# Patient Record
Sex: Female | Born: 1995
Health system: Southern US, Community
[De-identification: ages and names within clinical notes are randomized; demographics above are authoritative.]

## PROBLEM LIST (undated history)

## (undated) ENCOUNTER — Inpatient Hospital Stay (HOSPITAL_COMMUNITY): Payer: Self-pay

## (undated) DIAGNOSIS — F419 Anxiety disorder, unspecified: Secondary | ICD-10-CM

## (undated) DIAGNOSIS — N926 Irregular menstruation, unspecified: Secondary | ICD-10-CM

## (undated) DIAGNOSIS — O139 Gestational [pregnancy-induced] hypertension without significant proteinuria, unspecified trimester: Secondary | ICD-10-CM

## (undated) DIAGNOSIS — L700 Acne vulgaris: Secondary | ICD-10-CM

## (undated) DIAGNOSIS — F329 Major depressive disorder, single episode, unspecified: Secondary | ICD-10-CM

## (undated) DIAGNOSIS — Z8619 Personal history of other infectious and parasitic diseases: Secondary | ICD-10-CM

## (undated) DIAGNOSIS — F32A Depression, unspecified: Secondary | ICD-10-CM

## (undated) DIAGNOSIS — A749 Chlamydial infection, unspecified: Secondary | ICD-10-CM

## (undated) DIAGNOSIS — J301 Allergic rhinitis due to pollen: Secondary | ICD-10-CM

## (undated) DIAGNOSIS — K219 Gastro-esophageal reflux disease without esophagitis: Secondary | ICD-10-CM

## (undated) HISTORY — DX: Gestational (pregnancy-induced) hypertension without significant proteinuria, unspecified trimester: O13.9

## (undated) HISTORY — DX: Irregular menstruation, unspecified: N92.6

## (undated) HISTORY — DX: Anxiety disorder, unspecified: F41.9

## (undated) HISTORY — DX: Acne vulgaris: L70.0

## (undated) HISTORY — DX: Personal history of other infectious and parasitic diseases: Z86.19

## (undated) HISTORY — DX: Major depressive disorder, single episode, unspecified: F32.9

## (undated) HISTORY — DX: Depression, unspecified: F32.A

## (undated) HISTORY — DX: Gastro-esophageal reflux disease without esophagitis: K21.9

## (undated) HISTORY — DX: Allergic rhinitis due to pollen: J30.1

## (undated) HISTORY — DX: Chlamydial infection, unspecified: A74.9

## (undated) HISTORY — PX: TOE SURGERY: SHX1073

---
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
## 2014-01-04 ENCOUNTER — Ambulatory Visit (INDEPENDENT_AMBULATORY_CARE_PROVIDER_SITE_OTHER): Payer: Commercial Managed Care - PPO | Admitting: Nurse Practitioner

## 2014-01-04 ENCOUNTER — Encounter: Payer: Self-pay | Admitting: Nurse Practitioner

## 2014-01-04 ENCOUNTER — Ambulatory Visit: Payer: Commercial Managed Care - PPO | Admitting: Family Medicine

## 2014-01-04 VITALS — BP 80/50 | HR 66 | Temp 98.2°F | Ht 63.0 in | Wt 118.5 lb

## 2014-01-04 DIAGNOSIS — L0291 Cutaneous abscess, unspecified: Secondary | ICD-10-CM

## 2014-01-04 DIAGNOSIS — L0390 Cellulitis, unspecified: Secondary | ICD-10-CM

## 2014-01-04 DIAGNOSIS — R55 Syncope and collapse: Secondary | ICD-10-CM

## 2014-01-04 LAB — POCT URINALYSIS DIPSTICK
BILIRUBIN UA: NEGATIVE
Blood, UA: NEGATIVE
GLUCOSE UA: NEGATIVE
Ketones, UA: NEGATIVE
NITRITE UA: NEGATIVE
UROBILINOGEN UA: 0.2
pH, UA: 5.5

## 2014-01-04 MED ORDER — MUPIROCIN CALCIUM 2 % EX CREA: 1.0000 "application " | TOPICAL_CREAM | Freq: Two times a day (BID) | CUTANEOUS | Status: DC

## 2014-01-04 NOTE — Progress Notes (Signed)
   Subjective:    Patient ID: Heidi Shields, female    DOB: [birth year], 18 y.o.   MRN: [id number]  Rash This is a new problem. The current episode started yesterday (co-worker noticed lesion in ear last week.). The problem has been gradually worsening since onset. The affected locations include the right ear (pinna). The rash is characterized by redness, swelling and blistering. She was exposed to nothing. Pertinent negatives include no congestion, fatigue, fever or shortness of breath. Past treatments include nothing. There is no history of eczema. (Acne, taking doxycycline)      Review of Systems  Constitutional: Negative for fever, chills and fatigue.  HENT: Negative for congestion.   Respiratory: Negative for shortness of breath and wheezing.   Skin: Positive for rash.  Hematological: Negative for adenopathy.       Objective:   Physical Exam  Vitals reviewed. Constitutional: She is oriented to person, place, and time. She appears well-developed and well-nourished. She appears distressed (pt was not distressed until end of visit-i took paper work in room & asked if I could palpate lymph nodes-she became light-headed and I assisted her to the floor. She was unresponsive for a few seconds.).  HENT:  Head: Normocephalic and atraumatic.  Right Ear: Hearing normal.  Left Ear: Hearing normal.  Ears:  Moderate amount creumen both ears, medium brown, soft. Unable to fully visualize TM.  Eyes: Conjunctivae are normal. Right eye exhibits no discharge. Left eye exhibits no discharge.  Neck: Normal range of motion. Neck supple. No thyromegaly present.  No pre or post auricular LAD.  Cardiovascular: Normal rate, regular rhythm and normal heart sounds.   No murmur heard. Hypotensive: 90/50-80/50. Not tachycardic.  Pulmonary/Chest: Effort normal and breath sounds normal. No respiratory distress. She has no wheezes.  Genitourinary:  Dehydrated: SG >1.030, urine dark.  Lymphadenopathy:    She has no cervical adenopathy.  Neurological: She is alert and oriented to person, place, and time.  Appears to have had parasympathetic nerve response: light headed, followed by pallor, syncope -3-5 seconds, diaphoresis. Episode lasted about 10 minutes. POC CG 96. Pt given 1 piece candy and mountain dew. BP dropped to 80/50. Pulse initially dropped to 55, then back to 65 within 3-5 minutes minutes. Urine SG >1.030.  Skin: Skin is warm and dry. Rash noted.  See ear eval for lesion with cellulitis. Flaky skin noted R ear & 1 patch .5cm L forearm & R elbow.   Psychiatric: She has a normal mood and affect. Her behavior is normal. Thought content normal.          Assessment & Plan:  1. Cellulitis R pinna, looks like infected blackhead w/psoriasis overlap. - mupirocin cream (BACTROBAN) 2 %; Apply 1 application topically 2 (two) times daily.  Dispense: 15 g; Refill: 0  2. Syncope and collapse Pt became light headed after exam. BP 90/50 on arrival P 74. SG >1.030. POC Glucose 96. 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 Full recovery after 15 minutes.  DD: Arrythmia, SE of OBCP, anemia   - POCT CBG monitoring: 96 - POCT Urinalysis Dipstick: SG >1.030

## 2014-01-04 NOTE — Progress Notes (Signed)
Pre-visit discussion using our clinic review tool. No additional management support is needed unless otherwise documented below in the visit note.  

## 2014-01-04 NOTE — Patient Instructions (Addendum)
I think you have a pimple that has gotten infected. Use ointment 2 to 3 times daily on lesion. Wash ear at least once daily with soapy q tip-get in all crevices. You may apply 2 to 5% benzoyl peroxide in ear crevices where you have blackheads.   I think you passed out today for a combination reasons: dehydration and anxiety/fear that led to a parasympathetic nervous response. I think you can avoid this in the future if you stay hydrated. You need 4, 16 oz water/fluids daily (does not include caffienated tea or soda). If hydration does not correct the problem of low blood pressure we will run some tests. Let's see you back Monday. Nice to meet you!  Cellulitis Cellulitis is an infection of the skin and the tissue beneath it. The infected area is usually red and tender. Cellulitis occurs most often in the arms and lower legs.  CAUSES  Cellulitis is caused by bacteria that enter the skin through cracks or cuts in the skin. The most common types of bacteria that cause cellulitis are Staphylococcus and Streptococcus. SYMPTOMS   Redness and warmth.  Swelling.  Tenderness or pain.  Fever. DIAGNOSIS  Your caregiver can usually determine what is wrong based on a physical exam. Blood tests may also be done. TREATMENT  Treatment usually involves taking an antibiotic medicine. HOME CARE INSTRUCTIONS   Take your antibiotics as directed. Finish them even if you start to feel better.  Keep the infected arm or leg elevated to reduce swelling.  Apply a warm cloth to the affected area up to 4 times per day to relieve pain.  Only take over-the-counter or prescription medicines for pain, discomfort, or fever as directed by your caregiver.  Keep all follow-up appointments as directed by your caregiver. SEEK MEDICAL CARE IF:   You notice red streaks coming from the infected area.  Your red area gets larger or turns dark in color.  Your bone or joint underneath the infected area becomes painful after  the skin has healed.  Your infection returns in the same area or another area.  You notice a swollen bump in the infected area.  You develop new symptoms. SEEK IMMEDIATE MEDICAL CARE IF:   You have a fever.  You feel very sleepy.  You develop vomiting or diarrhea.  You have a general ill feeling (malaise) with muscle aches and pains. MAKE SURE YOU:   Understand these instructions.  Will watch your condition.  Will get help right away if you are not doing well or get worse. Document Released: 09/10/2005 Document Revised: 06/01/2012 Document Reviewed: 02/16/2012 [facility] Patient Information 2014 [place].  Dehydration, Adult Dehydration is when you lose more fluids from the body than you take in. Vital organs like the kidneys, brain, and heart cannot function without a proper amount of fluids and salt. Any loss of fluids from the body can cause dehydration.  CAUSES   Vomiting.  Diarrhea.  Excessive sweating.  Excessive urine output.  Fever. SYMPTOMS  Mild dehydration  Thirst.  Dry lips.  Slightly dry mouth. Moderate dehydration  Very dry mouth.  Sunken eyes.  Skin does not bounce back quickly when lightly pinched and released.  Dark urine and decreased urine production.  Decreased tear production.  Headache. Severe dehydration  Very dry mouth.  Extreme thirst.  Rapid, weak pulse (more than 100 beats per minute at rest).  Cold hands and feet.  Not able to sweat in spite of heat and temperature.  Rapid breathing.  Blue lips.  Confusion and lethargy.  Difficulty being awakened.  Minimal urine production.  No tears. DIAGNOSIS  Your caregiver will diagnose dehydration based on your symptoms and your exam. Blood and urine tests will help confirm the diagnosis. The diagnostic evaluation should also identify the cause of dehydration. TREATMENT  Treatment of mild or moderate dehydration can often be done at home by increasing the  amount of fluids that you drink. It is best to drink small amounts of fluid more often. Drinking too much at one time can make vomiting worse. Refer to the home care instructions below. Severe dehydration needs to be treated at the hospital where you will probably be given intravenous (IV) fluids that contain water and electrolytes. HOME CARE INSTRUCTIONS   Ask your caregiver about specific rehydration instructions.  Drink enough fluids to keep your urine clear or pale yellow.  Drink small amounts frequently if you have nausea and vomiting.  Eat as you normally do.  Avoid:  Foods or drinks high in sugar.  Carbonated drinks.  Juice.  Extremely hot or cold fluids.  Drinks with caffeine.  Fatty, greasy foods.  Alcohol.  Tobacco.  Overeating.  Gelatin desserts.  Wash your hands well to avoid spreading bacteria and viruses.  Only take over-the-counter or prescription medicines for pain, discomfort, or fever as directed by your caregiver.  Ask your caregiver if you should continue all prescribed and over-the-counter medicines.  Keep all follow-up appointments with your caregiver. SEEK MEDICAL CARE IF:  You have abdominal pain and it increases or stays in one area (localizes).  You have a rash, stiff neck, or severe headache.  You are irritable, sleepy, or difficult to awaken.  You are weak, dizzy, or extremely thirsty. SEEK IMMEDIATE MEDICAL CARE IF:   You are unable to keep fluids down or you get worse despite treatment.  You have frequent episodes of vomiting or diarrhea.  You have blood or green matter (bile) in your vomit.  You have blood in your stool or your stool looks black and tarry.  You have not urinated in 6 to 8 hours, or you have only urinated a small amount of very dark urine.  You have a fever.  You faint. MAKE SURE YOU:   Understand these instructions.  Will watch your condition.  Will get help right away if you are not doing well or  get worse. Document Released: 12/01/2005 Document Revised: 02/23/2012 Document Reviewed: 07/21/2011 [facility] Patient Information 2014 [place], [place].

## 2014-01-05 ENCOUNTER — Telehealth: Payer: Self-pay | Admitting: Nurse Practitioner

## 2014-01-05 ENCOUNTER — Other Ambulatory Visit: Payer: Self-pay | Admitting: *Deleted

## 2014-01-05 DIAGNOSIS — L0390 Cellulitis, unspecified: Secondary | ICD-10-CM

## 2014-01-05 MED ORDER — MUPIROCIN CALCIUM 2 % EX CREA: 1.0000 "application " | TOPICAL_CREAM | Freq: Two times a day (BID) | CUTANEOUS | Status: DC

## 2014-01-05 NOTE — Telephone Encounter (Signed)
Called pharmacy who stated that Bactroban cream rx from yesterday needs a PA. Switched rx to Bactroban ointment 22 g. If the ointment rx is not approved, pharmacy will let us know.

## 2014-01-05 NOTE — Telephone Encounter (Signed)
Patient was not able to pick up the medication for her ear. The pharmacy said they faxed us something & they are waiting for more information from our office. Please contact patient's mother to let her know if there is a problem with her insurance. She said it is okay to prescribe something else if you need to.

## 2014-01-05 NOTE — Telephone Encounter (Addendum)
Called pharmacy who stated that Bactroban rx from yesterday need s a PA. Switch rx to ointment 22 g. See other message.

## 2014-01-09 ENCOUNTER — Ambulatory Visit (INDEPENDENT_AMBULATORY_CARE_PROVIDER_SITE_OTHER): Payer: Commercial Managed Care - PPO | Admitting: Family Medicine

## 2014-01-09 ENCOUNTER — Encounter: Payer: Self-pay | Admitting: Family Medicine

## 2014-01-09 ENCOUNTER — Ambulatory Visit: Payer: Commercial Managed Care - PPO | Admitting: Family Medicine

## 2014-01-09 VITALS — BP 113/75 | HR 81 | Temp 99.2°F | Resp 16 | Ht 63.0 in | Wt 122.0 lb

## 2014-01-09 DIAGNOSIS — R55 Syncope and collapse: Secondary | ICD-10-CM

## 2014-01-09 DIAGNOSIS — L089 Local infection of the skin and subcutaneous tissue, unspecified: Secondary | ICD-10-CM

## 2014-01-09 DIAGNOSIS — H6120 Impacted cerumen, unspecified ear: Secondary | ICD-10-CM

## 2014-01-09 NOTE — Progress Notes (Signed)
OFFICE NOTE  01/09/2014  CC:  Chief Complaint  Patient presents with  . Follow-up    5 days      HPI: Patient is a [age] y.o. Caucasian female who is here for f/u right ear infected acne vulgaris lesion. Bactroban application almost daily--makes it itch.  She never had any pain.   Occ pain in right ear lately, feels clogged a lot. Doing better on doxycycline for her acne since 08/2013.  Has hx of feeling onset of lightheadedness, visual fields narrowing to tunnels, then passing out briefly.  These episodes consistently occur during times of stress/fear. Mom says she is active but when she comes home she "just lies on the couch".  Pt admits she feels tired and just wants to lay around.  Menses are regular-occurring for the most part, no excessive bleeding. She denies sexual activity and says there is no chance of her being pregnant.  Pertinent PMH:  Past Medical History  Diagnosis Date  . History of mononucleosis syndrome 2012  . Irregular menstrual cycle   . Acne vulgaris   . Hay fever     MEDS:  Outpatient Prescriptions Prior to Visit  Medication Sig Dispense Refill  . doxycycline (VIBRAMYCIN) 100 MG capsule Take 1 capsule (100 mg total) by mouth 2 (two) times daily.  60 capsule  6  . norethindrone-ethinyl estradiol (MICROGESTIN,JUNEL,LOESTRIN) 1-20 MG-MCG tablet 1 tab po qd x 21d, then no tabs for 7d, then start next pack  1 Package  11  . ibuprofen (ADVIL,MOTRIN) 200 MG tablet Take 400 mg by mouth once as needed. For pain      . mupirocin cream (BACTROBAN) 2 % Apply 1 application topically 2 (two) times daily.  15 g  0   No facility-administered medications prior to visit.    PE: Blood pressure 113/75, pulse 81, temperature 99.2 F (37.3 C), temperature source Temporal, resp. rate 16, height 5\' 3"  (1.6 m), weight 122 lb (55.339 kg), last menstrual period 12/21/2013, SpO2 100.00%. Gen: Alert, well appearing.  Patient is oriented to person, place, time, and situation. No  pallor or jaundice. ENT: Ears: Right ear with some flesh colored, slightly crusty papules near entrance to EAC, without erythema.  NO pustule or vesicle or tenderness.  Skin of face: rare small pimple on chin--non-erythematous. EACs show moderate cerumen on left, 75%-90% occlusion on right--minimal moist/brown cerumen removed with curette, then pt's ear had to be irrigated by nurse.  Most of the cerumen came out with this.  TMs with good light reflex and landmarks bilaterally.  Eyes: no injection, icteris, swelling, or exudate.  EOMI, PERRLA. Nose: no drainage or turbinate edema/swelling.  No injection or focal lesion.  Mouth: lips without lesion/swelling.  Oral mucosa pink and moist.  Dentition intact and without obvious caries or gingival swelling.  Oropharynx without erythema, exudate, or swelling.  Neck - No masses or thyromegaly or limitation in range of motion CV: RRR, no m/r/g.   LUNGS: CTA bilat, nonlabored resps, good aeration in all lung fields. EXT: no clubbing, cyanosis, or edema.  Radial pulses 2+ bilat.   IMPRESSION AND PLAN:  1) Infected acne pustule: resolving nicely with bactroban.  Finish tx with this. Continue vibramycin 100mg  bid for acne.  2) Cerumen impaction: improved ear discomfort after irrigation today in office.    3) Vasovagal syncope: discussed relatively benign nature of this disorder.  Discussed importance of drinking clear fluids regularly/staying hydrated. Encouraged her to start increasing activity level.  FOLLOW UP: prn

## 2014-03-02 ENCOUNTER — Encounter: Payer: Self-pay | Admitting: Family Medicine

## 2014-03-02 ENCOUNTER — Ambulatory Visit (INDEPENDENT_AMBULATORY_CARE_PROVIDER_SITE_OTHER): Payer: Commercial Managed Care - PPO | Admitting: Family Medicine

## 2014-03-02 VITALS — BP 107/71 | HR 77 | Temp 98.0°F | Resp 18 | Ht 63.0 in | Wt 123.0 lb

## 2014-03-02 DIAGNOSIS — L700 Acne vulgaris: Secondary | ICD-10-CM

## 2014-03-02 DIAGNOSIS — Z309 Encounter for contraceptive management, unspecified: Secondary | ICD-10-CM

## 2014-03-02 DIAGNOSIS — L708 Other acne: Secondary | ICD-10-CM

## 2014-03-02 NOTE — Progress Notes (Signed)
Pre visit review using our clinic review tool, if applicable. No additional management support is needed unless otherwise documented below in the visit note. 

## 2014-03-02 NOTE — Progress Notes (Signed)
OFFICE NOTE  03/02/2014  CC:  Chief Complaint  Patient presents with  . Follow-up     HPI: Patient is a 18 y.o. Caucasian female who is here for routine f/u for contraceptive mgmt, acne, hx of irregular menses. Feeling well.  Menses regular, not heavy.  She mentions no HA problems lately. Acne has cleared up nicely despite the fact that she takes her minocycline only a few times per week.  Pertinent PMH:  Past medical, surgical, social, and family history reviewed and no changes are noted since last office visit. Pt does not smoke.  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  MEDS:  Outpatient Prescriptions Prior to Visit  Medication Sig Dispense Refill  . doxycycline (VIBRAMYCIN) 100 MG capsule Take 1 capsule (100 mg total) by mouth 2 (two) times daily.  60 capsule  6  . norethindrone-ethinyl estradiol (MICROGESTIN,JUNEL,LOESTRIN) 1-20 MG-MCG tablet 1 tab po qd x 21d, then no tabs for 7d, then start next pack  1 Package  11  . ibuprofen (ADVIL,MOTRIN) 200 MG tablet Take 400 mg by mouth once as needed. For pain      . mupirocin cream (BACTROBAN) 2 % Apply 1 application topically 2 (two) times daily.  15 g  0   No facility-administered medications prior to visit.    PE: Blood pressure 107/71, pulse 77, temperature 98 F (36.7 C), temperature source Oral, resp. rate 18, height 5\' 3"  (1.6 m), weight 123 lb (55.792 kg), SpO2 100.00%. Gen: Alert, well appearing.  Patient is oriented to person, place, time, and situation. Face: no significant acne CV: RRR, no m/r/g.   LUNGS: CTA bilat, nonlabored resps, good aeration in all lung fields.   IMPRESSION AND PLAN:  1) OCP management: doing well. Therapeutic expectations and side effect profile of medication discussed today.  Patient's questions answered.  2) Acne: quiescent. May do trial completely off of doxy.  Restart previously prescribed topical acne therapy if acne begins to  return, then may restart doxy as 2nd line tx if topical not effective in 6-8wk trial.  An After Visit Summary was printed and given to the patient.  FOLLOW UP: 6 mo

## 2014-03-03 ENCOUNTER — Encounter: Payer: Self-pay | Admitting: Family Medicine

## 2014-03-24 ENCOUNTER — Telehealth: Payer: Self-pay | Admitting: Family Medicine

## 2014-03-24 NOTE — Telephone Encounter (Signed)
[facility]  I had already spoke to the pharmacist about this the last time they were in the office so the medication should be correct now.

## 2014-03-24 NOTE — Telephone Encounter (Signed)
Patients mother called because patient is wanting to have a birth control called in that includes the "sugar pills". Currently the patients pill pack only has 3 weeks worth of pills and she wants to have the sugar pills to take during her period week, call mother with questions, [facility] [place] and will start a new pack of Birth control on sunday

## 2014-03-24 NOTE — Telephone Encounter (Signed)
[facility]  BC is now the 28 day / month.  Patient's mom aware.

## 2014-04-19 ENCOUNTER — Other Ambulatory Visit: Payer: Self-pay

## 2014-04-19 MED ORDER — NORETHINDRONE ACET-ETHINYL EST 1-20 MG-MCG PO TABS: ORAL_TABLET | ORAL | Status: DC

## 2014-08-31 ENCOUNTER — Ambulatory Visit: Payer: Commercial Managed Care - PPO | Admitting: Family Medicine

## 2014-11-23 ENCOUNTER — Telehealth: Payer: Self-pay | Admitting: Family Medicine

## 2014-11-23 NOTE — Telephone Encounter (Signed)
Patient is calling about her birth control. She is asking why when she asks at the pharmacy about her refills they say she has unlimited but every time she needs them she has to call here. Please call patient back on her cell.

## 2014-11-23 NOTE — Telephone Encounter (Signed)
Patient called back & she found another Rx. She had an old Rx #. Patient said she doesn't need anyone to call her back.

## 2015-03-16 ENCOUNTER — Other Ambulatory Visit: Payer: Self-pay | Admitting: Family Medicine

## 2015-03-16 NOTE — Telephone Encounter (Signed)
I have already sent the message to Dr. Milinda CaveMcGowen.  I am awaiting response.

## 2015-03-16 NOTE — Telephone Encounter (Signed)
[facility] [place], patient has to start taking Rx on Sunday.

## 2015-03-16 NOTE — Telephone Encounter (Signed)
I will do RF of this for 2 more months, but I haven't seen her in a year so she needs to come in for a follow up visit sometime in the next 2 mo.-thx

## 2015-05-10 ENCOUNTER — Other Ambulatory Visit: Payer: Self-pay | Admitting: Family Medicine

## 2015-05-11 ENCOUNTER — Other Ambulatory Visit: Payer: Self-pay | Admitting: Family Medicine

## 2015-05-11 MED ORDER — NORETHINDRONE ACET-ETHINYL EST 1-20 MG-MCG PO TABS: ORAL_TABLET | ORAL | Status: DC

## 2015-05-11 NOTE — Telephone Encounter (Signed)
Pt advised and voiced understanding.   

## 2015-05-11 NOTE — Telephone Encounter (Signed)
Pls notify pt that I am doing a 1 mo supply with 2 RF's and she must f/u in office before she runs out of this supply of OCPs (I won't RF any further meds after this until f/u in office).

## 2015-05-11 NOTE — Telephone Encounter (Signed)
Please see message below. LOV: 03/02/14, no up coming ov, last written: 03/16/15 w/ 1RF. Please advise. Thanks.

## 2015-05-11 NOTE — Telephone Encounter (Signed)
[facility] & [facility] told her that she doesn't have any refills of Microgestin 1-20MG . They did the same thing last month too. Patient needs to start taking Rx on Sunday 5/29.

## 2015-05-30 ENCOUNTER — Ambulatory Visit (INDEPENDENT_AMBULATORY_CARE_PROVIDER_SITE_OTHER): Payer: Commercial Managed Care - PPO | Admitting: Family Medicine

## 2015-05-30 ENCOUNTER — Encounter: Payer: Self-pay | Admitting: Family Medicine

## 2015-05-30 VITALS — BP 109/70 | HR 89 | Temp 99.0°F | Resp 16 | Wt 122.0 lb

## 2015-05-30 DIAGNOSIS — N926 Irregular menstruation, unspecified: Secondary | ICD-10-CM

## 2015-05-30 DIAGNOSIS — Z3009 Encounter for other general counseling and advice on contraception: Secondary | ICD-10-CM

## 2015-05-30 MED ORDER — NORETHINDRONE ACET-ETHINYL EST 1-20 MG-MCG PO TABS: ORAL_TABLET | ORAL | Status: DC

## 2015-05-30 NOTE — Progress Notes (Signed)
OFFICE NOTE  05/30/2015  CC:  Chief Complaint  Patient presents with  . Follow-up    F/U meds. Pt is not fasting.      HPI: Patient is a 19 y.o. Caucasian female who is here for [facility] management f/u. LMP was 3 wks ago.  This came at the expected time, menses with regular intervals between them, light amount of bleeding x 4-5 days.  Has minimal dysmenorrhea sx's.  Says she is sexually active, uses no back up contraception, says there is no chance she is pregnant.  Pertinent PMH:  Past Medical History  Diagnosis Date  . History of mononucleosis syndrome 2012  . Irregular menstrual cycle   . Acne vulgaris   . 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   HS grad: [employer].   Lives with mom, dad, 2 sisters, and 1 brother in [place], [place].   Good student.   No extracurricular activities since stopping soccer in 10th grade.   No T/A/Ds.    MEDS: Note: she takes microgestin 1-20 x 21d, then takes 7d of sugar pills (NOT continuous hormone as listed below.   Not on doxycycline or bactroban listed below.  Outpatient Prescriptions Prior to Visit  Medication Sig Dispense Refill  . ibuprofen (ADVIL,MOTRIN) 200 MG tablet Take 400 mg by mouth once as needed. For pain    . norethindrone-ethinyl estradiol (MICROGESTIN) 1-20 MG-MCG tablet TAKE ONE TABLET BY MOUTH ONCE DAILY CONTINUOUSLY 21 tablet 2  . doxycycline (VIBRAMYCIN) 100 MG capsule Take 1 capsule (100 mg total) by mouth 2 (two) times daily. (Patient not taking: Reported on 05/30/2015) 60 capsule 6  . mupirocin cream (BACTROBAN) 2 % Apply 1 application topically 2 (two) times daily. (Patient not taking: Reported on 05/30/2015) 15 g 0   No facility-administered medications prior to visit.    PE: Blood pressure 109/70, pulse 89, temperature 99 F (37.2 C), temperature source Oral, resp. rate 16, weight 122 lb (55.339 kg), SpO2 98 %. Gen: Alert, well appearing.  Patient is  oriented to person, place, time, and situation. AFFECT: pleasant, lucid thought and speech. No further exam today.  IMPRESSION AND PLAN:  Contraceptive counseling, hx of metrorrhagia. Doing great on loestrin 1/20. Will send in new rx that specifies that I want her to have 21d hormone pills + 7d sugar pills (as opposed to 21d hormone pills and NO pills to take during menses week). Advised pt that her first pap is to be after she turns 19 yrs old.  An After Visit Summary was printed and given to the patient.  FOLLOW UP: 1 yr

## 2015-05-30 NOTE — Progress Notes (Signed)
Pre visit review using our clinic review tool, if applicable. No additional management support is needed unless otherwise documented below in the visit note. 

## 2015-06-06 ENCOUNTER — Telehealth: Payer: Self-pay | Admitting: Family Medicine

## 2015-06-06 NOTE — Telephone Encounter (Signed)
Pt called stating that her BC still does not contain sugar pills.   Spoke to pharmacist and she states the only microgestin that contains sugar pills is microgestin FE.  Can patient take that instead of regular?  Please advise.

## 2015-06-11 NOTE — Telephone Encounter (Signed)
YES--ok to take microgestin FE.-thx

## 2015-06-11 NOTE — Telephone Encounter (Signed)
Spoke to pharmacist and had Rx changed.  Pt aware.

## 2015-07-19 ENCOUNTER — Telehealth: Payer: Self-pay | Admitting: *Deleted

## 2015-07-19 NOTE — Telephone Encounter (Signed)
Pt called stating that she started the new BC with sugar pills. She stated that since she has started the new [facility] but her cycle still has not stopped. She stated that it will slow down then pick back up. She stated that this is the 3rd week of her cycle. She wants to know what why this is happening and what Dr. Milinda Cave recommends. Please advise. Thanks.

## 2015-07-20 NOTE — Telephone Encounter (Signed)
Pt advised and voiced understanding.   

## 2015-07-20 NOTE — Telephone Encounter (Signed)
Pt LMOM on 07/20/15 at 9:19am, calling back in regards to message below please advise. Thanks.

## 2015-07-20 NOTE — Telephone Encounter (Signed)
Tell her she needs to check an OTC urine pregnancy test if she has not already done so. Reassure her that there is nothing different about her most recently prescribed OCP, and that some periods are simply irregular for unclear reasons (stress is most common one).  Tell her to continue the pills like she has been directed to take them and if the bleeding gets heavy she can come in and we can check a blood count.  Otherwise she will have to give this a cycle or two to see if it straightens out.-thx

## 2016-03-31 ENCOUNTER — Telehealth: Payer: Self-pay | Admitting: Family Medicine

## 2016-03-31 MED ORDER — NORETHINDRONE ACET-ETHINYL EST 1-20 MG-MCG PO TABS: ORAL_TABLET | ORAL | Status: DC
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
## 2016-03-31 NOTE — Telephone Encounter (Signed)
Spoke to pharmacy they stated that pt has one refill left on her current [facility]. They will go ahead and get this ready for her but did state that she will have to pay out of pocket since it is an early refill. Pt has apt in June to see Dr. Milinda CaveMcGowen and needs another month Rxed to her pharmacy to get her by til then. Rx sent. Pt advised and voiced understanding.

## 2016-05-29 ENCOUNTER — Ambulatory Visit (INDEPENDENT_AMBULATORY_CARE_PROVIDER_SITE_OTHER): Payer: Commercial Managed Care - PPO | Admitting: Family Medicine

## 2016-05-29 ENCOUNTER — Encounter: Payer: Self-pay | Admitting: Family Medicine

## 2016-05-29 VITALS — BP 112/77 | HR 76 | Temp 98.5°F | Resp 16 | Ht 63.0 in | Wt 143.5 lb

## 2016-05-29 DIAGNOSIS — R635 Abnormal weight gain: Secondary | ICD-10-CM | POA: Diagnosis not present

## 2016-05-29 DIAGNOSIS — R5382 Chronic fatigue, unspecified: Secondary | ICD-10-CM | POA: Diagnosis not present

## 2016-05-29 DIAGNOSIS — Z0000 Encounter for general adult medical examination without abnormal findings: Secondary | ICD-10-CM | POA: Insufficient documentation

## 2016-05-29 LAB — COMPREHENSIVE METABOLIC PANEL
ALK PHOS: 50 U/L (ref 39–117)
ALT: 14 U/L (ref 0–35)
AST: 13 U/L (ref 0–37)
Albumin: 4.6 g/dL (ref 3.5–5.2)
BILIRUBIN TOTAL: 0.4 mg/dL (ref 0.2–1.2)
BUN: 13 mg/dL (ref 6–23)
CO2: 30 mEq/L (ref 19–32)
CREATININE: 0.63 mg/dL (ref 0.40–1.20)
Calcium: 9.9 mg/dL (ref 8.4–10.5)
Chloride: 104 mEq/L (ref 96–112)
GFR: 127.4 mL/min (ref >60.00)
GLUCOSE: 78 mg/dL (ref 70–99)
Potassium: 4 mEq/L (ref 3.5–5.1)
SODIUM: 140 meq/L (ref 135–145)
TOTAL PROTEIN: 7.8 g/dL (ref 6.0–8.3)

## 2016-05-29 LAB — CBC WITH DIFFERENTIAL/PLATELET
BASOS ABS: 0 10*3/uL (ref 0.0–0.1)
Basophils Relative: 0.5 % (ref 0.0–3.0)
EOS ABS: 0.4 10*3/uL (ref 0.0–0.7)
EOS PCT: 5.7 % — AB (ref 0.0–5.0)
HCT: 43.4 % (ref 36.0–46.0)
HEMOGLOBIN: 14.9 g/dL (ref 12.0–15.0)
Lymphocytes Relative: 26 % (ref 12.0–46.0)
Lymphs Abs: 2 10*3/uL (ref 0.7–4.0)
MCHC: 34.2 g/dL (ref 30.0–36.0)
MCV: 87.5 fl (ref 78.0–100.0)
MONO ABS: 0.7 10*3/uL (ref 0.1–1.0)
Monocytes Relative: 9.4 % (ref 3.0–12.0)
Neutro Abs: 4.5 10*3/uL (ref 1.4–7.7)
Neutrophils Relative %: 58.4 % (ref 43.0–77.0)
Platelets: 260 10*3/uL (ref 150.0–400.0)
RBC: 4.96 Mil/uL (ref 3.87–5.11)
RDW: 12.4 % (ref 11.5–14.6)
WBC: 7.6 10*3/uL (ref 4.5–10.5)

## 2016-05-29 LAB — TSH: TSH: 2.08 u[IU]/mL (ref 0.35–5.50)

## 2016-05-29 MED ORDER — NORETHINDRONE ACET-ETHINYL EST 1-20 MG-MCG PO TABS: ORAL_TABLET | ORAL | Status: DC

## 2016-05-29 NOTE — Progress Notes (Signed)
Pre visit review using our clinic review tool, if applicable. No additional management support is needed unless otherwise documented below in the visit note. 

## 2016-05-29 NOTE — Progress Notes (Signed)
Office Note 05/29/2016  CC:  Chief Complaint  Patient presents with  . Annual Exam    Pt is not fasting.    HPI:  Heidi Shields is a 20 y.o. White female who is here for annual health maintenance exam. 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 Working at [employer] in [place] and a fast -[employer]. She has been taking OCPs, last pill was 5 days ago.  She started her menstrual period 2 days ago. Has been having regular menses.  She is sexually active.   No back up contraception is used. She has gained 20 lbs over the last 1 yr. Says she is tired a lot--is ok at work and school but when she gets home all she wants to do is sleep.  Denies depression.   Past Medical History  Diagnosis Date  . History of mononucleosis syndrome 2012  . Irregular menstrual cycle   . Acne vulgaris   . Hay fever     History reviewed. No pertinent past surgical history.  Family History  Problem Relation Age of Onset  . Breast cancer Other     Aunt  . High blood pressure Other     Grandparents  . Diabetes Father     Social History   Social History  . Marital Status: Single    Spouse Name: N/A  . Number of Children: N/A  . Years of Education: N/A   Occupational History  . Not on file.   Social History Main Topics  . Smoking status: Never Smoker   . Smokeless tobacco: Not on file  . Alcohol Use: Not on file  . Drug Use: Not on file  . Sexual Activity: Not on file   Other Topics Concern  . 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   HS grad: [employer].   Lives with mom, dad, 2 sisters, and 1 brother in [place], [place].   Good student.   No extracurricular activities since stopping soccer in 10th grade.   No T/A/Ds.    Outpatient Prescriptions Prior to Visit  Medication Sig Dispense Refill  . ibuprofen (ADVIL,MOTRIN) 200 MG tablet Take 400 mg by mouth once as needed. For pain     . norethindrone-ethinyl estradiol (MICROGESTIN) 1-20 MG-MCG tablet TAKE ONE TABLET BY MOUTH ONCE DAILY CONTINUOUSLY 28 tablet 0   No facility-administered medications prior to visit.    No Known Allergies  ROS Review of Systems  Constitutional: Positive for fatigue. Negative for fever, chills and appetite change.  HENT: Negative for congestion, dental problem, ear pain and sore throat.   Eyes: Negative for discharge, redness and visual disturbance.  Respiratory: Negative for cough, chest tightness, shortness of breath and wheezing.   Cardiovascular: Negative for chest pain, palpitations and leg swelling.  Gastrointestinal: Negative for nausea, vomiting, abdominal pain, diarrhea and blood in stool.  Endocrine: Positive for heat intolerance (pt unsure).  Genitourinary: Negative for dysuria, urgency, frequency, hematuria, flank pain and difficulty urinating.  Musculoskeletal: Negative for myalgias, back pain, joint swelling, arthralgias and neck stiffness.  Skin: Negative for pallor and rash.  Neurological: Negative for dizziness, speech difficulty, weakness and headaches.  Hematological: Negative for adenopathy. Does not bruise/bleed easily.  Psychiatric/Behavioral: Negative for confusion and sleep disturbance. The patient is not nervous/anxious.     PE; Blood pressure 112/77, pulse 76, temperature 98.5 F (36.9 C), temperature source Oral, resp. rate 16,  height 5\' 3"  (1.6 m), weight 143 lb 8 oz (65.091 kg), last menstrual period 05/27/2016, SpO2 100 %.  Pt examined with Wallace KellerHeather Kirby, CMA, as chaperone. Gen: Alert, well appearing.  Patient is oriented to person, place, time, and situation. AFFECT: pleasant, lucid thought and speech. ENT: Ears: EACs clear, normal epithelium.  TMs with good light reflex and landmarks bilaterally.  Eyes: no injection, icteris, swelling, or exudate.  EOMI, PERRLA. Nose: no drainage or turbinate edema/swelling.  No injection or focal lesion.  Mouth: lips  without lesion/swelling.  Oral mucosa pink and moist.  Dentition intact and without obvious caries or gingival swelling.  Oropharynx without erythema, exudate, or swelling.  Neck: supple/nontender.  No LAD, mass, or TM.  Carotid pulses 2+ bilaterally, without bruits. CV: RRR, no m/r/g.   LUNGS: CTA bilat, nonlabored resps, good aeration in all lung fields. ABD: soft, NT, ND, BS normal.  No hepatospenomegaly or mass.  No bruits. EXT: no clubbing, cyanosis, or edema.  Musculoskeletal: no joint swelling, erythema, warmth, or tenderness.  ROM of all joints intact. Skin - no sores or suspicious lesions or rashes or color changes  Pertinent labs:  None today  ASSESSMENT AND PLAN:   Health maintenance exam: Reviewed age and gender appropriate health maintenance issues (prudent diet, regular exercise, health risks of tobacco and excessive alcohol, use of seatbelts, fire alarms in home, use of sunscreen).  Also reviewed age and gender appropriate health screening as well as vaccine recommendations.  Recently got some vaccines for her school but she can't recall what--we'll ask for records. I think her fatigue is secondary to her busy schedule: 2 jobs plus school. Wt gain most likely from lack of CV exercise and increased caloric intake working at [employer]. Will check TSH, CMET, and CBC today. RF'd OCPs.  An After Visit Summary was printed and given to the patient.  FOLLOW UP:  Return in about 1 year (around 05/29/2017) for annual CPE (fasting).  Signed:  Santiago BumpersPhil McGowen, MD           05/29/2016

## 2016-08-28 ENCOUNTER — Ambulatory Visit: Payer: Commercial Managed Care - PPO | Admitting: Family Medicine

## 2016-10-03 ENCOUNTER — Other Ambulatory Visit: Payer: Self-pay | Admitting: Family Medicine

## 2016-10-03 ENCOUNTER — Telehealth: Payer: Self-pay | Admitting: Family Medicine

## 2016-10-03 MED ORDER — NORGESTIMATE-ETH ESTRADIOL 0.25-35 MG-MCG PO TABS: 1.0000 | ORAL_TABLET | Freq: Every day | ORAL | 11 refills | Status: DC

## 2016-10-03 NOTE — Telephone Encounter (Signed)
OK, will send in Rx for Sprintec.

## 2016-10-03 NOTE — Telephone Encounter (Signed)
Patient calling to request to change her birth control to either Sprintec or Tri-Sprintec due to cost.  Pharmacy:  [facility].

## 2016-11-12 ENCOUNTER — Telehealth: Payer: Self-pay | Admitting: Family Medicine

## 2016-11-12 NOTE — Telephone Encounter (Signed)
Please advise. Thanks.  

## 2016-11-12 NOTE — Telephone Encounter (Signed)
Ov needed 

## 2016-11-12 NOTE — Telephone Encounter (Signed)
Pt is calling with a question about her birth control medication. Does not feel like her Sprintec is working. She switched to a cheaper brand bc of loss of insurance. Started 3 days early last month and this month has had extremely heavy bleeding that started in the middle of the night coupled with vomiting.

## 2016-11-12 NOTE — Telephone Encounter (Signed)
Pt advised and voiced understanding.  She stated that she will call back to schedule apt once her insurance is active.

## 2017-04-15 ENCOUNTER — Ambulatory Visit (HOSPITAL_BASED_OUTPATIENT_CLINIC_OR_DEPARTMENT_OTHER)
Admission: RE | Admit: 2017-04-15 | Discharge: 2017-04-15 | Disposition: A | Discharge status: Home / Self Care | Payer: BLUE CROSS/BLUE SHIELD | Source: Ambulatory Visit | Attending: Family Medicine | Admitting: Family Medicine

## 2017-04-15 ENCOUNTER — Encounter: Payer: Self-pay | Admitting: Family Medicine

## 2017-04-15 ENCOUNTER — Ambulatory Visit: Payer: BLUE CROSS/BLUE SHIELD | Admitting: Family Medicine

## 2017-04-15 ENCOUNTER — Other Ambulatory Visit: Payer: Self-pay | Admitting: Family Medicine

## 2017-04-15 VITALS — BP 125/79 | HR 120 | Temp 97.8°F | Resp 16 | Ht 63.0 in | Wt 139.5 lb

## 2017-04-15 DIAGNOSIS — F410 Panic disorder [episodic paroxysmal anxiety] without agoraphobia: Secondary | ICD-10-CM

## 2017-04-15 DIAGNOSIS — R197 Diarrhea, unspecified: Secondary | ICD-10-CM

## 2017-04-15 DIAGNOSIS — R112 Nausea with vomiting, unspecified: Secondary | ICD-10-CM | POA: Diagnosis not present

## 2017-04-15 DIAGNOSIS — F321 Major depressive disorder, single episode, moderate: Secondary | ICD-10-CM | POA: Diagnosis not present

## 2017-04-15 DIAGNOSIS — R1110 Vomiting, unspecified: Secondary | ICD-10-CM | POA: Diagnosis not present

## 2017-04-15 DIAGNOSIS — R000 Tachycardia, unspecified: Secondary | ICD-10-CM | POA: Diagnosis not present

## 2017-04-15 DIAGNOSIS — R1030 Lower abdominal pain, unspecified: Secondary | ICD-10-CM | POA: Diagnosis not present

## 2017-04-15 LAB — CBC WITH DIFFERENTIAL/PLATELET
Basophils Absolute: 0 10*3/uL (ref 0.0–0.1)
Basophils Relative: 0.4 % (ref 0.0–3.0)
Eosinophils Absolute: 0.1 10*3/uL (ref 0.0–0.7)
Eosinophils Relative: 1.4 % (ref 0.0–5.0)
HEMATOCRIT: 42.1 % (ref 36.0–46.0)
Hemoglobin: 14.8 g/dL (ref 12.0–15.0)
LYMPHS ABS: 1.4 10*3/uL (ref 0.7–4.0)
Lymphocytes Relative: 15.7 % (ref 12.0–46.0)
MCHC: 35.1 g/dL (ref 30.0–36.0)
MCV: 86 fl (ref 78.0–100.0)
Monocytes Absolute: 0.8 10*3/uL (ref 0.1–1.0)
Monocytes Relative: 9.1 % (ref 3.0–12.0)
NEUTROS ABS: 6.6 10*3/uL (ref 1.4–7.7)
Neutrophils Relative %: 73.4 % (ref 43.0–77.0)
Platelets: 266 10*3/uL (ref 150.0–400.0)
RBC: 4.9 Mil/uL (ref 3.87–5.11)
RDW: 12.8 % (ref 11.5–15.5)
WBC: 9.1 10*3/uL (ref 4.0–10.5)

## 2017-04-15 LAB — H. PYLORI ANTIBODY, IGG: H Pylori IgG: NEGATIVE

## 2017-04-15 LAB — POCT URINALYSIS DIPSTICK
Glucose, UA: NEGATIVE
NITRITE UA: NEGATIVE
PH UA: 6.5 (ref 5.0–8.0)
Spec Grav, UA: 1.02 (ref 1.010–1.025)
Urobilinogen, UA: 1 E.U./dL

## 2017-04-15 LAB — COMPREHENSIVE METABOLIC PANEL
ALBUMIN: 5 g/dL (ref 3.5–5.2)
ALT: 24 U/L (ref 0–35)
AST: 14 U/L (ref 0–37)
Alkaline Phosphatase: 39 U/L (ref 39–117)
BILIRUBIN TOTAL: 1.3 mg/dL — AB (ref 0.2–1.2)
BUN: 10 mg/dL (ref 6–23)
CO2: 25 mEq/L (ref 19–32)
Calcium: 10.5 mg/dL (ref 8.4–10.5)
Chloride: 104 mEq/L (ref 96–112)
Creatinine, Ser: 0.65 mg/dL (ref 0.40–1.20)
GFR: 121.84 mL/min (ref >60.00)
Glucose, Bld: 95 mg/dL (ref 70–99)
POTASSIUM: 3.9 meq/L (ref 3.5–5.1)
Sodium: 140 mEq/L (ref 135–145)
Total Protein: 8.1 g/dL (ref 6.0–8.3)

## 2017-04-15 LAB — POCT URINE PREGNANCY: Preg Test, Ur: NEGATIVE

## 2017-04-15 LAB — TSH: TSH: 0.84 u[IU]/mL (ref 0.35–4.50)

## 2017-04-15 MED ORDER — PROMETHAZINE HCL 12.5 MG RE SUPP: 12.5000 mg | Freq: Four times a day (QID) | RECTAL | 0 refills | Status: DC | PRN

## 2017-04-15 MED ORDER — PROMETHAZINE HCL 12.5 MG PO TABS: ORAL_TABLET | ORAL | 0 refills | Status: DC

## 2017-04-15 MED ORDER — SULFAMETHOXAZOLE-TRIMETHOPRIM 800-160 MG PO TABS: 1.0000 | ORAL_TABLET | Freq: Two times a day (BID) | ORAL | 0 refills | Status: DC

## 2017-04-15 MED ORDER — PROMETHAZINE HCL 25 MG/ML IJ SOLN
25.0000 mg | Freq: Once | INTRAMUSCULAR | Status: AC
  Administered 2017-04-15: 25 mg via INTRAMUSCULAR

## 2017-04-15 MED ORDER — IOPAMIDOL (ISOVUE-300) INJECTION 61%
100.0000 mL | Freq: Once | INTRAVENOUS | Status: AC | PRN
  Administered 2017-04-15: 100 mL via INTRAVENOUS

## 2017-04-15 MED ORDER — DULOXETINE HCL 30 MG PO CPEP: 30.0000 mg | ORAL_CAPSULE | Freq: Every day | ORAL | 0 refills | Status: DC

## 2017-04-15 NOTE — Progress Notes (Signed)
Pre visit review using our clinic review tool, if applicable. No additional management support is needed unless otherwise documented below in the visit note. 

## 2017-04-15 NOTE — Progress Notes (Signed)
OFFICE VISIT  04/15/2017   CC:  Chief Complaint  Patient presents with  . Emesis    nausea started last night    HPI:    Patient is a 21 y.o. Caucasian female who presents for gastrointestinal symptoms. She is crying while giving history, very anxious.  Not feeling well for last 1 mo, not eating much at all, nausea every day, stomach hurts right after eating (midline, above belly button)--says she feels an intense anxiety for 30 min to 1 hr.  Peak anxiety in 5-10 min.  +Depressed mood.  +Anhedonia.  Crying spells.  Withdrawing socially.  Restless at night, sleeps a lot in day if not working.    Last night she woke up with a bad headache, started vomiting, felt lower abd pain, then had loose BMs. Vomited many times, most recently about 4 hours ago, but still very nauseated.  Has not tried to drink fluids since last emesis, though. Abd pain has decreased a little since onset but is still present.  Worse in LLQ. For the last week she has had 3 watery BMs per day.  No abd cramping during that time---the cramping just started last night in L lower abdomen.  Lots of gas lately as well.  No fever.  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  Also not happy in her relationship. She feels safe in her relationship.  No self medicating with drugs or alcohol.   She says she feels like she is having an anxiety attack now.  Denies past hx of panic attacks or depression.  LMP started 4/a--and this came when expected.  Currently sexually active.   No urinary urgency, burning, or frequency.  Says urine output is "less".  No meds have been taken for her symptoms.  Past Medical History:  Diagnosis Date  . Acne vulgaris   . Hay fever   . History of mononucleosis syndrome 2012  . Irregular menstrual cycle     History reviewed. No pertinent surgical history.  Outpatient Medications Prior to Visit  Medication Sig Dispense Refill  . ibuprofen (ADVIL,MOTRIN) 200 MG tablet Take  400 mg by mouth once as needed. For pain    . norgestimate-ethinyl estradiol (ORTHO-CYCLEN,SPRINTEC,PREVIFEM) 0.25-35 MG-MCG tablet Take 1 tablet by mouth daily. 1 Package 11   No facility-administered medications prior to visit.     No Known Allergies  ROS As per HPI  PE: Blood pressure 125/79, pulse (!) 120, temperature 97.8 F (36.6 C), temperature source Oral, resp. rate 16, height '5\' 3"'$  (1.6 m), weight 139 lb 8 oz (63.3 kg), last menstrual period 04/12/2017, SpO2 100 %. Gen: Alert, tired appearing, NAD.  Patient is oriented to person, place, time, and situation. TKZ:SWFU: no injection, icteris, swelling, or exudate.  EOMI, PERRLA. Mouth: lips without lesion/swelling.  Oral mucosa pink and moist. Oropharynx without erythema, exudate, or swelling.  CV: RRR, no m/r/g.   LUNGS: CTA bilat, nonlabored resps, good aeration in all lung fields. ABD: nondistended, BS normal, significant TTP in lower abd, RLQ the most. No guarding or rebound. EXT: no clubbing, cyanosis, or edema.  Skin - no sores or suspicious lesions or rashes or color changes   LABS:  CC UA today: moderate blood, small LEU, + ketones, SG 1.020, o/w normal. UPT today: NEG  IMPRESSION AND PLAN:  1) Acute worsening of nausea, now with vomiting (intractable), with lower abd pain diffusely, worse in RLQ and LLQ. CT abd and pelv with contrast, r/o appendicitis. Lower suspicion of diverticulitis,  colitis. TSH, CBC, CMET, H pylori Ab, UPT, UA. Suspect her urine today is contaminated due to currently menstruating. Send for c/s--no abx at this time. Stool testing--will give pt stool collection kit. Rx'd oral phenergan 12.'5mg'$  tabs as well as suppositories. Recommended otc imodium to decrease watery BMs but not stop them altogether. Discussed oral rehydration and gradual advancement of diet. Gave '25mg'$  IM phenergan in office so she could tolerate oral contrast--they will give the oral contrast at radiology today. Pt's  brother came to pick her up and drive her to radiology[facility].  2) Diarrhea x 1 week: suspect functional, but will obtain stool samples for C diff, giardia/crypto, culture, and lactoferrin. Imodium as noted above.  3) Current MDD, moderate.  Also adjustment d/o with anxiety/panic attacks. If all testing for organic cause of her symptoms, will start antidepressant and have her f/u in 1 week.  An After Visit Summary was printed and given to the patient.  Spent 60 min with pt today, with >50% of this time spent in counseling and care coordination regarding the above problems.  FOLLOW UP: Return in about 1 week (around 04/22/2017).  Signed:  Crissie Sickles, MD           04/15/2017

## 2017-04-16 LAB — URINE CULTURE

## 2017-05-29 ENCOUNTER — Ambulatory Visit (INDEPENDENT_AMBULATORY_CARE_PROVIDER_SITE_OTHER): Payer: BLUE CROSS/BLUE SHIELD | Admitting: Family Medicine

## 2017-05-29 ENCOUNTER — Encounter: Payer: Self-pay | Admitting: Family Medicine

## 2017-05-29 VITALS — BP 101/68 | HR 78 | Temp 97.9°F | Resp 16 | Ht 63.5 in | Wt 139.2 lb

## 2017-05-29 DIAGNOSIS — Z124 Encounter for screening for malignant neoplasm of cervix: Secondary | ICD-10-CM | POA: Diagnosis not present

## 2017-05-29 NOTE — Progress Notes (Signed)
Office Note 05/29/2017  CC:  Chief Complaint  Patient presents with  . Annual Exam    Pt is fasting.     HPI:  Heidi Shields is a 21 y.o. White female who is here for annual health maintenance exam. After her most recent visit when she was depressed and overwhelmed with anxiety, we started her on duloxetine 30mg  qd. She missed her next f/u visit.  She took this med about 10d.  She moved back home and felt a lot better so she stopped the med. Still with same job.  Says things are a lot more stable emotionally/mentally.  She takes her oral contraceptive every day.  LMP was 3 weeks ago.   Says she is not sexually active.  These help keep her menses regular. We discussed the fact that age 72 is recommended age to start getting pap/pelvic exams--she requested referral to [facility] in [place].  Diet: describes it as poor. Exercise: just joined [employer]. Dental preventatives UTD.   Past Medical History:  Diagnosis Date  . Acne vulgaris   . Anxiety and depression   . Hay fever   . History of mononucleosis syndrome 2012  . Irregular menstrual cycle     History reviewed. No pertinent surgical history.  Family History  Problem Relation Age of Onset  . Breast cancer Other        Aunt  . High blood pressure Other        Grandparents  . Diabetes Father     Social History   Social History  . Marital status: Single    Spouse name: N/A  . Number of children: N/A  . Years of education: N/A   Occupational History  . Not on file.   Social History Main Topics  . Smoking status: Never Smoker  . Smokeless tobacco: Never Used  . Alcohol use Not on file  . Drug use: Unknown  . Sexual activity: Not on file   Other Topics Concern  . 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   HS grad: [employer].   Lives with mom, dad, 2 sisters, and 1 brother in [place], [place].   Good student.   No extracurricular  activities since stopping soccer in 10th grade.   No T/A/Ds.    Outpatient Medications Prior to Visit  Medication Sig Dispense Refill  . ibuprofen (ADVIL,MOTRIN) 200 MG tablet Take 400 mg by mouth once as needed. For pain    . norgestimate-ethinyl estradiol (ORTHO-CYCLEN,SPRINTEC,PREVIFEM) 0.25-35 MG-MCG tablet Take 1 tablet by mouth daily. 1 Package 11  . DULoxetine (CYMBALTA) 30 MG capsule Take 1 capsule (30 mg total) by mouth daily. (Patient not taking: Reported on 05/29/2017) 30 capsule 0  . promethazine (PHENERGAN) 12.5 MG suppository Place 1 suppository (12.5 mg total) rectally every 6 (six) hours as needed for nausea or vomiting. 12 suppository 0  . promethazine (PHENERGAN) 12.5 MG tablet 1-2 tabs po q6h prn 30 tablet 0   No facility-administered medications prior to visit.     No Known Allergies  ROS Review of Systems  Constitutional: Negative for appetite change, chills, fatigue and fever.  HENT: Positive for rhinorrhea. Negative for congestion, dental problem, ear pain and sore throat.   Eyes: Negative for discharge, redness and visual disturbance.  Respiratory: Positive for cough. Negative for chest tightness, shortness of breath and wheezing.   Cardiovascular: Negative for chest pain, palpitations and leg swelling.  Gastrointestinal:  Negative for abdominal pain, blood in stool, diarrhea, nausea and vomiting.  Genitourinary: Negative for difficulty urinating, dysuria, flank pain, frequency, hematuria and urgency.  Musculoskeletal: Negative for arthralgias, back pain, joint swelling, myalgias and neck stiffness.  Skin: Negative for pallor and rash.  Neurological: Negative for dizziness, speech difficulty, weakness and headaches.  Hematological: Negative for adenopathy. Does not bruise/bleed easily.  Psychiatric/Behavioral: Negative for confusion and sleep disturbance. The patient is not nervous/anxious.    PE; Blood pressure 101/68, pulse 78, temperature 97.9 F (36.6 C),  temperature source Oral, resp. rate 16, height 5' 3.5" (1.613 m), weight 139 lb 4 oz (63.2 kg), last menstrual period 05/08/2017, SpO2 99 %. Body mass index is 24.28 kg/m. Pt examined with Wallace KellerHeather Kirby, CMA, as chaperone.  Gen: Alert, well appearing.  Patient is oriented to person, place, time, and situation. AFFECT: pleasant, lucid thought and speech. ENT: Ears: EACs clear, normal epithelium.  TMs with good light reflex and landmarks bilaterally.  Eyes: no injection, icteris, swelling, or exudate.  EOMI, PERRLA. Nose: no drainage or turbinate edema/swelling.  No injection or focal lesion.  Mouth: lips without lesion/swelling.  Oral mucosa pink and moist.  Dentition intact and without obvious caries or gingival swelling.  Oropharynx without erythema, exudate, or swelling.  Neck: supple/nontender.  No LAD, mass, or TM.  Carotid pulses 2+ bilaterally, without bruits. CV: RRR, no m/r/g.   LUNGS: CTA bilat, nonlabored resps, good aeration in all lung fields. ABD: soft, NT, ND, BS normal.  No hepatospenomegaly or mass.  No bruits. EXT: no clubbing, cyanosis, or edema.  Musculoskeletal: no joint swelling, erythema, warmth, or tenderness.  ROM of all joints intact. Skin - no sores or suspicious lesions or rashes or color changes   Pertinent labs:  Lab Results  Component Value Date   TSH 0.84 04/15/2017   Lab Results  Component Value Date   WBC 9.1 04/15/2017   HGB 14.8 04/15/2017   HCT 42.1 04/15/2017   MCV 86.0 04/15/2017   PLT 266.0 04/15/2017   Lab Results  Component Value Date   CREATININE 0.65 04/15/2017   BUN 10 04/15/2017   NA 140 04/15/2017   K 3.9 04/15/2017   CL 104 04/15/2017   CO2 25 04/15/2017   Lab Results  Component Value Date   ALT 24 04/15/2017   AST 14 04/15/2017   ALKPHOS 39 04/15/2017   BILITOT 1.3 (H) 04/15/2017    ASSESSMENT AND PLAN:   Health maintenance exam: Reviewed age and gender appropriate health maintenance issues (prudent diet, regular  exercise, health risks of tobacco and excessive alcohol, use of seatbelts, fire alarms in home, use of sunscreen).  Also reviewed age and gender appropriate health screening as well as vaccine recommendations. Continue OCP for irregular menses. Refer to GYN for cervical ca screening--per pt request. No labs due today. Vaccines: all UTD except she technically needs hep A #2 to be fully immunized for this--she declined this vaccine today. Recent adjustment disorder with anxiety and depression features: improving, no meds at this time.  An After Visit Summary was printed and given to the patient.  FOLLOW UP:  Return in about 1 year (around 05/29/2018) for annual CPE (fasting).  Signed:  Santiago BumpersPhil Rinaldo Macqueen, MD           05/29/2017

## 2017-05-29 NOTE — Patient Instructions (Signed)

## 2017-06-08 ENCOUNTER — Encounter: Payer: Self-pay | Admitting: Obstetrics & Gynecology

## 2017-07-07 ENCOUNTER — Other Ambulatory Visit: Payer: Self-pay | Admitting: Obstetrics & Gynecology

## 2017-07-21 ENCOUNTER — Encounter: Payer: Self-pay | Admitting: Obstetrics & Gynecology

## 2017-07-21 ENCOUNTER — Other Ambulatory Visit (HOSPITAL_COMMUNITY)
Admission: RE | Admit: 2017-07-21 | Discharge: 2017-07-21 | Disposition: A | Discharge status: Home / Self Care | Payer: BLUE CROSS/BLUE SHIELD | Source: Ambulatory Visit | Attending: Obstetrics & Gynecology | Admitting: Obstetrics & Gynecology

## 2017-07-21 ENCOUNTER — Ambulatory Visit (INDEPENDENT_AMBULATORY_CARE_PROVIDER_SITE_OTHER): Payer: BLUE CROSS/BLUE SHIELD | Admitting: Obstetrics & Gynecology

## 2017-07-21 VITALS — BP 116/80 | HR 72 | Ht 63.75 in | Wt 140.5 lb

## 2017-07-21 DIAGNOSIS — Z01419 Encounter for gynecological examination (general) (routine) without abnormal findings: Secondary | ICD-10-CM | POA: Diagnosis not present

## 2017-07-21 DIAGNOSIS — Z01411 Encounter for gynecological examination (general) (routine) with abnormal findings: Secondary | ICD-10-CM | POA: Diagnosis not present

## 2017-07-21 DIAGNOSIS — R809 Proteinuria, unspecified: Secondary | ICD-10-CM | POA: Diagnosis not present

## 2017-07-21 DIAGNOSIS — A749 Chlamydial infection, unspecified: Secondary | ICD-10-CM

## 2017-07-21 DIAGNOSIS — R319 Hematuria, unspecified: Secondary | ICD-10-CM

## 2017-07-21 HISTORY — DX: Chlamydial infection, unspecified: A74.9

## 2017-07-21 LAB — POCT URINALYSIS DIPSTICK
Glucose, UA: NEGATIVE
KETONES UA: NEGATIVE
LEUKOCYTES UA: NEGATIVE
Nitrite, UA: NEGATIVE

## 2017-07-21 MED ORDER — NORGESTIMATE-ETH ESTRADIOL 0.25-35 MG-MCG PO TABS: 1.0000 | ORAL_TABLET | Freq: Every day | ORAL | 11 refills | Status: DC

## 2017-07-21 MED ORDER — DOXYCYCLINE HYCLATE 100 MG PO TABS: 100.0000 mg | ORAL_TABLET | Freq: Two times a day (BID) | ORAL | 0 refills | Status: DC

## 2017-07-21 NOTE — Addendum Note (Signed)
Addended by: Colen DarlingYOUNG, Chaneka Trefz S on: 07/21/2017 04:28 PM   Modules accepted: Orders

## 2017-07-21 NOTE — Progress Notes (Signed)
Subjective:     Heidi Shields is a 21 y.o. female here for a routine exam.  Patient's last menstrual period was 07/07/2017 (approximate). G0P0000 Birth Control Method:  OCP Menstrual Calendar(currently): regular  Current complaints: irritation.   Current acute medical issues:  none   Recent Gynecologic History Patient's last menstrual period was 07/07/2017 (approximate). Last Pap: first one,   Last mammogram: na,    Past Medical History:  Diagnosis Date  . Acne vulgaris   . Anxiety and depression   . Hay fever   . History of mononucleosis syndrome 2012  . Irregular menstrual cycle     History reviewed. No pertinent surgical history.  OB History    Gravida Para Term Preterm AB Living   0 0 0 0 0 0   SAB TAB Ectopic Multiple Live Births   0 0 0 0 0      Social History   Social History  . Marital status: Single    Spouse name: N/A  . Number of children: N/A  . Years of education: N/A   Social History Main Topics  . Smoking status: Never Smoker  . Smokeless tobacco: Never Used  . Alcohol use Yes     Comment: occ  . Drug use: No  . Sexual activity: Yes    Birth control/ protection: Pill   Other Topics Concern  . 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   HS grad: [employer].   Lives with mom, dad, 2 sisters, and 1 brother in [place], [place].   Works full time as of 05/2017.   No T/A/Ds.    Family History  Problem Relation Age of Onset  . Breast cancer Other        Aunt  . Diabetes Father   . High blood pressure Paternal Grandfather   . High blood pressure Paternal Grandmother   . Thyroid disease Sister      Current Outpatient Prescriptions:  .  ibuprofen (ADVIL,MOTRIN) 200 MG tablet, Take 400 mg by mouth once as needed. For pain, Disp: , Rfl:  .  norgestimate-ethinyl estradiol (ORTHO-CYCLEN,SPRINTEC,PREVIFEM) 0.25-35 MG-MCG tablet, Take 1 tablet by mouth daily., Disp: 1  Package, Rfl: 11 .  doxycycline (VIBRA-TABS) 100 MG tablet, Take 1 tablet (100 mg total) by mouth 2 (two) times daily., Disp: 20 tablet, Rfl: 0  Review of Systems  Review of Systems  Constitutional: Negative for fever, chills, weight loss, malaise/fatigue and diaphoresis.  HENT: Negative for hearing loss, ear pain, nosebleeds, congestion, sore throat, neck pain, tinnitus and ear discharge.   Eyes: Negative for blurred vision, double vision, photophobia, pain, discharge and redness.  Respiratory: Negative for cough, hemoptysis, sputum production, shortness of breath, wheezing and stridor.   Cardiovascular: Negative for chest pain, palpitations, orthopnea, claudication, leg swelling and PND.  Gastrointestinal: negative for abdominal pain. Negative for heartburn, nausea, vomiting, diarrhea, constipation, blood in stool and melena.  Genitourinary: Negative for dysuria, urgency, frequency, hematuria and flank pain.  Musculoskeletal: Negative for myalgias, back pain, joint pain and falls.  Skin: Negative for itching and rash.  Neurological: Negative for dizziness, tingling, tremors, sensory change, speech change, focal weakness, seizures, loss of consciousness, weakness and headaches.  Endo/Heme/Allergies: Negative for environmental allergies and polydipsia. Does not bruise/bleed easily.  Psychiatric/Behavioral: Negative for depression, suicidal ideas, hallucinations, memory loss and substance abuse. The patient is not nervous/anxious and does not have insomnia.  Objective:  Blood pressure 116/80, pulse 72, height 5' 3.75" (1.619 m), weight 140 lb 8 oz (63.7 kg), last menstrual period 07/07/2017.   Physical Exam  Vitals reviewed. Constitutional: She is oriented to person, place, and time. She appears well-developed and well-nourished.  HENT:  Head: Normocephalic and atraumatic.        Right Ear: External ear normal.  Left Ear: External ear normal.  Nose: Nose normal.  Mouth/Throat:  Oropharynx is clear and moist.  Eyes: Conjunctivae and EOM are normal. Pupils are equal, round, and reactive to light. Right eye exhibits no discharge. Left eye exhibits no discharge. No scleral icterus.  Neck: Normal range of motion. Neck supple. No tracheal deviation present. No thyromegaly present.  Cardiovascular: Normal rate, regular rhythm, normal heart sounds and intact distal pulses.  Exam reveals no gallop and no friction rub.   No murmur heard. Respiratory: Effort normal and breath sounds normal. No respiratory distress. She has no wheezes. She has no rales. She exhibits no tenderness.  GI: Soft. Bowel sounds are normal. She exhibits no distension and no mass. There is no tenderness. There is no rebound and no guarding.  Genitourinary:  Breasts no masses skin changes or nipple changes bilaterally      Vulva is normal without lesions Vagina is pink moist without discharge Cervix normal in appearance and pap is done Uterus is normal size shape and contour Adnexa is negative with normal sized ovaries   Wet Prep:   A sample of vaginal discharge was obtained from the posterior fornix using a cotton swab. 2 drops of saline were placed on a slide and the cotton swab was immersed in the saline. Microscopic evaluation was performed and results were as follows:  Negative  for yeast  Negative for clue cells , consistent with Bacterial vaginosis Negative for trichomonas  Abnormal- elevated WBC population   Whiff test: Negative   Musculoskeletal: Normal range of motion. She exhibits no edema and no tenderness.  Neurological: She is alert and oriented to person, place, and time. She has normal reflexes. She displays normal reflexes. No cranial nerve deficit. She exhibits normal muscle tone. Coordination normal.  Skin: Skin is warm and dry. No rash noted. No erythema. No pallor.  Psychiatric: She has a normal mood and affect. Her behavior is normal. Judgment and thought content normal.        Medications Ordered at today's visit: Meds ordered this encounter  Medications  . doxycycline (VIBRA-TABS) 100 MG tablet    Sig: Take 1 tablet (100 mg total) by mouth 2 (two) times daily.    Dispense:  20 tablet    Refill:  0  . norgestimate-ethinyl estradiol (ORTHO-CYCLEN,SPRINTEC,PREVIFEM) 0.25-35 MG-MCG tablet    Sig: Take 1 tablet by mouth daily.    Dispense:  1 Package    Refill:  11    Other orders placed at today's visit: No orders of the defined types were placed in this encounter.     Assessment:    Healthy female exam.    Plan:    Contraception: OCP (estrogen/progesterone). Follow up in: 1 year.     Return in about 1 year (around 07/21/2018).

## 2017-07-23 ENCOUNTER — Encounter: Payer: Self-pay | Admitting: Obstetrics & Gynecology

## 2017-07-23 LAB — CYTOLOGY - PAP
CHLAMYDIA, DNA PROBE: POSITIVE — AB
DIAGNOSIS: NEGATIVE
NEISSERIA GONORRHEA: NEGATIVE

## 2017-08-16 ENCOUNTER — Encounter: Payer: Self-pay | Admitting: Obstetrics & Gynecology

## 2017-08-18 ENCOUNTER — Other Ambulatory Visit: Payer: Self-pay | Admitting: Obstetrics & Gynecology

## 2017-08-18 MED ORDER — DOXYCYCLINE HYCLATE 100 MG PO TABS: 100.0000 mg | ORAL_TABLET | Freq: Two times a day (BID) | ORAL | 0 refills | Status: DC

## 2017-08-21 ENCOUNTER — Ambulatory Visit: Payer: BLUE CROSS/BLUE SHIELD | Admitting: Obstetrics & Gynecology

## 2017-09-02 ENCOUNTER — Encounter: Payer: Self-pay | Admitting: Family Medicine

## 2017-09-08 ENCOUNTER — Ambulatory Visit (INDEPENDENT_AMBULATORY_CARE_PROVIDER_SITE_OTHER): Payer: Self-pay | Admitting: Obstetrics & Gynecology

## 2017-09-08 ENCOUNTER — Encounter: Payer: Self-pay | Admitting: Obstetrics & Gynecology

## 2017-09-08 VITALS — BP 100/70 | HR 80 | Wt 140.0 lb

## 2017-09-08 DIAGNOSIS — A749 Chlamydial infection, unspecified: Secondary | ICD-10-CM

## 2017-09-08 NOTE — Progress Notes (Signed)
Chief Complaint  Patient presents with  . Follow-up    proof of cure. had +chlamydia    Blood pressure 100/70, pulse 80, weight 140 lb (63.5 kg), last menstrual period 08/17/2017.  20 y.o. G0P0000 Patient's last menstrual period was 08/17/2017. The current method of family planning is OCP (estrogen/progesterone).  Outpatient Encounter Prescriptions as of 09/08/2017  Medication Sig  . norgestimate-ethinyl estradiol (ORTHO-CYCLEN,SPRINTEC,PREVIFEM) 0.25-35 MG-MCG tablet Take 1 tablet by mouth daily.  Marland Kitchen doxycycline (VIBRA-TABS) 100 MG tablet Take 1 tablet (100 mg total) by mouth 2 (two) times daily. (Patient not taking: Reported on 09/08/2017)  . ibuprofen (ADVIL,MOTRIN) 200 MG tablet Take 400 mg by mouth once as needed. For pain   No facility-administered encounter medications on file as of 09/08/2017.     Subjective Heidi Shields 's in today for test of cure for a positive Chlamydia culture She was treated in the and had unprotected intercourse with the suspected person they gave it to her originally so we treated her for second course of she's back in today for her really initial test of cure She is without complaints today No discharge No irritation or itching No odor Completely resolve symptoms from previously  Objective General WDWN female NAD Vulva:  normal appearing vulva with no masses, tenderness or lesions Vagina:  normal mucosa, no discharge Cervix:  no cervical motion tenderness and no lesions Uterus:   Adnexa: ovaries:,     Pertinent ROS No burning with urination, frequency or urgency No nausea, vomiting or diarrhea Nor fever chills or other constitutional symptoms   Labs or studies Pending chlamydia culture   Impression Diagnoses this Encounter::   ICD-10-CM   1. Chlamydia A74.9    s/p treatment, test of cure todya    Established relevant diagnosis(es):   Plan/Recommendations: No orders of the defined types were placed in this  encounter.   Labs or Scans Ordered: No orders of the defined types were placed in this encounter.   Management:: I'll send Heidi Shields a my chart message when her culture report returns she can expect couple days She has any problems in the meantime she'll let me know otherwise we'll see her back as needed or for yearly exam  Follow up Return in about 11 months (around 07/26/2018) for yearly, with Dr Despina Hidden.      All questions were answered.  Past Medical History:  Diagnosis Date  . Acne vulgaris   . Anxiety and depression   . Chlamydia 07/21/2017   + at GYN screening age 72  . Hay fever   . History of mononucleosis syndrome 2012  . Irregular menstrual cycle     History reviewed. No pertinent surgical history.  OB History    Gravida Para Term Preterm AB Living   0 0 0 0 0 0   SAB TAB Ectopic Multiple Live Births   0 0 0 0 0      No Known Allergies  Social History   Social History  . Marital status: Single    Spouse name: N/A  . Number of children: N/A  . Years of education: N/A   Social History Main Topics  . Smoking status: Never Smoker  . Smokeless tobacco: Never Used  . Alcohol use Yes     Comment: occ  . Drug use: No  . Sexual activity: Yes    Birth control/ protection: Pill   Other Topics Concern  . 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   HS grad: [employer].   Lives with mom, dad, 2 sisters, and 1 brother in [place], [place].   Works full time as of 05/2017.   No T/A/Ds.    Family History  Problem Relation Age of Onset  . Breast cancer Other        Aunt  . Diabetes Father   . High blood pressure Paternal Grandfather   . High blood pressure Paternal Grandmother   . Thyroid disease Sister

## 2017-09-08 NOTE — Addendum Note (Signed)
Addended by: Federico Flake A on: 09/08/2017 11:04 AM   Modules accepted: Orders

## 2017-09-10 LAB — GC/CHLAMYDIA PROBE AMP
Chlamydia trachomatis, NAA: NEGATIVE
NEISSERIA GONORRHOEAE BY PCR: NEGATIVE

## 2017-09-29 ENCOUNTER — Telehealth: Payer: BLUE CROSS/BLUE SHIELD | Admitting: Family

## 2017-09-29 DIAGNOSIS — J019 Acute sinusitis, unspecified: Secondary | ICD-10-CM

## 2017-09-29 DIAGNOSIS — B9689 Other specified bacterial agents as the cause of diseases classified elsewhere: Secondary | ICD-10-CM

## 2017-09-29 DIAGNOSIS — J028 Acute pharyngitis due to other specified organisms: Secondary | ICD-10-CM

## 2017-09-29 MED ORDER — AMOXICILLIN 500 MG PO CAPS: 500.0000 mg | ORAL_CAPSULE | Freq: Three times a day (TID) | ORAL | 0 refills | Status: DC

## 2017-09-29 MED ORDER — AMOXICILLIN-POT CLAVULANATE 875-125 MG PO TABS: 1.0000 | ORAL_TABLET | Freq: Two times a day (BID) | ORAL | 0 refills | Status: DC

## 2017-09-29 NOTE — Progress Notes (Signed)
Spoke with patient - sent in rx for amoxicillin  1 po TID #30 no refills- note was written on rx to fill amoxicillin instead of augmentin if cheaper. In other words give patient which ever is cheaper because patient does not have insurance.

## 2017-09-29 NOTE — Progress Notes (Signed)

## 2017-09-29 NOTE — Addendum Note (Signed)
Addended by: Bennie Pierini on: 09/29/2017 06:11 PM   Modules accepted: Orders

## 2017-10-11 ENCOUNTER — Encounter: Payer: Self-pay | Admitting: Family Medicine

## 2017-10-14 ENCOUNTER — Encounter: Payer: Self-pay | Admitting: Family Medicine

## 2017-10-14 ENCOUNTER — Encounter: Payer: Self-pay | Admitting: Obstetrics & Gynecology

## 2017-10-19 ENCOUNTER — Encounter: Payer: Self-pay | Admitting: Family Medicine

## 2017-10-19 NOTE — Telephone Encounter (Signed)
Suspect this is coming from the stress associated with a new job.  She should try tylenol in the mornings when she feels a headache coming on (1000 mg). Also, make sure to eat some breakfast. Come see me if still problems with this in a week or so.

## 2017-10-19 NOTE — Telephone Encounter (Signed)
Please advise. Thanks.  

## 2017-10-21 ENCOUNTER — Encounter: Payer: Self-pay | Admitting: Obstetrics & Gynecology

## 2017-10-22 ENCOUNTER — Ambulatory Visit: Payer: Self-pay | Admitting: Obstetrics & Gynecology

## 2017-12-18 ENCOUNTER — Other Ambulatory Visit: Payer: Self-pay | Admitting: Nurse Practitioner

## 2018-01-26 ENCOUNTER — Encounter: Payer: Self-pay | Admitting: Family Medicine

## 2018-03-15 ENCOUNTER — Encounter: Payer: Self-pay | Admitting: Obstetrics & Gynecology

## 2018-03-15 ENCOUNTER — Telehealth: Payer: Self-pay | Admitting: Family Medicine

## 2018-03-15 NOTE — Telephone Encounter (Signed)
Patient is having birth control Rx filled. Patient looked at pack & noticed that Dr. Forestine ChuteEure's name is on last Rx. She will contact her GYN office about Rx. Advised patient to call us back if she needed anything else.

## 2018-03-23 ENCOUNTER — Observation Stay (HOSPITAL_COMMUNITY)
Admission: EM | Admit: 2018-03-23 | Discharge: 2018-03-25 | Disposition: A | Discharge status: Home / Self Care | Payer: 59 | Attending: Internal Medicine | Admitting: Internal Medicine

## 2018-03-23 ENCOUNTER — Emergency Department (HOSPITAL_COMMUNITY): Payer: 59

## 2018-03-23 ENCOUNTER — Telehealth: Payer: BLUE CROSS/BLUE SHIELD | Admitting: Family

## 2018-03-23 ENCOUNTER — Other Ambulatory Visit: Payer: Self-pay

## 2018-03-23 ENCOUNTER — Encounter (HOSPITAL_COMMUNITY): Payer: Self-pay | Admitting: *Deleted

## 2018-03-23 DIAGNOSIS — J209 Acute bronchitis, unspecified: Secondary | ICD-10-CM | POA: Diagnosis not present

## 2018-03-23 DIAGNOSIS — R05 Cough: Secondary | ICD-10-CM

## 2018-03-23 DIAGNOSIS — D72829 Elevated white blood cell count, unspecified: Secondary | ICD-10-CM | POA: Insufficient documentation

## 2018-03-23 DIAGNOSIS — R739 Hyperglycemia, unspecified: Secondary | ICD-10-CM | POA: Insufficient documentation

## 2018-03-23 DIAGNOSIS — R064 Hyperventilation: Secondary | ICD-10-CM | POA: Diagnosis not present

## 2018-03-23 DIAGNOSIS — F410 Panic disorder [episodic paroxysmal anxiety] without agoraphobia: Secondary | ICD-10-CM | POA: Diagnosis not present

## 2018-03-23 DIAGNOSIS — E874 Mixed disorder of acid-base balance: Principal | ICD-10-CM | POA: Insufficient documentation

## 2018-03-23 DIAGNOSIS — R059 Cough, unspecified: Secondary | ICD-10-CM

## 2018-03-23 DIAGNOSIS — E872 Acidosis, unspecified: Secondary | ICD-10-CM

## 2018-03-23 DIAGNOSIS — R000 Tachycardia, unspecified: Secondary | ICD-10-CM | POA: Diagnosis not present

## 2018-03-23 DIAGNOSIS — E873 Alkalosis: Secondary | ICD-10-CM | POA: Diagnosis present

## 2018-03-23 DIAGNOSIS — E1110 Type 2 diabetes mellitus with ketoacidosis without coma: Secondary | ICD-10-CM | POA: Diagnosis not present

## 2018-03-23 DIAGNOSIS — R0602 Shortness of breath: Secondary | ICD-10-CM | POA: Diagnosis not present

## 2018-03-23 DIAGNOSIS — R0600 Dyspnea, unspecified: Secondary | ICD-10-CM | POA: Diagnosis not present

## 2018-03-23 DIAGNOSIS — R079 Chest pain, unspecified: Secondary | ICD-10-CM | POA: Diagnosis not present

## 2018-03-23 DIAGNOSIS — F329 Major depressive disorder, single episode, unspecified: Secondary | ICD-10-CM | POA: Diagnosis not present

## 2018-03-23 DIAGNOSIS — R911 Solitary pulmonary nodule: Secondary | ICD-10-CM | POA: Diagnosis not present

## 2018-03-23 DIAGNOSIS — J40 Bronchitis, not specified as acute or chronic: Secondary | ICD-10-CM

## 2018-03-23 DIAGNOSIS — R0789 Other chest pain: Secondary | ICD-10-CM | POA: Diagnosis not present

## 2018-03-23 DIAGNOSIS — E0810 Diabetes mellitus due to underlying condition with ketoacidosis without coma: Secondary | ICD-10-CM

## 2018-03-23 LAB — CBC
HCT: 38.9 % (ref 36.0–46.0)
Hemoglobin: 14.1 g/dL (ref 12.0–15.0)
MCH: 30 pg (ref 26.0–34.0)
MCHC: 36.2 g/dL — ABNORMAL HIGH (ref 30.0–36.0)
MCV: 82.8 fL (ref 78.0–100.0)
PLATELETS: 274 10*3/uL (ref 150–400)
RBC: 4.7 MIL/uL (ref 3.87–5.11)
RDW: 12.2 % (ref 11.5–15.5)
WBC: 15.1 10*3/uL — AB (ref 4.0–10.5)

## 2018-03-23 LAB — RAPID URINE DRUG SCREEN, HOSP PERFORMED
AMPHETAMINES: NOT DETECTED
BARBITURATES: NOT DETECTED
Benzodiazepines: NOT DETECTED
Cocaine: NOT DETECTED
Opiates: NOT DETECTED
Tetrahydrocannabinol: NOT DETECTED

## 2018-03-23 LAB — D-DIMER, QUANTITATIVE: D-Dimer, Quant: <0.27 ug/mL-FEU (ref 0.00–0.50)

## 2018-03-23 LAB — BASIC METABOLIC PANEL
Anion gap: 17 — ABNORMAL HIGH (ref 5–15)
BUN: 10 mg/dL (ref 6–20)
CALCIUM: 10.7 mg/dL — AB (ref 8.9–10.3)
CO2: 16 mmol/L — ABNORMAL LOW (ref 22–32)
CREATININE: 0.78 mg/dL (ref 0.44–1.00)
Chloride: 107 mmol/L (ref 101–111)
GFR calc non Af Amer: >60 mL/min (ref >60)
Glucose, Bld: 237 mg/dL — ABNORMAL HIGH (ref 65–99)
Potassium: 3.7 mmol/L (ref 3.5–5.1)
SODIUM: 140 mmol/L (ref 135–145)

## 2018-03-23 LAB — BLOOD GAS, VENOUS
ACID-BASE DEFICIT: 5 mmol/L — AB (ref 0.0–2.0)
BICARBONATE: 21.2 mmol/L (ref 20.0–28.0)
PH VEN: 7.585 — AB (ref 7.250–7.430)
Patient temperature: 37
pCO2, Ven: 17.5 mmHg — CL (ref 44.0–60.0)

## 2018-03-23 LAB — I-STAT TROPONIN, ED: TROPONIN I, POC: 0 ng/mL (ref 0.00–0.08)

## 2018-03-23 LAB — I-STAT BETA HCG BLOOD, ED (MC, WL, AP ONLY)

## 2018-03-23 MED ORDER — PREDNISONE 10 MG (21) PO TBPK: ORAL_TABLET | ORAL | 0 refills | Status: DC

## 2018-03-23 MED ORDER — SODIUM CHLORIDE 0.9 % IV BOLUS
1000.0000 mL | Freq: Once | INTRAVENOUS | Status: AC
  Administered 2018-03-23: 1000 mL via INTRAVENOUS

## 2018-03-23 NOTE — ED Provider Notes (Signed)
[facility] EMERGENCY DEPARTMENT Provider Note   CSN: [id number] Arrival date & time: 03/23/18  2145     History   Chief Complaint Chief Complaint  Patient presents with  . Chest Pain    HPI Heidi Shields is a 22 y.o. female.  HPI [place]anes of shortness of breath upon awakening 9 AM today.  Patient also reports that she has had a cough nonproductive for the past 4 days.  No fever.  No treatment prior to coming here.  Other associated symptoms include anterior chest pressure which is been constant since awakening this morning.  Nothing makes symptoms better or worse.  No other associated symptoms no treatment prior to coming here.  She had a "E visit today" was diagnosed with bronchitis Past Medical History:  Diagnosis Date  . Acne vulgaris   . Anxiety and depression   . Chlamydia 07/21/2017   + at GYN screening age 77.  Test of cure done and was negative (Dr. Despina Hidden).  . Hay fever   . History of mononucleosis syndrome 2012  . Irregular menstrual cycle     Patient Active Problem List   Diagnosis Date Noted  . Health maintenance examination 05/29/2016  . Headache disorder 03/10/2013  . Acne vulgaris 03/10/2013  . Irregular menstrual cycle 03/10/2013  . Encounter for other general counseling or advice on contraception 03/10/2013    History reviewed. No pertinent surgical history.   OB History    Gravida  0   Para  0   Term  0   Preterm  0   AB  0   Living  0     SAB  0   TAB  0   Ectopic  0   Multiple  0   Live Births  0            Home Medications    Prior to Admission medications   Medication Sig Start Date End Date Taking? Authorizing Provider  amoxicillin (AMOXIL) 500 MG capsule Take 1 capsule (500 mg total) by mouth 3 (three) times daily. 09/29/17   Daphine Deutscher, Mary-Margaret, FNP  doxycycline (VIBRA-TABS) 100 MG tablet Take 1 tablet (100 mg total) by mouth 2 (two) times daily. Patient not taking: Reported on 09/08/2017 08/18/17   Lazaro Arms,  MD  ibuprofen (ADVIL,MOTRIN) 200 MG tablet Take 400 mg by mouth once as needed. For pain    [provider]  norgestimate-ethinyl estradiol (ORTHO-CYCLEN,SPRINTEC,PREVIFEM) 0.25-35 MG-MCG tablet Take 1 tablet by mouth daily. 07/21/17   Lazaro Arms, MD  predniSONE (STERAPRED UNI-PAK 21 TAB) 10 MG (21) TBPK tablet As directed 03/23/18   Eulis Foster, FNP    Family History Family History  Problem Relation Age of Onset  . Breast cancer Other        Aunt  . Diabetes Father   . High blood pressure Paternal Grandfather   . High blood pressure Paternal Grandmother   . Thyroid disease Sister     Social History Social History   Tobacco Use  . Smoking status: Never Smoker  . Smokeless tobacco: Never Used  Substance Use Topics  . Alcohol use: Yes    Comment: occ  . Drug use: No     Allergies   Patient has no known allergies.   Review of Systems Review of Systems  Constitutional: Negative.   HENT: Negative.   Respiratory: Positive for cough, chest tightness and shortness of breath.   Cardiovascular: Negative.   Gastrointestinal: Negative.   Musculoskeletal:  Negative.   Skin: Negative.   Neurological: Negative.   Psychiatric/Behavioral: The patient is nervous/anxious.   All other systems reviewed and are negative.    Physical Exam Updated Vital Signs BP 119/83   Pulse (!) 127   Temp 98.4 F (36.9 C) (Oral)   Resp 17   LMP 03/14/2018   SpO2 98%   Physical Exam  Constitutional: She is oriented to person, place, and time. She appears well-developed and well-nourished. She appears distressed.  Appears anxious  HENT:  Head: Normocephalic and atraumatic.  Eyes: Pupils are equal, round, and reactive to light. Conjunctivae are normal.  Neck: Neck supple. No tracheal deviation present. No thyromegaly present.  Cardiovascular: Normal rate, regular rhythm and normal heart sounds.  No murmur heard. Tachycardic  Pulmonary/Chest: Effort normal and breath sounds  normal.  Abdominal: Soft. Bowel sounds are normal. She exhibits no distension. There is no tenderness.  Musculoskeletal: Normal range of motion. She exhibits no edema or tenderness.  Neurological: She is alert and oriented to person, place, and time. Coordination normal.  Skin: Skin is warm and dry. No rash noted.  Psychiatric: She has a normal mood and affect.  Anxious  Nursing note and vitals reviewed.    ED Treatments / Results  Labs (all labs ordered are listed, but only abnormal results are displayed) Labs Reviewed  CBC - Abnormal; Notable for the following components:      Result Value   WBC 15.1 (*)    MCHC 36.2 (*)    All other components within normal limits  BASIC METABOLIC PANEL  RAPID URINE DRUG SCREEN, HOSP PERFORMED  D-DIMER, QUANTITATIVE (NOT AT [facility])  I-STAT TROPONIN, ED  I-STAT BETA HCG BLOOD, ED ([facility], [facility], [facility] ONLY)    EKG Interpretation  Date/Time:  Tuesday March 23 2018 22:11:03 EDT Ventricular Rate:  125 PR Interval:    QRS Duration: 83 QT Interval:  299 QTC Calculation: 432 R Axis:   91 Text Interpretation:  Sinus tachycardia Borderline right axis deviation Confirmed by Doug Sou [phone]) on 03/23/2018 11:07:31 PM     Rate increased over July 12, 2010..  Tracing from earlier today of poor quality EKG None  Radiology Dg Chest 2 View  Result Date: 03/23/2018 CLINICAL DATA:  Chest pain EXAM: CHEST - 2 VIEW COMPARISON:  09/08/2005 FINDINGS: The heart size and mediastinal contours are within normal limits. Both lungs are clear. The visualized skeletal structures are unremarkable. Mild bronchitic changes. IMPRESSION: Mild bronchitic changes.  No focal pulmonary infiltrate. Electronically Signed   By: Jasmine Pang M.D.   On: 03/23/2018 22:49  chest x-ray viewed by me  Procedures Procedures (including critical care time)  Medications Ordered in ED Medications  sodium chloride 0.9 % bolus 1,000 mL (has no administration in time range)     Initial  Impression / Assessment and Plan / ED Course  I have reviewed the triage vital signs and the nursing notes.  Pertinent labs & imaging results that were available during my care of the patient were reviewed by me and considered in my medical decision making (see chart for details).   12:15 AM she feels improved after treatment with intravenous hydration.  She does not feel nervous or panicked.  Subcutaneous insulin ordered.  She is less tachycardic after treatment with intravenous hydration with normal saline  Lab work consistent with anion gap acidosis and diabetic ketoacidosis.  Venous blood gas consistent with hyperventilation and respiratory alkalosis.  I consulted Dr. Sharl Ma from hospitalist service will arrange  for overnight stay Low pretest probability for pulmonary embolism.  Negative d-dimer Final Clinical Impressions(s) / ED Diagnoses  Diagnoses #1 diabetic ketoacidosis #2 hyperventilation Final diagnoses:  None  CRITICAL CARE Performed by: Doug SouSam Tamika Nou Total critical care time: 40 minutes Critical care time was exclusive of separately billable procedures and treating other patients. Critical care was necessary to treat or prevent imminent or life-threatening deterioration. Critical care was time spent personally by me on the following activities: development of treatment plan with patient and/or surrogate as well as nursing, discussions with consultants, evaluation of patient's response to treatment, examination of patient, obtaining history from patient or surrogate, ordering and performing treatments and interventions, ordering and review of laboratory studies, ordering and review of radiographic studies, pulse oximetry and re-evaluation of patient's condition.  ED Discharge Orders    None       Doug SouJacubowitz, Verlan Grotz, MD 03/24/18 [phone]

## 2018-03-23 NOTE — ED Notes (Signed)
Have notified MD of pt.'s high HR.

## 2018-03-23 NOTE — Progress Notes (Signed)

## 2018-03-23 NOTE — ED Notes (Signed)
Pt calmer, pt and family updated on plan of care,

## 2018-03-23 NOTE — ED Triage Notes (Signed)
Pt c/o "chest pain" feels like something is sitting on my chest that started this am,  admits to cough but states that the cough has been better since Sunday,

## 2018-03-24 ENCOUNTER — Observation Stay (HOSPITAL_BASED_OUTPATIENT_CLINIC_OR_DEPARTMENT_OTHER): Payer: 59

## 2018-03-24 ENCOUNTER — Observation Stay (HOSPITAL_COMMUNITY): Payer: 59

## 2018-03-24 DIAGNOSIS — F410 Panic disorder [episodic paroxysmal anxiety] without agoraphobia: Secondary | ICD-10-CM

## 2018-03-24 DIAGNOSIS — R000 Tachycardia, unspecified: Secondary | ICD-10-CM

## 2018-03-24 DIAGNOSIS — R0600 Dyspnea, unspecified: Secondary | ICD-10-CM | POA: Diagnosis not present

## 2018-03-24 DIAGNOSIS — E872 Acidosis, unspecified: Secondary | ICD-10-CM

## 2018-03-24 DIAGNOSIS — E873 Alkalosis: Secondary | ICD-10-CM | POA: Diagnosis present

## 2018-03-24 DIAGNOSIS — J208 Acute bronchitis due to other specified organisms: Secondary | ICD-10-CM | POA: Diagnosis not present

## 2018-03-24 DIAGNOSIS — D72829 Elevated white blood cell count, unspecified: Secondary | ICD-10-CM | POA: Diagnosis not present

## 2018-03-24 DIAGNOSIS — R739 Hyperglycemia, unspecified: Secondary | ICD-10-CM

## 2018-03-24 DIAGNOSIS — E874 Mixed disorder of acid-base balance: Secondary | ICD-10-CM | POA: Diagnosis not present

## 2018-03-24 DIAGNOSIS — J209 Acute bronchitis, unspecified: Secondary | ICD-10-CM

## 2018-03-24 DIAGNOSIS — F329 Major depressive disorder, single episode, unspecified: Secondary | ICD-10-CM | POA: Diagnosis not present

## 2018-03-24 DIAGNOSIS — R0602 Shortness of breath: Secondary | ICD-10-CM | POA: Diagnosis not present

## 2018-03-24 DIAGNOSIS — R911 Solitary pulmonary nodule: Secondary | ICD-10-CM | POA: Diagnosis not present

## 2018-03-24 LAB — GLUCOSE, CAPILLARY
GLUCOSE-CAPILLARY: 79 mg/dL (ref 65–99)
Glucose-Capillary: 109 mg/dL — ABNORMAL HIGH (ref 65–99)
Glucose-Capillary: 96 mg/dL (ref 65–99)
Glucose-Capillary: 83 mg/dL (ref 65–99)
Glucose-Capillary: 135 mg/dL — ABNORMAL HIGH (ref 65–99)

## 2018-03-24 LAB — LACTIC ACID, PLASMA: Lactic Acid, Venous: 1.1 mmol/L (ref 0.5–1.9)

## 2018-03-24 LAB — ECHOCARDIOGRAM COMPLETE
Height: 64 in
WEIGHTICAEL: 2405.66 [oz_av]

## 2018-03-24 LAB — COMPREHENSIVE METABOLIC PANEL
ALBUMIN: 3.4 g/dL — AB (ref 3.5–5.0)
ALT: 11 U/L — ABNORMAL LOW (ref 14–54)
ANION GAP: 8 (ref 5–15)
AST: 13 U/L — ABNORMAL LOW (ref 15–41)
Alkaline Phosphatase: 39 U/L (ref 38–126)
BUN: 9 mg/dL (ref 6–20)
CO2: 20 mmol/L — AB (ref 22–32)
Calcium: 8.5 mg/dL — ABNORMAL LOW (ref 8.9–10.3)
Chloride: 110 mmol/L (ref 101–111)
Creatinine, Ser: 0.5 mg/dL (ref 0.44–1.00)
GFR calc Af Amer: >60 mL/min (ref >60)
GFR calc non Af Amer: >60 mL/min (ref >60)
GLUCOSE: 117 mg/dL — AB (ref 65–99)
POTASSIUM: 3.9 mmol/L (ref 3.5–5.1)
SODIUM: 138 mmol/L (ref 135–145)
TOTAL PROTEIN: 6.8 g/dL (ref 6.5–8.1)
Total Bilirubin: 0.7 mg/dL (ref 0.3–1.2)

## 2018-03-24 LAB — CBC
HCT: 32.2 % — ABNORMAL LOW (ref 36.0–46.0)
Hemoglobin: 11.3 g/dL — ABNORMAL LOW (ref 12.0–15.0)
MCH: 29.4 pg (ref 26.0–34.0)
MCHC: 35.1 g/dL (ref 30.0–36.0)
MCV: 83.9 fL (ref 78.0–100.0)
PLATELETS: 223 10*3/uL (ref 150–400)
RBC: 3.84 MIL/uL — ABNORMAL LOW (ref 3.87–5.11)
RDW: 11.9 % (ref 11.5–15.5)
WBC: 13.7 10*3/uL — ABNORMAL HIGH (ref 4.0–10.5)

## 2018-03-24 LAB — T4, FREE: Free T4: 0.82 ng/dL (ref 0.61–1.12)

## 2018-03-24 LAB — BASIC METABOLIC PANEL
Anion gap: 13 (ref 5–15)
BUN: 9 mg/dL (ref 6–20)
CO2: 16 mmol/L — ABNORMAL LOW (ref 22–32)
Calcium: 9.6 mg/dL (ref 8.9–10.3)
Chloride: 110 mmol/L (ref 101–111)
Creatinine, Ser: 0.63 mg/dL (ref 0.44–1.00)
GFR calc Af Amer: >60 mL/min (ref >60)
Glucose, Bld: 155 mg/dL — ABNORMAL HIGH (ref 65–99)
POTASSIUM: 3.5 mmol/L (ref 3.5–5.1)
SODIUM: 139 mmol/L (ref 135–145)

## 2018-03-24 LAB — SALICYLATE LEVEL: Salicylate Lvl: <7 mg/dL (ref 2.8–30.0)

## 2018-03-24 LAB — HEMOGLOBIN A1C
HEMOGLOBIN A1C: 3.9 % — AB (ref 4.8–5.6)
MEAN PLASMA GLUCOSE: 65.23 mg/dL

## 2018-03-24 LAB — AMMONIA: Ammonia: 28 umol/L (ref 9–35)

## 2018-03-24 LAB — TSH: TSH: 2.767 u[IU]/mL (ref 0.350–4.500)

## 2018-03-24 LAB — MRSA PCR SCREENING: MRSA BY PCR: NEGATIVE

## 2018-03-24 MED ORDER — SODIUM CHLORIDE 0.9 % IV SOLN
INTRAVENOUS | Status: DC
  Administered 2018-03-24: 03:00:00 via INTRAVENOUS

## 2018-03-24 MED ORDER — POTASSIUM CHLORIDE IN NACL 20-0.9 MEQ/L-% IV SOLN
INTRAVENOUS | Status: DC
  Administered 2018-03-24 – 2018-03-25 (×2): via INTRAVENOUS

## 2018-03-24 MED ORDER — SODIUM CHLORIDE 0.9 % IV BOLUS: 1000.0000 mL | Freq: Once | INTRAVENOUS | Status: DC

## 2018-03-24 MED ORDER — LORAZEPAM 1 MG PO TABS: 1.0000 mg | ORAL_TABLET | ORAL | Status: DC | PRN

## 2018-03-24 MED ORDER — LORAZEPAM 1 MG PO TABS
1.0000 mg | ORAL_TABLET | Freq: Once | ORAL | Status: AC
  Administered 2018-03-24: 1 mg via ORAL
  Filled 2018-03-24: qty 1

## 2018-03-24 MED ORDER — IOPAMIDOL (ISOVUE-370) INJECTION 76%
100.0000 mL | Freq: Once | INTRAVENOUS | Status: AC | PRN
  Administered 2018-03-24: 80 mL via INTRAVENOUS

## 2018-03-24 MED ORDER — INSULIN ASPART 100 UNIT/ML ~~LOC~~ SOLN: 0.0000 [IU] | Freq: Three times a day (TID) | SUBCUTANEOUS | Status: DC

## 2018-03-24 MED ORDER — INSULIN ASPART 100 UNIT/ML ~~LOC~~ SOLN: 4.0000 [IU] | Freq: Once | SUBCUTANEOUS | Status: DC

## 2018-03-24 MED ORDER — ONDANSETRON HCL 4 MG/2ML IJ SOLN: 4.0000 mg | Freq: Four times a day (QID) | INTRAMUSCULAR | Status: DC | PRN

## 2018-03-24 MED ORDER — ACETAMINOPHEN 325 MG PO TABS: 650.0000 mg | ORAL_TABLET | Freq: Four times a day (QID) | ORAL | Status: DC | PRN

## 2018-03-24 MED ORDER — INSULIN ASPART 100 UNIT/ML ~~LOC~~ SOLN: 5.0000 [IU] | Freq: Once | SUBCUTANEOUS | Status: DC

## 2018-03-24 MED ORDER — ENOXAPARIN SODIUM 40 MG/0.4ML ~~LOC~~ SOLN
40.0000 mg | SUBCUTANEOUS | Status: DC
  Administered 2018-03-24: 40 mg via SUBCUTANEOUS
  Filled 2018-03-24 (×2): qty 0.4

## 2018-03-24 NOTE — H&P (Signed)
TRH H&P    Patient Demographics:    Heidi Shields, is a 22 y.o. female  MRN: [id number]  DOB - 1996/09/28  Admit Date - 03/23/2018  Referring MD/NP/PA: Dr. Ethelda Chick  Outpatient Primary MD for the patient is McGowen, Maryjean Morn, MD  Patient coming from: Home  Chief complaint-shortness of breath   HPI:    Heidi Shields  is a 22 y.o. female, with history of anxiety and depression came to hospital after patient developed shortness of breath upon awakening this morning around 9 AM.  Patient said that she was having nonproductive cough for past 4 days.  Today she went to outpatient clinic and was given prednisone tapering dose, patient took 60 mg of prednisone today in the afternoon. When she did not feel better she came to the ED for further evaluation. She denies fever or chills. Complains of chest pressure but no chest pain  In the ED patient was found to be hyperventilating, venous ABG shows respiratory alkalosis with pH 7.585 low PCO2 of 17.5.  Patient's BMP showed blood glucose of 237, bicarb 16. D-dimer was negative, less than 0.27  Patient does not have history of diabetes mellitus. No previous history of stroke or seizures No history of CAD    Review of systems:    In addition to the HPI above,    All other systems reviewed and are negative.   With Past History of the following :    Past Medical History:  Diagnosis Date  . Acne vulgaris   . Anxiety and depression   . Chlamydia 07/21/2017   + at GYN screening age 26.  Test of cure done and was negative (Dr. Despina Hidden).  . Hay fever   . History of mononucleosis syndrome 2012  . Irregular menstrual cycle       History reviewed. No pertinent surgical history.    Social History:      Social History   Tobacco Use  . Smoking status: Never Smoker  . Smokeless tobacco: Never Used  Substance Use Topics  . Alcohol use: Yes    Comment:  occ       Family History :     Family History  Problem Relation Age of Onset  . Breast cancer Other        Aunt  . Diabetes Father   . High blood pressure Paternal Grandfather   . High blood pressure Paternal Grandmother   . Thyroid disease Sister       Home Medications:   Prior to Admission medications   Medication Sig Start Date End Date Taking? Authorizing Provider  norgestimate-ethinyl estradiol (ORTHO-CYCLEN,SPRINTEC,PREVIFEM) 0.25-35 MG-MCG tablet Take 1 tablet by mouth daily. 07/21/17  Yes Lazaro Arms, MD  predniSONE (STERAPRED UNI-PAK 21 TAB) 10 MG (21) TBPK tablet Take by mouth as directed. (6,5,4,3,2,1) starting on 03/23/2018   Yes [provider]     Allergies:    No Known Allergies   Physical Exam:   Vitals  Blood pressure 109/87, pulse (!) 109, temperature 98.4 F (36.9 C), temperature source  Oral, resp. rate (!) 24, last menstrual period 03/14/2018, SpO2 100 %.  1.  General: Appears anxious, hyperventilating  2. Psychiatric:  Intact judgement and  insight, awake alert, oriented x 3.  3. Neurologic: No focal neurological deficits, all cranial nerves intact.Strength 5/5 all 4 extremities, sensation intact all 4 extremities, plantars down going.  4. Eyes :  anicteric sclerae, moist conjunctivae with no lid lag. PERRLA.  5. ENMT:  Oropharynx clear with moist mucous membranes and good dentition  6. Neck:  supple, no cervical lymphadenopathy appriciated, No thyromegaly  7. Respiratory : Normal respiratory effort, good air movement bilaterally,clear to  auscultation bilaterally  8. Cardiovascular : RRR, no gallops, rubs or murmurs, no leg edema  9. Gastrointestinal:  Positive bowel sounds, abdomen soft, non-tender to palpation,no hepatosplenomegaly, no rigidity or guarding       10. Skin:  No cyanosis, normal texture and turgor, no rash, lesions or ulcers  11.Musculoskeletal:  Good muscle tone,  joints appear normal , no effusions,   normal range of motion    Data Review:    CBC Recent Labs  Lab 03/23/18 2215  WBC 15.1*  HGB 14.1  HCT 38.9  PLT 274  MCV 82.8  MCH 30.0  MCHC 36.2*  RDW 12.2   ------------------------------------------------------------------------------------------------------------------  Chemistries  Recent Labs  Lab 03/23/18 2215  NA 140  K 3.7  CL 107  CO2 16*  GLUCOSE 237*  BUN 10  CREATININE 0.78  CALCIUM 10.7*   ------------------------------------------------------------------------------------------------------------------  ------------------------------------------------------------------------------------------------------------------ GFR: CrCl cannot be calculated (Unknown ideal weight.). Liver Function Tests: No results for input(s): AST, ALT, ALKPHOS, BILITOT, PROT, ALBUMIN in the last 168 hours. No results for input(s): LIPASE, AMYLASE in the last 168 hours. No results for input(s): AMMONIA in the last 168 hours. Coagulation Profile: No results for input(s): INR, PROTIME in the last 168 hours. Cardiac Enzymes: No results for input(s): CKTOTAL, CKMB, CKMBINDEX, TROPONINI in the last 168 hours. BNP (last 3 results) No results for input(s): PROBNP in the last 8760 hours. HbA1C: No results for input(s): HGBA1C in the last 72 hours. CBG: No results for input(s): GLUCAP in the last 168 hours. Lipid Profile: No results for input(s): CHOL, HDL, LDLCALC, TRIG, CHOLHDL, LDLDIRECT in the last 72 hours. Thyroid Function Tests: No results for input(s): TSH, T4TOTAL, FREET4, T3FREE, THYROIDAB in the last 72 hours. Anemia Panel: No results for input(s): VITAMINB12, FOLATE, FERRITIN, TIBC, IRON, RETICCTPCT in the last 72 hours.  --------------------------------------------------------------------------------------------------------------- Urine analysis:    Component Value Date/Time   COLORURINE YELLOW 07/12/2010 1337   APPEARANCEUR CLEAR 07/12/2010 1337   LABSPEC  >1.030 (H) 07/12/2010 1337   PHURINE 6.0 07/12/2010 1337   GLUCOSEU NEGATIVE 07/12/2010 1337   HGBUR NEGATIVE 07/12/2010 1337   BILIRUBINUR Small* 04/15/2017 1437   KETONESUR NEGATIVE 07/12/2010 1337   PROTEINUR 2+ 07/21/2017 1627   PROTEINUR 100 (A) 07/12/2010 1337   UROBILINOGEN 1.0 04/15/2017 1437   UROBILINOGEN 1.0 07/12/2010 1337   NITRITE neg 07/21/2017 1627   NITRITE NEGATIVE 07/12/2010 1337   LEUKOCYTESUR Negative 07/21/2017 1627      Imaging Results:    Dg Chest 2 View  Result Date: 03/23/2018 CLINICAL DATA:  Chest pain EXAM: CHEST - 2 VIEW COMPARISON:  09/08/2005 FINDINGS: The heart size and mediastinal contours are within normal limits. Both lungs are clear. The visualized skeletal structures are unremarkable. Mild bronchitic changes. IMPRESSION: Mild bronchitic changes.  No focal pulmonary infiltrate. Electronically Signed   By: Jasmine PangKim  Fujinaga M.D.   On: 03/23/2018  22:49    My personal review of EKG: Sinus tachycardia   Assessment & Plan:    Active Problems:   Respiratory alkalosis   Panic attack   Hyperglycemia   1. Respiratory alkalosis/anxiety disorder-secondary to hyperventilation from panic attack/anxiety.  Will start Ativan 1 mg p.o. every 4 hours as needed for anxiety. 2. Metabolic acidosis-patient's BMP shows bicarb of 16, which likely represents uncompensated respiratory alkalosis.  She does have anion gap of 17, there is a concern for possible DKA because her blood glucose is elevated to 237.  But patient was given prednisone 60 mg this afternoon which could have contributed to hyperglycemia.  Will repeat BMP now and in a.m. 3. Hyperglycemia-likely steroid-induced versus new onset diabetes mellitus.  Patient was given 60 mg of prednisone this afternoon for bronchitis, will start her on sliding scale insulin with NovoLog.  Check hemoglobin A1c to rule out diabetes mellitus 4. ?  Bronchitis-patient's lungs are clear to auscultation, will avoid albuterol as it can  worsen her panic attack.  Chest x-ray does show changes of mild bronchitis.    O2 sats 100% on room air.Will monitor.    DVT Prophylaxis-   Lovenox   AM Labs Ordered, also please review Full Orders  Family Communication: Admission, patients condition and plan of care including tests being ordered have been discussed with the patient and her mother at bedside who indicate understanding and agree with the plan and Code Status.  Code Status:  Full code  Admission status: Observation  Time spent in minutes : 60 min   Meredeth Ide M.D on 03/24/2018 at 12:42 AM  Between 7am to 7pm - Pager - [phone]. After 7pm go to [email or web address] - password [facility]  [facility] - Office  [phone]

## 2018-03-24 NOTE — ED Notes (Signed)
Dr Sharl MaLama notified of pt's bmet results,

## 2018-03-24 NOTE — ED Notes (Signed)
Dr Lama at bedside,  

## 2018-03-24 NOTE — Progress Notes (Signed)
*  PRELIMINARY RESULTS* Echocardiogram 2D Echocardiogram has been performed.  Stacey DrainWhite, Byrne Capek J 03/24/2018, 2:52 PM

## 2018-03-24 NOTE — Progress Notes (Addendum)
PROGRESS NOTE  Heidi Shields UJW:[id number] DOB: Jul 31, 1996 DOA: 03/23/2018 PCP: Jeoffrey Massed, MD  Brief History:  22 year old female with a history of anxiety presented with 1 day history of shortness of breath that began on the morning of 03/23/2018.  However, the patient had been complaining of a cough and sore throat that began on 03/20/2018.  The patient had some associated chest discomfort with shortness of breath, but she denied any fevers, chills, coughing, hemoptysis, nausea, vomiting, diarrhea, abdominal pain.  She denies any recent sick contacts.  She denies any tobacco use.  She recently drove to [place] on the weekend of 03/19/18-03/22/2018.  The patient takes oral contraceptives.  The patient had and e-visit with FNP on 03/23/18.  The patient was prescribed prednisone which she took 1 dose prior to coming to the emergency department.  In the emergency department, the patient was noted to be tachycardic with heart rate in 140s, dyspneic and tachypneic.  VBG showed pH 7.58 with pCO2 17.  She was noted to have WBC 15.1 with a gap metabolic acidosis.  Chest x-ray showed mild bronchitic changes.  The patient was admitted for further evaluation.  Assessment/Plan: Sinus tachycardia/dyspnea/respiratory alkalosis -Given the patient's recent trip to [place] in a setting of oral contraceptive use--> CT angiogram chest -Placed on telemetry -Echocardiogram -Personally reviewed EKG--sinus tachycardia, nonspecific ST changes -TSH -Free T4 -Continue IV fluids -Viral respiratory panel  Metabolic acidosis -Check lactic acid -Check ammonia -Hemoglobin A1c -Check salicylate level -Urine drug screen negative -am BMP  Hyperglycemia -Likely due to steroids -Hemoglobin A1c  Acute bronchitis -This may partly explain the patient's cough and shortness of breath with sore throat -Personally reviewed chest x-ray--mild increased interstitial markings -Viral respiratory  panel  Leukocytosis -Likely stress demargination with a combination of steroids -Remain off antibiotics; monitor clinically -A.m. CBC    Disposition Plan:   Home 4/11 if stable Family Communication:   Mother updated at bedside 4/10 Total time spent 35 minutes.  Greater than 50% spent face to face counseling and coordinating care. 5621 to 1030   Consultants:  none  Code Status:  FULL  DVT Prophylaxis:  [place] Lovenox   Procedures: As Listed in Progress Note Above  Antibiotics: None    Subjective: The patient still has some mild conversational dyspnea and a nonproductive cough.  She denies any chest pain, nausea, vomiting, diarrhea, abdominal pain, dysuria, hematuria, headache, neck pain.  Objective: Vitals:   03/24/18 0100 03/24/18 0130 03/24/18 0153 03/24/18 0800  BP: 107/74 107/71 105/79   Pulse:  93    Resp: 18 13    Temp:   97.8 F (36.6 C) 98.3 F (36.8 C)  TempSrc:   Oral Oral  SpO2: 98% 99%    Weight:   68.2 kg (150 lb 5.7 oz)   Height:   5\' 4"  (1.626 m)     Intake/Output Summary (Last 24 hours) at 03/24/2018 1030 Last data filed at 03/24/2018 0400 Gross per 24 hour  Intake 105 ml  Output -  Net 105 ml   Weight change:  Exam:   General:  Pt is alert, follows commands appropriately, not in acute distress  HEENT: No icterus, No thrush, No neck mass, [place]/AT  Cardiovascular: RRR, S1/S2, no rubs, no gallops  Respiratory: CTA bilaterally, no wheezing, no crackles, no rhonchi  Abdomen: Soft/+BS, non tender, non distended, no guarding  Extremities: No edema, No lymphangitis, No petechiae, No rashes, no synovitis  Data Reviewed: I have personally reviewed following labs and imaging studies Basic Metabolic Panel: Recent Labs  Lab 03/23/18 2215 03/24/18 0041 03/24/18 0412  NA 140 139 138  K 3.7 3.5 3.9  CL 107 110 110  CO2 16* 16* 20*  GLUCOSE 237* 155* 117*  BUN 10 9 9   CREATININE 0.78 0.63 0.50  CALCIUM 10.7* 9.6 8.5*   Liver Function  Tests: Recent Labs  Lab 03/24/18 0412  AST 13*  ALT 11*  ALKPHOS 39  BILITOT 0.7  PROT 6.8  ALBUMIN 3.4*   No results for input(s): LIPASE, AMYLASE in the last 168 hours. No results for input(s): AMMONIA in the last 168 hours. Coagulation Profile: No results for input(s): INR, PROTIME in the last 168 hours. CBC: Recent Labs  Lab 03/23/18 2215 03/24/18 0412  WBC 15.1* 13.7*  HGB 14.1 11.3*  HCT 38.9 32.2*  MCV 82.8 83.9  PLT 274 223   Cardiac Enzymes: No results for input(s): CKTOTAL, CKMB, CKMBINDEX, TROPONINI in the last 168 hours. BNP: Invalid input(s): POCBNP CBG: Recent Labs  Lab 03/24/18 0414 03/24/18 0808  GLUCAP 109* 79   HbA1C: No results for input(s): HGBA1C in the last 72 hours. Urine analysis:    Component Value Date/Time   COLORURINE YELLOW 07/12/2010 1337   APPEARANCEUR CLEAR 07/12/2010 1337   LABSPEC >1.030 (H) 07/12/2010 1337   PHURINE 6.0 07/12/2010 1337   GLUCOSEU NEGATIVE 07/12/2010 1337   HGBUR NEGATIVE 07/12/2010 1337   BILIRUBINUR Small* 04/15/2017 1437   KETONESUR NEGATIVE 07/12/2010 1337   PROTEINUR 2+ 07/21/2017 1627   PROTEINUR 100 (A) 07/12/2010 1337   UROBILINOGEN 1.0 04/15/2017 1437   UROBILINOGEN 1.0 07/12/2010 1337   NITRITE neg 07/21/2017 1627   NITRITE NEGATIVE 07/12/2010 1337   LEUKOCYTESUR Negative 07/21/2017 1627   Sepsis Labs: @LABRCNTIP (procalcitonin:4,lacticidven:4) ) Recent Results (from the past 240 hour(s))  MRSA PCR Screening     Status: None   Collection Time: 03/24/18  1:46 AM  Result Value Ref Range Status   MRSA by PCR NEGATIVE NEGATIVE Final    Comment:        The GeneXpert MRSA Assay (FDA approved for NASAL specimens only), is one component of a comprehensive MRSA colonization surveillance program. It is not intended to diagnose MRSA infection nor to guide or monitor treatment for MRSA infections. Performed at [facility], [address] [place], [place] [id number]      Scheduled Meds: .  enoxaparin (LOVENOX) injection  40 mg Subcutaneous Q24H  . insulin aspart  0-9 Units Subcutaneous TID WC   Continuous Infusions: . sodium chloride 75 mL/hr at 03/24/18 0400    Procedures/Studies: Dg Chest 2 View  Result Date: 03/23/2018 CLINICAL DATA:  Chest pain EXAM: CHEST - 2 VIEW COMPARISON:  09/08/2005 FINDINGS: The heart size and mediastinal contours are within normal limits. Both lungs are clear. The visualized skeletal structures are unremarkable. Mild bronchitic changes. IMPRESSION: Mild bronchitic changes.  No focal pulmonary infiltrate. Electronically Signed   By: Jasmine PangKim  Fujinaga M.D.   On: 03/23/2018 22:49    Catarina Hartshornavid Frenchie Dangerfield, DO  [facility] Pager [phone]  If 7PM-7AM, please contact night-coverage [email or web address] Password [email or web address] 03/24/2018, 10:30 AM   LOS: 0 days

## 2018-03-25 DIAGNOSIS — J208 Acute bronchitis due to other specified organisms: Secondary | ICD-10-CM | POA: Diagnosis not present

## 2018-03-25 DIAGNOSIS — E872 Acidosis: Secondary | ICD-10-CM | POA: Diagnosis not present

## 2018-03-25 DIAGNOSIS — R911 Solitary pulmonary nodule: Secondary | ICD-10-CM | POA: Diagnosis not present

## 2018-03-25 DIAGNOSIS — R0600 Dyspnea, unspecified: Secondary | ICD-10-CM | POA: Diagnosis not present

## 2018-03-25 DIAGNOSIS — J209 Acute bronchitis, unspecified: Secondary | ICD-10-CM | POA: Diagnosis not present

## 2018-03-25 DIAGNOSIS — R000 Tachycardia, unspecified: Secondary | ICD-10-CM | POA: Diagnosis not present

## 2018-03-25 DIAGNOSIS — D72829 Elevated white blood cell count, unspecified: Secondary | ICD-10-CM | POA: Diagnosis not present

## 2018-03-25 DIAGNOSIS — F410 Panic disorder [episodic paroxysmal anxiety] without agoraphobia: Secondary | ICD-10-CM | POA: Diagnosis not present

## 2018-03-25 DIAGNOSIS — E873 Alkalosis: Secondary | ICD-10-CM | POA: Diagnosis not present

## 2018-03-25 DIAGNOSIS — F329 Major depressive disorder, single episode, unspecified: Secondary | ICD-10-CM | POA: Diagnosis not present

## 2018-03-25 DIAGNOSIS — R739 Hyperglycemia, unspecified: Secondary | ICD-10-CM

## 2018-03-25 DIAGNOSIS — E874 Mixed disorder of acid-base balance: Secondary | ICD-10-CM | POA: Diagnosis not present

## 2018-03-25 LAB — RESPIRATORY PANEL BY PCR
ADENOVIRUS-RVPPCR: NOT DETECTED
Bordetella pertussis: NOT DETECTED
CORONAVIRUS NL63-RVPPCR: NOT DETECTED
CORONAVIRUS OC43-RVPPCR: NOT DETECTED
Chlamydophila pneumoniae: NOT DETECTED
Coronavirus 229E: NOT DETECTED
Coronavirus HKU1: NOT DETECTED
INFLUENZA A-RVPPCR: NOT DETECTED
INFLUENZA B-RVPPCR: NOT DETECTED
METAPNEUMOVIRUS-RVPPCR: NOT DETECTED
Mycoplasma pneumoniae: NOT DETECTED
PARAINFLUENZA VIRUS 1-RVPPCR: NOT DETECTED
PARAINFLUENZA VIRUS 2-RVPPCR: NOT DETECTED
PARAINFLUENZA VIRUS 4-RVPPCR: NOT DETECTED
Parainfluenza Virus 3: NOT DETECTED
RESPIRATORY SYNCYTIAL VIRUS-RVPPCR: NOT DETECTED
Rhinovirus / Enterovirus: NOT DETECTED

## 2018-03-25 LAB — BASIC METABOLIC PANEL
Anion gap: 8 (ref 5–15)
BUN: 8 mg/dL (ref 6–20)
CO2: 22 mmol/L (ref 22–32)
Calcium: 8.6 mg/dL — ABNORMAL LOW (ref 8.9–10.3)
Chloride: 108 mmol/L (ref 101–111)
Creatinine, Ser: 0.48 mg/dL (ref 0.44–1.00)
GFR calc Af Amer: >60 mL/min (ref >60)
Glucose, Bld: 78 mg/dL (ref 65–99)
Potassium: 4.5 mmol/L (ref 3.5–5.1)
Sodium: 138 mmol/L (ref 135–145)

## 2018-03-25 LAB — GLUCOSE, CAPILLARY
GLUCOSE-CAPILLARY: 84 mg/dL (ref 65–99)
Glucose-Capillary: 76 mg/dL (ref 65–99)
Glucose-Capillary: 96 mg/dL (ref 65–99)

## 2018-03-25 LAB — CBC WITH DIFFERENTIAL/PLATELET
BASOS ABS: 0 10*3/uL (ref 0.0–0.1)
BASOS PCT: 0 %
Eosinophils Absolute: 0.3 10*3/uL (ref 0.0–0.7)
Eosinophils Relative: 3 %
HEMATOCRIT: 34.7 % — AB (ref 36.0–46.0)
Hemoglobin: 11.9 g/dL — ABNORMAL LOW (ref 12.0–15.0)
LYMPHS PCT: 24 %
Lymphs Abs: 2.4 10*3/uL (ref 0.7–4.0)
MCH: 29.5 pg (ref 26.0–34.0)
MCHC: 34.3 g/dL (ref 30.0–36.0)
MCV: 86.1 fL (ref 78.0–100.0)
MONO ABS: 1 10*3/uL (ref 0.1–1.0)
Monocytes Relative: 10 %
NEUTROS ABS: 6.2 10*3/uL (ref 1.7–7.7)
Neutrophils Relative %: 63 %
PLATELETS: 217 10*3/uL (ref 150–400)
RBC: 4.03 MIL/uL (ref 3.87–5.11)
RDW: 12.6 % (ref 11.5–15.5)
WBC: 9.9 10*3/uL (ref 4.0–10.5)

## 2018-03-25 LAB — HIV ANTIBODY (ROUTINE TESTING W REFLEX): HIV SCREEN 4TH GENERATION: NONREACTIVE

## 2018-03-25 LAB — MAGNESIUM: MAGNESIUM: 1.8 mg/dL (ref 1.7–2.4)

## 2018-03-25 NOTE — Progress Notes (Signed)
Patient discharged to home, taken to car.

## 2018-03-25 NOTE — Discharge Summary (Signed)
Physician Discharge Summary  Heidi Shields ZOX:[id number] DOB: 1996-06-15 DOA: 03/23/2018  PCP: Jeoffrey Massed, MD  Admit date: 03/23/2018 Discharge date: 03/25/2018  Admitted From: Home Disposition:  Home  Recommendations for Outpatient Follow-up:  1. Follow up with PCP in 1-2 weeks 2. Please obtain BMP/CBC in one week   Discharge Condition: Stable CODE STATUS: Diet recommendation:  Regular   Brief/Interim Summary: 22 year old female with a history of anxiety presented with 1 day history of shortness of breath that began on the morning of 03/23/2018.  However, the patient had been complaining of a cough and sore throat that began on 03/20/2018.  The patient had some associated chest discomfort with shortness of breath, but she denied any fevers, chills, coughing, hemoptysis, nausea, vomiting, diarrhea, abdominal pain.  She denies any recent sick contacts.  She denies any tobacco use.  She recently drove to [place] on the weekend of 03/19/18-03/22/2018.  The patient takes oral contraceptives.  The patient had and e-visit with FNP on 03/23/18.  The patient was prescribed prednisone which she took 1 dose prior to coming to the emergency department.  In the emergency department, the patient was noted to be tachycardic with heart rate in 140s, dyspneic and tachypneic.  VBG showed pH 7.58 with pCO2 17.  She was noted to have WBC 15.1 with a gap metabolic acidosis.  Chest x-ray showed mild bronchitic changes.  The patient was admitted for further evaluation.    Discharge Diagnoses:  Sinus tachycardia/dyspnea/respiratory alkalosis -Given the patient's recent trip to [place] in a setting of oral contraceptive use--> CT angiogram chest--> negative for pulmonary embolus, edema, consolidation -Placed on telemetry--no concerning dysrhythmia -Echocardiogram--EF 60-65%, no WMA, trivial MR/TR -Personally reviewed EKG--sinus tachycardia, nonspecific ST changes -TSH--2.767 -Free  T4--0.82 -Continue IV fluids -Viral respiratory panel--negative -The patient likely experienced a slightly diuresis from her prednisone resulting in volume depletion and metabolic acidosis -Her metabolic acidosis likely contributing to her dyspnea -Improved with aggressive fluid resuscitation  Metabolic acidosis -Patient likely developed a solute diuresis resulting in a degree of dehydration and volume depletion -Check lactic acid--1.1 -Check ammonia--28 -Hemoglobin A1c--3.9 -Check salicylate level--neg -Urine drug screen negative -Overall improved with aggressive fluid resuscitation  Hyperglycemia -Likely due to steroids -Hemoglobin A1c--3.9  Acute bronchitis -This may partly explain the patient's cough and shortness of breath with sore throat -Personally reviewed chest x-ray--mild increased interstitial markings -Viral respiratory panel--neg  Leukocytosis -Likely stress demargination with a combination of steroids -Remain off antibiotics; monitor clinically -WBC improved off steroids -she remained afebrile and hemodynamically stable  LLL lung nodule -likely benign -out patient surveillance    Discharge Instructions   Allergies as of 03/25/2018   No Known Allergies     Medication List    STOP taking these medications   predniSONE 10 MG (21) Tbpk tablet Commonly known as:  STERAPRED UNI-PAK 21 TAB     TAKE these medications   norgestimate-ethinyl estradiol 0.25-35 MG-MCG tablet Commonly known as:  ORTHO-CYCLEN,SPRINTEC,PREVIFEM Take 1 tablet by mouth daily.       No Known Allergies  Consultations:  none   Procedures/Studies: Dg Chest 2 View  Result Date: 03/23/2018 CLINICAL DATA:  Chest pain EXAM: CHEST - 2 VIEW COMPARISON:  09/08/2005 FINDINGS: The heart size and mediastinal contours are within normal limits. Both lungs are clear. The visualized skeletal structures are unremarkable. Mild bronchitic changes. IMPRESSION: Mild bronchitic changes.   No focal pulmonary infiltrate. Electronically Signed   By: Jasmine Pang M.D.   On: 03/23/2018  22:49   Ct Angio Chest Pe W Or Wo Contrast  Result Date: 03/24/2018 CLINICAL DATA:  Shortness of breath and chest pain EXAM: CT ANGIOGRAPHY CHEST WITH CONTRAST TECHNIQUE: Multidetector CT imaging of the chest was performed using the standard protocol during bolus administration of intravenous contrast. Multiplanar CT image reconstructions and MIPs were obtained to evaluate the vascular anatomy. CONTRAST:  80mL ISOVUE-370 IOPAMIDOL (ISOVUE-370) INJECTION 76% COMPARISON:  Chest radiograph March 23, 2018 FINDINGS: Cardiovascular: There is no demonstrable pulmonary embolus. There is no appreciable thoracic aortic aneurysm or dissection. Visualized great vessels appear unremarkable. There is no appreciable pericardial effusion or pericardial thickening. Mediastinum/Nodes: Visualized thyroid appears unremarkable. There is no appreciable thoracic adenopathy. There is thymic tissue in the anterior mediastinum, normal for age. No esophageal lesions are appreciable. Lungs/Pleura: There is no edema or consolidation. No pleural effusion or pleural thickening evident. On axial slice 92 series 6, there is a 3 mm nodular opacity in the lateral segment left lower lobe. No similar nodular lesions noted elsewhere. Upper Abdomen: Visualized upper abdominal regions appear normal. Musculoskeletal: There are no blastic or lytic bone lesions. No chest wall lesions evident. Review of the MIP images confirms the above findings. IMPRESSION: 1. No demonstrable pulmonary embolus. No thoracic aortic aneurysm or dissection. 2. No edema or consolidation. 3 mm nodular lesion lateral segment left lower lobe almost certainly benign. 3.  No evident thoracic adenopathy. Electronically Signed   By: Bretta Bang III M.D.   On: 03/24/2018 12:30        Discharge Exam: Vitals:   03/25/18 0600 03/25/18 0900  BP:    Pulse: 68   Resp: (!) 21    Temp:  98.7 F (37.1 C)  SpO2: 98%    Vitals:   03/25/18 0400 03/25/18 0500 03/25/18 0600 03/25/18 0900  BP:      Pulse: 88 70 68   Resp: 16 19 (!) 21   Temp: 98.5 F (36.9 C)   98.7 F (37.1 C)  TempSrc:    Oral  SpO2: 98% 97% 98%   Weight:      Height:        General: Pt is alert, awake, not in acute distress Cardiovascular: RRR, S1/S2 +, no rubs, no gallops Respiratory: CTA bilaterally, no wheezing, no rhonchi Abdominal: Soft, NT, ND, bowel sounds + Extremities: no edema, no cyanosis   The results of significant diagnostics from this hospitalization (including imaging, microbiology, ancillary and laboratory) are listed below for reference.    Significant Diagnostic Studies: Dg Chest 2 View  Result Date: 03/23/2018 CLINICAL DATA:  Chest pain EXAM: CHEST - 2 VIEW COMPARISON:  09/08/2005 FINDINGS: The heart size and mediastinal contours are within normal limits. Both lungs are clear. The visualized skeletal structures are unremarkable. Mild bronchitic changes. IMPRESSION: Mild bronchitic changes.  No focal pulmonary infiltrate. Electronically Signed   By: Jasmine Pang M.D.   On: 03/23/2018 22:49   Ct Angio Chest Pe W Or Wo Contrast  Result Date: 03/24/2018 CLINICAL DATA:  Shortness of breath and chest pain EXAM: CT ANGIOGRAPHY CHEST WITH CONTRAST TECHNIQUE: Multidetector CT imaging of the chest was performed using the standard protocol during bolus administration of intravenous contrast. Multiplanar CT image reconstructions and MIPs were obtained to evaluate the vascular anatomy. CONTRAST:  80mL ISOVUE-370 IOPAMIDOL (ISOVUE-370) INJECTION 76% COMPARISON:  Chest radiograph March 23, 2018 FINDINGS: Cardiovascular: There is no demonstrable pulmonary embolus. There is no appreciable thoracic aortic aneurysm or dissection. Visualized great vessels appear unremarkable. There is no  appreciable pericardial effusion or pericardial thickening. Mediastinum/Nodes: Visualized thyroid appears  unremarkable. There is no appreciable thoracic adenopathy. There is thymic tissue in the anterior mediastinum, normal for age. No esophageal lesions are appreciable. Lungs/Pleura: There is no edema or consolidation. No pleural effusion or pleural thickening evident. On axial slice 92 series 6, there is a 3 mm nodular opacity in the lateral segment left lower lobe. No similar nodular lesions noted elsewhere. Upper Abdomen: Visualized upper abdominal regions appear normal. Musculoskeletal: There are no blastic or lytic bone lesions. No chest wall lesions evident. Review of the MIP images confirms the above findings. IMPRESSION: 1. No demonstrable pulmonary embolus. No thoracic aortic aneurysm or dissection. 2. No edema or consolidation. 3 mm nodular lesion lateral segment left lower lobe almost certainly benign. 3.  No evident thoracic adenopathy. Electronically Signed   By: Bretta Bang III M.D.   On: 03/24/2018 12:30     Microbiology: Recent Results (from the past 240 hour(s))  MRSA PCR Screening     Status: None   Collection Time: 03/24/18  1:46 AM  Result Value Ref Range Status   MRSA by PCR NEGATIVE NEGATIVE Final    Comment:        The GeneXpert MRSA Assay (FDA approved for NASAL specimens only), is one component of a comprehensive MRSA colonization surveillance program. It is not intended to diagnose MRSA infection nor to guide or monitor treatment for MRSA infections. Performed at [facility], [address] [place], [place] [address]   Respiratory Panel by PCR     Status: None   Collection Time: 03/24/18  5:20 PM  Result Value Ref Range Status   Adenovirus NOT DETECTED NOT DETECTED Final   Coronavirus 229E NOT DETECTED NOT DETECTED Final   Coronavirus HKU1 NOT DETECTED NOT DETECTED Final   Coronavirus NL63 NOT DETECTED NOT DETECTED Final   Coronavirus OC43 NOT DETECTED NOT DETECTED Final   Metapneumovirus NOT DETECTED NOT DETECTED Final   Rhinovirus / Enterovirus NOT  DETECTED NOT DETECTED Final   Influenza A NOT DETECTED NOT DETECTED Final   Influenza B NOT DETECTED NOT DETECTED Final   Parainfluenza Virus 1 NOT DETECTED NOT DETECTED Final   Parainfluenza Virus 2 NOT DETECTED NOT DETECTED Final   Parainfluenza Virus 3 NOT DETECTED NOT DETECTED Final   Parainfluenza Virus 4 NOT DETECTED NOT DETECTED Final   Respiratory Syncytial Virus NOT DETECTED NOT DETECTED Final   Bordetella pertussis NOT DETECTED NOT DETECTED Final   Chlamydophila pneumoniae NOT DETECTED NOT DETECTED Final   Mycoplasma pneumoniae NOT DETECTED NOT DETECTED Final    Comment: Performed at [facility], [address], [place], [place] [address]     Labs: Basic Metabolic Panel: Recent Labs  Lab 03/23/18 2215 03/24/18 0041 03/24/18 0412 03/25/18 0359  NA 140 139 138 138  K 3.7 3.5 3.9 4.5  CL 107 110 110 108  CO2 16* 16* 20* 22  GLUCOSE 237* 155* 117* 78  BUN 10 9 9 8   CREATININE 0.78 0.63 0.50 0.48  CALCIUM 10.7* 9.6 8.5* 8.6*  MG  --   --   --  1.8   Liver Function Tests: Recent Labs  Lab 03/24/18 0412  AST 13*  ALT 11*  ALKPHOS 39  BILITOT 0.7  PROT 6.8  ALBUMIN 3.4*   No results for input(s): LIPASE, AMYLASE in the last 168 hours. Recent Labs  Lab 03/24/18 1058  AMMONIA 28   CBC: Recent Labs  Lab 03/23/18 2215 03/24/18  [id number] 03/25/18 0359  WBC 15.1* 13.7* 9.9  NEUTROABS  --   --  6.2  HGB 14.1 11.3* 11.9*  HCT 38.9 32.2* 34.7*  MCV 82.8 83.9 86.1  PLT 274 223 217   Cardiac Enzymes: No results for input(s): CKTOTAL, CKMB, CKMBINDEX, TROPONINI in the last 168 hours. BNP: Invalid input(s): POCBNP CBG: Recent Labs  Lab 03/24/18 1704 03/24/18 2004 03/25/18 0007 03/25/18 0403 03/25/18 0838  GLUCAP 83 135* 96 76 84    Time coordinating discharge:  34 minutes  Signed:  Catarina Hartshornavid Lorenz Donley, DO [facility] Pager: [phone] 03/25/2018, 9:33 AM

## 2018-03-26 ENCOUNTER — Telehealth: Payer: Self-pay

## 2018-03-26 NOTE — Telephone Encounter (Signed)
Noted: nurse phone contact with patient for TCM. ° °

## 2018-03-26 NOTE — Telephone Encounter (Signed)
Transition Care Management Follow-up Telephone Call  Admit date: 03/23/2018 Discharge date: 03/25/2018 DX: Sinus tachycardia/dyspnea/respiratory alkalosis, Metabolic acidosis, Hyperglycemia, Acute bronchitis,    How have you been since you were released from the hospital? "I feel better". Pt continues to have cough, pt taking Mucinex DM. Encouraged use of humidifier and OTC allergy medication.    Do you understand why you were in the hospital? yes   Do you understand the discharge instructions? yes   Where were you discharged to? Home.    Items Reviewed:  Medications reviewed: yes  Allergies reviewed: yes  Dietary changes reviewed: yes  Referrals reviewed: yes   Functional Questionnaire:   Activities of Daily Living (ADLs):   She states they are independent in the following: ambulation, bathing and hygiene, feeding, continence, grooming, toileting and dressing States they require assistance with the following: None.    Any transportation issues/concerns?: no   Any patient concerns? no   Confirmed importance and date/time of follow-up visits scheduled yes  Provider Appointment booked with PCP 04/07/18.   Confirmed with patient if condition begins to worsen call PCP or go to the ER.  Patient was given the office number and encouraged to call back with question or concerns.  : yes

## 2018-03-29 NOTE — Telephone Encounter (Signed)
Patient was in the hopsital a couple days last week with bronchitis and a reaction to prednisone. She has a hospital follow up on 04/07/18.. She is unable to sleep at night and i still has an awful cough. the hospitalist told her to just take Rub[place]usson until i can see my PCP. but its like she is having to pull all nighters because she is unable to sleep. Can she be seen for a cough before her follow up?

## 2018-03-29 NOTE — Telephone Encounter (Signed)
SW pt, apt made for 03/30/18 at 8:00am.

## 2018-03-29 NOTE — Telephone Encounter (Signed)
Please advise. Thanks.  

## 2018-03-29 NOTE — Telephone Encounter (Signed)
Sure

## 2018-03-30 ENCOUNTER — Encounter: Payer: Self-pay | Admitting: *Deleted

## 2018-03-30 ENCOUNTER — Ambulatory Visit (INDEPENDENT_AMBULATORY_CARE_PROVIDER_SITE_OTHER): Payer: 59 | Admitting: Family Medicine

## 2018-03-30 ENCOUNTER — Encounter: Payer: Self-pay | Admitting: Family Medicine

## 2018-03-30 VITALS — BP 122/77 | HR 110 | Temp 98.1°F | Resp 16 | Ht 63.75 in | Wt 150.1 lb

## 2018-03-30 DIAGNOSIS — J209 Acute bronchitis, unspecified: Secondary | ICD-10-CM | POA: Diagnosis not present

## 2018-03-30 DIAGNOSIS — R0602 Shortness of breath: Secondary | ICD-10-CM

## 2018-03-30 DIAGNOSIS — T50905D Adverse effect of unspecified drugs, medicaments and biological substances, subsequent encounter: Secondary | ICD-10-CM

## 2018-03-30 MED ORDER — HYDROCODONE-HOMATROPINE 5-1.5 MG/5ML PO SYRP: ORAL_SOLUTION | ORAL | 0 refills | Status: DC

## 2018-03-30 MED ORDER — ALPRAZOLAM 0.5 MG PO TABS: ORAL_TABLET | ORAL | 0 refills | Status: DC

## 2018-03-30 MED ORDER — AZITHROMYCIN 250 MG PO TABS: ORAL_TABLET | ORAL | 0 refills | Status: DC

## 2018-03-30 NOTE — Progress Notes (Signed)
OFFICE VISIT  03/30/2018   CC:  Chief Complaint  Patient presents with  . Cough    HPI:    Patient is a 22 y.o. Caucasian female who presents for cough. Onset about 10 d/a.  Cough constant, keeps her up a lot at night.  Coughs in fits.  No fever. No wheezing.  Still feeling a little SOB.  Cough is productive, occ rattle in chest. No pain in face or upper teeth.  Feeling fatigued mostly from lack of sleep. Appetite is down.  Drinking fluids ok.  No n/v.   At beginning of illness, pt had e-visit, was rx'd prednisone, took one dose and began to feel more SOB and generally ill.  Got to the point of gasping. Went to ED, was anxious and hyperventilating b/c of feeling SOB and was admitted b/c of metabolic acidosis.  W/u reviewed today and all was neg--she improved with IVF. It was felt she had her metabolic acidosis resulted from volume depletion (? Prednisone effect) and her SOB was in response to her met acidosis. CXR showed mild bronchitic changes.    Past Medical History:  Diagnosis Date  . Acne vulgaris   . Anxiety and depression   . Chlamydia 07/21/2017   + at GYN screening age 82.  Test of cure done and was negative (Dr. Elonda Husky).  . Hay fever   . History of mononucleosis syndrome 2012  . Irregular menstrual cycle     History reviewed. No pertinent surgical history.  Outpatient Medications Prior to Visit  Medication Sig Dispense Refill  . Dextromethorphan-guaiFENesin ([place] DM PO) Take by mouth.    . norgestimate-ethinyl estradiol (ORTHO-CYCLEN,SPRINTEC,PREVIFEM) 0.25-35 MG-MCG tablet Take 1 tablet by mouth daily. 1 Package 11   No facility-administered medications prior to visit.     Allergies  Allergen Reactions  . Prednisone Anaphylaxis    Elevated BP, HR and BS    ROS As per HPI  PE: Blood pressure 122/77, pulse (!) 110, temperature 98.1 F (36.7 C), temperature source Oral, resp. rate 16, height 5' 3.75" (1.619 m), weight 150 lb 2 oz (68.1 kg), last  menstrual period 03/14/2018, SpO2 100 %. VS: noted-mild tachycardia. Gen: alert, NAD, NONTOXIC APPEARING. HEENT: eyes without injection, drainage, or swelling.  Ears: EACs clear, TMs with normal light reflex and landmarks.  Nose: Clear rhinorrhea, with some dried, crusty exudate adherent to mildly injected mucosa.  No purulent d/c.  No paranasal sinus TTP.  No facial swelling.  Throat and mouth without focal lesion.  No pharyngial swelling, erythema, or exudate.   Neck: supple, no LAD.   LUNGS: CTA bilat, nonlabored resps.   CV: RRR, no m/r/g. EXT: no c/c/e SKIN: no rash   LABS:    Chemistry      Component Value Date/Time   NA 138 03/25/2018 0359   K 4.5 03/25/2018 0359   CL 108 03/25/2018 0359   CO2 22 03/25/2018 0359   BUN 8 03/25/2018 0359   CREATININE 0.48 03/25/2018 0359      Component Value Date/Time   CALCIUM 8.6 (L) 03/25/2018 0359   ALKPHOS 39 03/24/2018 0412   AST 13 (L) 03/24/2018 0412   ALT 11 (L) 03/24/2018 0412   BILITOT 0.7 03/24/2018 0412     Lab Results  Component Value Date   WBC 9.9 03/25/2018   HGB 11.9 (L) 03/25/2018   HCT 34.7 (L) 03/25/2018   MCV 86.1 03/25/2018   PLT 217 03/25/2018   Lab Results  Component Value Date  HGBA1C 3.9 (L) 03/24/2018    IMPRESSION AND PLAN:  1) Acute bronchitis: not improving at about 10 days into this. Pt had adverse response associated with prednisone recently, so we'll avoid steroids. Z-pack. Hycodan cough syrup, 1-2 tsp tid prn, #120 ml. Therapeutic expectations and side effect profile of medication discussed today.  Patient's questions answered. I do think anxiety plays a role in her SOB---feeling mild SOB, esp with cough "fits", she then feels anxious and this makes her hyperventilate. I've rx'd alprazolam 0.3m, 1-2 tabs bid prn anxiety, for her to take on as needed basis for the next few days until she begins to feel better. Out of work today, may return tomorrow.  2) Hosp f/u, acute resp illness with  SOB---she had URI with bronchitis that worsened acutely after taking a dose of prednisone.  She improved in hosp with aggressive IVF. Mild hyperglycemia and mild leukocytosis resolved in hosp.  Imaging normal except bronchitic changes on x-ray. D/c'd home on no new meds.  An After Visit Summary was printed and given to the patient.  FOLLOW UP: Return if symptoms worsen or fail to improve.  Signed:  PCrissie Sickles MD           03/30/2018

## 2018-04-07 ENCOUNTER — Inpatient Hospital Stay: Payer: BLUE CROSS/BLUE SHIELD | Admitting: Family Medicine

## 2018-04-12 ENCOUNTER — Encounter: Payer: Self-pay | Admitting: Family Medicine

## 2018-04-13 MED ORDER — SCOPOLAMINE 1 MG/3DAYS TD PT72: 1.0000 | MEDICATED_PATCH | TRANSDERMAL | 1 refills | Status: DC

## 2018-04-13 NOTE — Telephone Encounter (Signed)
I'll eRx scopolamine patches for her to use as needed for motion sickness.

## 2018-05-31 ENCOUNTER — Encounter: Payer: BLUE CROSS/BLUE SHIELD | Admitting: Family Medicine

## 2018-06-14 ENCOUNTER — Encounter: Payer: Self-pay | Admitting: Family Medicine

## 2018-06-14 ENCOUNTER — Ambulatory Visit (INDEPENDENT_AMBULATORY_CARE_PROVIDER_SITE_OTHER): Payer: 59 | Admitting: Family Medicine

## 2018-06-14 VITALS — BP 97/64 | HR 82 | Temp 98.0°F | Resp 16 | Ht 63.5 in | Wt 155.1 lb

## 2018-06-14 DIAGNOSIS — Z0000 Encounter for general adult medical examination without abnormal findings: Secondary | ICD-10-CM | POA: Diagnosis not present

## 2018-06-14 DIAGNOSIS — F410 Panic disorder [episodic paroxysmal anxiety] without agoraphobia: Secondary | ICD-10-CM | POA: Diagnosis not present

## 2018-06-14 DIAGNOSIS — E663 Overweight: Secondary | ICD-10-CM | POA: Diagnosis not present

## 2018-06-14 DIAGNOSIS — F411 Generalized anxiety disorder: Secondary | ICD-10-CM

## 2018-06-14 MED ORDER — DULOXETINE HCL 30 MG PO CPEP: 30.0000 mg | ORAL_CAPSULE | Freq: Every day | ORAL | 1 refills | Status: DC

## 2018-06-14 NOTE — Progress Notes (Signed)
Office Note 06/14/2018  CC:  Chief Complaint  Patient presents with  . Annual Exam    Pt is not fasting.     HPI:  Heidi Shields is a 22 y.o. White female who is here for annual health maintenance exam.  She does have signif GAD with some tendency towards panic about 4 times per month, starts to get dizzy and breath fast, break out in a sweat---gets overwhelmed.  She takes a xanax at these times and things calm down the rest of her day.  She does have an aversion to having to take a benzo, would rather avoid it if possible.  We once tried her on duloxetine and she tolerated it but after 3-4 days her life calmed down and she felt like she didin't need to take this kind of medication.  She denies hypomania or mania. She is frustrated with her wt---treadmill 30 min per day, focuses on low carb and fat intake, often skips supper.  Denies depression.  Admits to feeling tired most of the time, sleep is not good.  Past Medical History:  Diagnosis Date  . Acne vulgaris   . Anxiety and depression   . Chlamydia 07/21/2017   + at GYN screening age 41.  Test of cure done and was negative (Dr. Despina Hidden).  . Hay fever   . History of mononucleosis syndrome 2012  . Irregular menstrual cycle     History reviewed. No pertinent surgical history.  Family History  Problem Relation Age of Onset  . Breast cancer Other        Aunt  . Diabetes Father   . High blood pressure Paternal Grandfather   . High blood pressure Paternal Grandmother   . Thyroid disease Sister     Social History   Socioeconomic History  . Marital status: Single    Spouse name: Not on file  . Number of children: Not on file  . Years of education: Not on file  . Highest education level: Not on file  Occupational History  . Not on file  Social Needs  . Financial resource strain: Not on file  . Food insecurity:    Worry: Not on file    Inability: Not on file  . Transportation needs:    Medical: Not on file   Non-medical: Not on file  Tobacco Use  . Smoking status: Never Smoker  . Smokeless tobacco: Never Used  Substance and Sexual Activity  . Alcohol use: Yes    Comment: occ  . Drug use: No  . Sexual activity: Yes    Birth control/protection: Pill  Lifestyle  . Physical activity:    Days per week: Not on file    Minutes per session: Not on file  . Stress: Not on file  Relationships  . Social connections:    Talks on phone: Not on file    Gets together: Not on file    Attends religious service: Not on file    Active member of club or organization: Not on file    Attends meetings of clubs or organizations: Not on file    Relationship status: Not on file  . Intimate partner violence:    Fear of current or ex partner: Not on file    Emotionally abused: Not on file    Physically abused: Not on file    Forced sexual activity: Not on file  Other Topics Concern  . 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   HS grad: [employer].   Lives with mom, dad, 2 sisters, and 1 brother in [place], [place].   Works full time as of 05/2017.   No T/A/Ds.    Outpatient Medications Prior to Visit  Medication Sig Dispense Refill  . ALPRAZolam (XANAX) 0.5 MG tablet 1-2 tabs po bid prn anxiety 20 tablet 0  . norgestimate-ethinyl estradiol (ORTHO-CYCLEN,SPRINTEC,PREVIFEM) 0.25-35 MG-MCG tablet Take 1 tablet by mouth daily. 1 Package 11  . scopolamine (TRANSDERM-SCOP) 1 MG/3DAYS Place 1 patch (1.5 mg total) onto the skin every 3 (three) days. 4 patch 1  . azithromycin (ZITHROMAX) 250 MG tablet 2 tabs po qd x 1d, then 1 tab po qd x 4d (Patient not taking: Reported on 06/14/2018) 6 tablet 0  . Dextromethorphan-guaiFENesin (MUCINEX DM PO) Take by mouth.    Marland Kitchen. HYDROcodone-homatropine (HYCODAN) 5-1.5 MG/5ML syrup 1-2 tsp po tid prn cough (Patient not taking: Reported on 06/14/2018) 120 mL 0   No facility-administered medications prior to  visit.     Allergies  Allergen Reactions  . Prednisone Anaphylaxis    Elevated BP, HR and BS    ROS Review of Systems  Constitutional: Negative for appetite change, chills, fatigue and fever.  HENT: Negative for congestion, dental problem, ear pain and sore throat.   Eyes: Negative for discharge, redness and visual disturbance.  Respiratory: Negative for cough, chest tightness, shortness of breath and wheezing.   Cardiovascular: Negative for chest pain, palpitations and leg swelling.  Gastrointestinal: Negative for abdominal pain, blood in stool, diarrhea, nausea and vomiting.  Genitourinary: Negative for difficulty urinating, dysuria, flank pain, frequency, hematuria and urgency.  Musculoskeletal: Negative for arthralgias, back pain, joint swelling, myalgias and neck stiffness.  Skin: Negative for pallor and rash.  Neurological: Negative for dizziness, speech difficulty, weakness and headaches.  Hematological: Negative for adenopathy. Does not bruise/bleed easily.  Psychiatric/Behavioral: Negative for confusion, dysphoric mood and sleep disturbance. The patient is nervous/anxious.     PE; Blood pressure 97/64, pulse 82, temperature 98 F (36.7 C), temperature source Oral, resp. rate 16, height 5' 3.5" (1.613 m), weight 155 lb 2 oz (70.4 kg), last menstrual period 05/30/2018, SpO2 100 %. Body mass index is 27.05 kg/m. Pt examined with Pryor OchoaHeather Sutherland, CMA, as chaperone.  Gen: Alert, well appearing.  Patient is oriented to person, place, time, and situation. AFFECT: pleasant, lucid thought and speech. ENT: Ears: EACs clear, normal epithelium.  TMs with good light reflex and landmarks bilaterally.  Eyes: no injection, icteris, swelling, or exudate.  EOMI, PERRLA. Nose: no drainage or turbinate edema/swelling.  No injection or focal lesion.  Mouth: lips without lesion/swelling.  Oral mucosa pink and moist.  Dentition intact and without obvious caries or gingival swelling.   Oropharynx without erythema, exudate, or swelling.  Neck: supple/nontender.  No LAD, mass, or TM.  Carotid pulses 2+ bilaterally, without bruits. CV: RRR, no m/r/g.   LUNGS: CTA bilat, nonlabored resps, good aeration in all lung fields. ABD: soft, NT, ND, BS normal.  No hepatospenomegaly or mass.  No bruits. EXT: no clubbing, cyanosis, or edema.  Musculoskeletal: no joint swelling, erythema, warmth, or tenderness.  ROM of all joints intact. Skin - no sores or suspicious lesions or rashes or color changes   Pertinent labs:  Lab Results  Component Value Date   TSH 2.767 03/24/2018   Lab Results  Component Value Date   WBC 9.9 03/25/2018   HGB 11.9 (L) 03/25/2018  HCT 34.7 (L) 03/25/2018   MCV 86.1 03/25/2018   PLT 217 03/25/2018   Lab Results  Component Value Date   CREATININE 0.48 03/25/2018   BUN 8 03/25/2018   NA 138 03/25/2018   K 4.5 03/25/2018   CL 108 03/25/2018   CO2 22 03/25/2018   Lab Results  Component Value Date   ALT 11 (L) 03/24/2018   AST 13 (L) 03/24/2018   ALKPHOS 39 03/24/2018   BILITOT 0.7 03/24/2018   No results found for: CHOL No results found for: HDL No results found for: LDLCALC No results found for: TRIG No results found for: CHOLHDL Lab Results  Component Value Date   HGBA1C 3.9 (L) 03/24/2018     ASSESSMENT AND PLAN:     1) GAD with panic attacks: she is not coping very well with this---tries to ignore it--"it's like I'm in denial about it, and then it sometimes just gets overwhelming and I erupt".   She wants to stop using xanax---no new rx for this given today. Start duloxetine 30mg  qd today.  Therapeutic expectations and side effect profile of medication discussed today.  Patient's questions answered.   2) Health maintenance exam: Reviewed age and gender appropriate health maintenance issues (prudent diet, regular exercise, health risks of tobacco and excessive alcohol, use of seatbelts, fire alarms in home, use of sunscreen).   Also reviewed age and gender appropriate health screening as well as vaccine recommendations. Vaccines: Tdap due but pt declines--has severe needle phobia. Labs: CBC, CMET, TSH UTD when in hospital 03/2018.  Pt declines FLP. Cervical ca screening: to get pap in Aug this year with Dr. Despina Hidden. Discussed changing some of her dietary habits in order to limit calories but avoid starving herself long long periods each day.  Continue current exercise and try to build slowly on this.  An After Visit Summary was printed and given to the patient.  FOLLOW UP:  Return in about 1 month (around 07/12/2018) for f/u anx/panic/med.  Signed:  Santiago Bumpers, MD           06/14/2018

## 2018-06-14 NOTE — Patient Instructions (Signed)

## 2018-06-29 ENCOUNTER — Encounter: Payer: Self-pay | Admitting: Family Medicine

## 2018-06-29 NOTE — Telephone Encounter (Signed)
Please advise. Thanks.  

## 2018-07-02 ENCOUNTER — Telehealth: Payer: Self-pay | Admitting: Obstetrics & Gynecology

## 2018-07-02 MED ORDER — NORGESTIMATE-ETH ESTRADIOL 0.25-35 MG-MCG PO TABS: 1.0000 | ORAL_TABLET | Freq: Every day | ORAL | 11 refills | Status: DC

## 2018-07-02 NOTE — Telephone Encounter (Signed)
Pt called stating that she will need a refill on her birth control pills before she has her appt in Sept. Advised that I would send her request to a provider and she could check with her pharmacy in the next 24 hours. Pt verbalized understanding.

## 2018-07-02 NOTE — Telephone Encounter (Signed)
Patient called stating that she will need a refill of her birth control because she runs out on 8/5. Pt states that she needs a refill until her Pap/physical in 9/6. Pt states that she uses the [facility] in [place]. Please contact pt

## 2018-07-05 NOTE — Telephone Encounter (Signed)
Meds ordered this encounter  Medications  . norgestimate-ethinyl estradiol (ORTHO-CYCLEN,SPRINTEC,PREVIFEM) 0.25-35 MG-MCG tablet    Sig: Take 1 tablet by mouth daily.    Dispense:  1 Package    Refill:  11

## 2018-07-06 ENCOUNTER — Other Ambulatory Visit: Payer: Self-pay | Admitting: *Deleted

## 2018-07-06 ENCOUNTER — Encounter: Payer: Self-pay | Admitting: Family Medicine

## 2018-07-06 MED ORDER — MIRTAZAPINE 15 MG PO TABS: 15.0000 mg | ORAL_TABLET | Freq: Every day | ORAL | 0 refills | Status: DC

## 2018-07-06 NOTE — Telephone Encounter (Signed)
If her nausea symptoms started right after starting her duloxetine/cymbalta, then I strongly suspect this is a side effect and she needs to go ahead and stop this med. Pls eRx a different antidepressant to treat her symptoms called mirtazapine 15mg , 1 tab po qhs, #30, no RF. Have her change her 7/29 appt to about 1 month from now.-thx

## 2018-07-06 NOTE — Telephone Encounter (Signed)
Please advise. Thanks.  

## 2018-07-07 ENCOUNTER — Encounter: Payer: Self-pay | Admitting: Family Medicine

## 2018-07-12 ENCOUNTER — Ambulatory Visit: Payer: 59 | Admitting: Family Medicine

## 2018-07-26 ENCOUNTER — Other Ambulatory Visit: Payer: Self-pay | Admitting: Obstetrics & Gynecology

## 2018-08-03 ENCOUNTER — Encounter: Payer: Self-pay | Admitting: Family Medicine

## 2018-08-03 ENCOUNTER — Ambulatory Visit (INDEPENDENT_AMBULATORY_CARE_PROVIDER_SITE_OTHER): Payer: 59 | Admitting: Family Medicine

## 2018-08-03 VITALS — BP 99/68 | HR 85 | Temp 98.3°F | Resp 16 | Ht 63.5 in | Wt 162.4 lb

## 2018-08-03 DIAGNOSIS — F4323 Adjustment disorder with mixed anxiety and depressed mood: Secondary | ICD-10-CM | POA: Diagnosis not present

## 2018-08-03 NOTE — Progress Notes (Signed)
OFFICE VISIT  08/03/2018   CC:  Chief Complaint  Patient presents with  . Follow-up    Anxiety/Panic/Med    HPI:    Patient is a 22 y.o. Caucasian female who presents for 6 wk f/u anxiety with hx of panic. Plan at last visit: start duloxetine 30 mg qd and we chose to not continue her on alprazolam.  Had to switch her to mirtazapine b/c of nausea side effect from duloxetine. Says she is feeling less anxious, less irritable at work and home.  She works for [employer] and notes that she can handle the stress of this job better now.   No nausea.  She does feel drowsy in mornings, taking 15mg  mirtaz qhs.   She still finds herself stressing too hard on some details at work such as her schedule and keeping up with PAL time. She does not like the thought of doing counseling.  No depressed mood, just frustrated and overwhelmed at times still. She doesn't have consistency of schedule and eating habits, but says she is soon going back to 8 hour shifts and plans on staying with this instead of going back and forth between 8 and 12 hr shifts. She walks most days for her exercise---used to run but it did not help her lose wt so she decided to just go back to walking.   Past Medical History:  Diagnosis Date  . Acne vulgaris   . Anxiety and depression   . Chlamydia 07/21/2017   + at GYN screening age 22.  Test of cure done and was negative (Dr. Despina HiddenEure).  . Hay fever   . History of mononucleosis syndrome 2012  . Irregular menstrual cycle     History reviewed. No pertinent surgical history.  Outpatient Medications Prior to Visit  Medication Sig Dispense Refill  . ALPRAZolam (XANAX) 0.5 MG tablet 1-2 tabs po bid prn anxiety 20 tablet 0  . mirtazapine (REMERON) 15 MG tablet Take 1 tablet (15 mg total) by mouth at bedtime. 30 tablet 0  . norgestimate-ethinyl estradiol (ORTHO-CYCLEN,SPRINTEC,PREVIFEM) 0.25-35 MG-MCG tablet Take 1 tablet by mouth daily. 1 Package 11  . DULoxetine (CYMBALTA) 30 MG capsule  Take 1 capsule (30 mg total) by mouth daily. (Patient not taking: Reported on 08/03/2018) 30 capsule 1  . scopolamine (TRANSDERM-SCOP) 1 MG/3DAYS Place 1 patch (1.5 mg total) onto the skin every 3 (three) days. (Patient not taking: Reported on 08/03/2018) 4 patch 1   No facility-administered medications prior to visit.     Allergies  Allergen Reactions  . Prednisone Anaphylaxis    Elevated BP, HR and BS  . Duloxetine Nausea Only    ROS As per HPI  PE: Blood pressure 99/68, pulse 85, temperature 98.3 F (36.8 C), temperature source Oral, resp. rate 16, height 5' 3.5" (1.613 m), weight 162 lb 6 oz (73.7 kg), last menstrual period 07/19/2018, SpO2 100 %. Body mass index is 28.31 kg/m.  Gen: Alert, well appearing.  Patient is oriented to person, place, time, and situation. AFFECT: pleasant, lucid thought and speech.  She is anxious at times during our interview, also almost cries a couple of times when discussing her frustration with her inability to lose wt + the stress of her job.   LABS:    Chemistry      Component Value Date/Time   NA 138 03/25/2018 0359   K 4.5 03/25/2018 0359   CL 108 03/25/2018 0359   CO2 22 03/25/2018 0359   BUN 8 03/25/2018 0359  CREATININE 0.48 03/25/2018 0359      Component Value Date/Time   CALCIUM 8.6 (L) 03/25/2018 0359   ALKPHOS 39 03/24/2018 0412   AST 13 (L) 03/24/2018 0412   ALT 11 (L) 03/24/2018 0412   BILITOT 0.7 03/24/2018 0412     Lab Results  Component Value Date   TSH 2.767 03/24/2018    IMPRESSION AND PLAN:  1) Anxiety: almost exclusively surrounding her work situation/environment. She is improved on mirtazapine 15mg  qhs.  Says he mom can tell a difference in her as well. Rec'd she take her mirtazapine 2 hours before bedtime. Rec'd she continue to consider restarting her alprazolam tid prn.  She is hesitant to use this med but admits it has been helpful when used on prn basis in the past. She declines counseling b/c of the  stigma she associates with this. I offered referral to a nonsurgical bariatric clinic to get more focused attention/guidance on diet plan and exercise plan + consideration of meds to help with wt loss.  She declined this for now. She vented some about her job--works at [employer]. She is frustrated with working there but wants to stick with it. She is embarrassed about the stigma/reputation that the [employer] has gotten since it started.  Lots of turnover and retraining frustrations.  Spent 30 min with pt today, with >50% of this time spent in counseling and care coordination regarding the above problems.  An After Visit Summary was printed and given to the patient.  FOLLOW UP: Return in about 4 weeks (around 08/31/2018) for f/u anx/dep.   Signed:  Santiago BumpersPhil McGowen, MD           08/03/2018

## 2018-08-04 ENCOUNTER — Other Ambulatory Visit: Payer: Self-pay | Admitting: Family Medicine

## 2018-08-05 MED ORDER — MIRTAZAPINE 15 MG PO TABS: 15.0000 mg | ORAL_TABLET | Freq: Every day | ORAL | 2 refills | Status: DC

## 2018-08-05 NOTE — Telephone Encounter (Signed)
RF request for mirtazapine LOV: 08/03/18 Next ov: 09/03/18 Last written: 07/06/18 #30 w/ 0RF  Please advise. Thanks.

## 2018-08-20 ENCOUNTER — Ambulatory Visit (INDEPENDENT_AMBULATORY_CARE_PROVIDER_SITE_OTHER): Payer: 59 | Admitting: Obstetrics & Gynecology

## 2018-08-20 ENCOUNTER — Other Ambulatory Visit (HOSPITAL_COMMUNITY)
Admission: RE | Admit: 2018-08-20 | Discharge: 2018-08-20 | Disposition: A | Discharge status: Home / Self Care | Payer: 59 | Source: Ambulatory Visit | Attending: Obstetrics & Gynecology | Admitting: Obstetrics & Gynecology

## 2018-08-20 ENCOUNTER — Encounter: Payer: Self-pay | Admitting: Obstetrics & Gynecology

## 2018-08-20 VITALS — BP 118/76 | HR 106 | Ht 64.0 in | Wt 163.0 lb

## 2018-08-20 DIAGNOSIS — Z01419 Encounter for gynecological examination (general) (routine) without abnormal findings: Secondary | ICD-10-CM | POA: Insufficient documentation

## 2018-08-20 DIAGNOSIS — F419 Anxiety disorder, unspecified: Secondary | ICD-10-CM | POA: Insufficient documentation

## 2018-08-20 DIAGNOSIS — L739 Follicular disorder, unspecified: Secondary | ICD-10-CM | POA: Insufficient documentation

## 2018-08-20 DIAGNOSIS — F329 Major depressive disorder, single episode, unspecified: Secondary | ICD-10-CM | POA: Insufficient documentation

## 2018-08-20 DIAGNOSIS — Z01411 Encounter for gynecological examination (general) (routine) with abnormal findings: Secondary | ICD-10-CM | POA: Diagnosis not present

## 2018-08-20 MED ORDER — NORGESTIMATE-ETH ESTRADIOL 0.25-35 MG-MCG PO TABS: 1.0000 | ORAL_TABLET | Freq: Every day | ORAL | 4 refills | Status: DC

## 2018-08-20 MED ORDER — SILVER SULFADIAZINE 1 % EX CREA
TOPICAL_CREAM | CUTANEOUS | 11 refills | Status: DC
Start: 2018-08-20 — End: 2018-12-03

## 2018-08-20 NOTE — Progress Notes (Signed)
Subjective:     Heidi Shields is a 22 y.o. female here for a routine exam.  Patient's last menstrual period was 08/15/2018 (exact date). [id number] Birth Control Method:  OCP Menstrual Calendar(currently): regular  Current complaints: post exercise irritation of hair follicles under both arms.   Current acute medical issues:  none   Recent Gynecologic History Patient's last menstrual period was 08/15/2018 (exact date). Last Pap: 2018,  normal Last mammogram: ,    Past Medical History:  Diagnosis Date  . Acne vulgaris   . Anxiety and depression   . Chlamydia 07/21/2017   + at GYN screening age 31.  Test of cure done and was negative (Dr. Despina Hidden).  . Hay fever   . History of mononucleosis syndrome 2012  . Irregular menstrual cycle     History reviewed. No pertinent surgical history.  OB History    Gravida  0   Para  0   Term  0   Preterm  0   AB  0   Living  0     SAB  0   TAB  0   Ectopic  0   Multiple  0   Live Births  0           Social History   Socioeconomic History  . Marital status: Single    Spouse name: Not on file  . Number of children: Not on file  . Years of education: Not on file  . Highest education level: Not on file  Occupational History  . Not on file  Social Needs  . Financial resource strain: Not on file  . Food insecurity:    Worry: Not on file    Inability: Not on file  . Transportation needs:    Medical: Not on file    Non-medical: Not on file  Tobacco Use  . Smoking status: Never Smoker  . Smokeless tobacco: Never Used  Substance and Sexual Activity  . Alcohol use: Yes    Comment: occ  . Drug use: No  . Sexual activity: Yes    Birth control/protection: Pill  Lifestyle  . Physical activity:    Days per week: Not on file    Minutes per session: Not on file  . Stress: Not on file  Relationships  . Social connections:    Talks on phone: Not on file    Gets together: Not on file    Attends religious service: Not  on file    Active member of club or organization: Not on file    Attends meetings of clubs or organizations: Not on file    Relationship status: Not on file  Other Topics Concern  . 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   HS grad: [employer].   Lives with mom, dad, 2 sisters, and 1 brother in [place], [place].   Works full time as of 05/2017.   No T/A/Ds.    Family History  Problem Relation Age of Onset  . Breast cancer Other        Aunt  . Diabetes Father   . High blood pressure Paternal Grandfather   . High blood pressure Paternal Grandmother   . Thyroid disease Sister      Current Outpatient Medications:  .  mirtazapine (REMERON) 15 MG tablet, Take 1 tablet (15 mg total) by mouth at bedtime., Disp: 30 tablet, Rfl: 2 .  norgestimate-ethinyl estradiol (ORTHO-CYCLEN,SPRINTEC,PREVIFEM) 0.25-35 MG-MCG tablet, Take 1 tablet by mouth daily., Disp: 3 Package, Rfl: 4 .  silver sulfADIAZINE (SILVADENE) 1 % cream, Use to area as directed, Disp: 50 g, Rfl: 11  Review of Systems  Review of Systems  Constitutional: Negative for fever, chills, weight loss, malaise/fatigue and diaphoresis.  HENT: Negative for hearing loss, ear pain, nosebleeds, congestion, sore throat, neck pain, tinnitus and ear discharge.   Eyes: Negative for blurred vision, double vision, photophobia, pain, discharge and redness.  Respiratory: Negative for cough, hemoptysis, sputum production, shortness of breath, wheezing and stridor.   Cardiovascular: Negative for chest pain, palpitations, orthopnea, claudication, leg swelling and PND.  Gastrointestinal: negative for abdominal pain. Negative for heartburn, nausea, vomiting, diarrhea, constipation, blood in stool and melena.  Genitourinary: Negative for dysuria, urgency, frequency, hematuria and flank pain.  Musculoskeletal: Negative for myalgias, back pain, joint pain and falls.  Skin:  Negative for itching and rash.  Neurological: Negative for dizziness, tingling, tremors, sensory change, speech change, focal weakness, seizures, loss of consciousness, weakness and headaches.  Endo/Heme/Allergies: Negative for environmental allergies and polydipsia. Does not bruise/bleed easily.  Psychiatric/Behavioral: Negative for depression, suicidal ideas, hallucinations, memory loss and substance abuse. The patient is not nervous/anxious and does not have insomnia.        Objective:  Blood pressure 118/76, pulse (!) 106, height 5\' 4"  (1.626 m), weight 163 lb (73.9 kg), last menstrual period 08/15/2018.   Physical Exam  Vitals reviewed. Constitutional: She is oriented to person, place, and time. She appears well-developed and well-nourished.  HENT:  Head: Normocephalic and atraumatic.        Right Ear: External ear normal.  Left Ear: External ear normal.  Nose: Nose normal.  Mouth/Throat: Oropharynx is clear and moist.  Eyes: Conjunctivae and EOM are normal. Pupils are equal, round, and reactive to light. Right eye exhibits no discharge. Left eye exhibits no discharge. No scleral icterus.  Neck: Normal range of motion. Neck supple. No tracheal deviation present. No thyromegaly present.  Cardiovascular: Normal rate, regular rhythm, normal heart sounds and intact distal pulses.  Exam reveals no gallop and no friction rub.   No murmur heard. Respiratory: Effort normal and breath sounds normal. No respiratory distress. She has no wheezes. She has no rales. She exhibits no tenderness.  GI: Soft. Bowel sounds are normal. She exhibits no distension and no mass. There is no tenderness. There is no rebound and no guarding.  Genitourinary:  Breasts no masses skin changes or nipple changes bilaterally      Vulva is normal without lesions Vagina is pink moist without discharge Cervix normal in appearance and pap is done Uterus is normal size shape and contour Adnexa is negative with normal  sized ovaries   Musculoskeletal: Normal range of motion. She exhibits no edema and no tenderness.  Neurological: She is alert and oriented to person, place, and time. She has normal reflexes. She displays normal reflexes. No cranial nerve deficit. She exhibits normal muscle tone. Coordination normal.  Skin: Skin is warm and dry. No rash noted. No erythema. No pallor.  Psychiatric: She has a normal mood and affect. Her behavior is normal. Judgment and thought content normal.       Medications Ordered at today's visit: Meds ordered this encounter  Medications  . norgestimate-ethinyl estradiol (ORTHO-CYCLEN,SPRINTEC,PREVIFEM) 0.25-35 MG-MCG tablet    Sig: Take 1 tablet by mouth daily.    Dispense:  3 Package    Refill:  4  . silver sulfADIAZINE (  SILVADENE) 1 % cream    Sig: Use to area as directed    Dispense:  50 g    Refill:  11    Other orders placed at today's visit: No orders of the defined types were placed in this encounter.     Assessment:    Healthy female exam.    Plan:    Contraception: OCP (estrogen/progesterone). Follow up in: 1 year. silvadene for axillary folliculitis     Return in about 1 year (around 08/21/2019) for yearly.

## 2018-08-24 LAB — CYTOLOGY - PAP
CHLAMYDIA, DNA PROBE: NEGATIVE
DIAGNOSIS: NEGATIVE
HPV (WINDOPATH): NOT DETECTED
Neisseria Gonorrhea: NEGATIVE

## 2018-08-26 ENCOUNTER — Encounter: Payer: Self-pay | Admitting: Family Medicine

## 2018-08-26 NOTE — Telephone Encounter (Signed)
Please advise. Thanks.  

## 2018-08-27 NOTE — Telephone Encounter (Signed)
Patient is calling to check the status of this before the weekend & would like a response in mychart. Thanks

## 2018-08-30 ENCOUNTER — Other Ambulatory Visit: Payer: Self-pay | Admitting: Obstetrics & Gynecology

## 2018-09-03 ENCOUNTER — Ambulatory Visit: Payer: 59 | Admitting: Family Medicine

## 2018-10-04 ENCOUNTER — Ambulatory Visit: Payer: 59 | Admitting: Family Medicine

## 2018-10-19 ENCOUNTER — Telehealth: Payer: 59 | Admitting: Physician Assistant

## 2018-10-19 DIAGNOSIS — R69 Illness, unspecified: Secondary | ICD-10-CM | POA: Diagnosis not present

## 2018-10-19 DIAGNOSIS — J111 Influenza due to unidentified influenza virus with other respiratory manifestations: Secondary | ICD-10-CM

## 2018-10-19 DIAGNOSIS — J069 Acute upper respiratory infection, unspecified: Secondary | ICD-10-CM | POA: Diagnosis not present

## 2018-10-19 MED ORDER — OSELTAMIVIR PHOSPHATE 75 MG PO CAPS: 75.0000 mg | ORAL_CAPSULE | Freq: Two times a day (BID) | ORAL | 0 refills | Status: DC

## 2018-10-19 NOTE — Progress Notes (Signed)

## 2018-11-23 ENCOUNTER — Encounter: Payer: Self-pay | Admitting: Family Medicine

## 2018-11-24 NOTE — Telephone Encounter (Signed)
I recommend seeing an eye doctor as her first step.

## 2018-12-03 ENCOUNTER — Ambulatory Visit (INDEPENDENT_AMBULATORY_CARE_PROVIDER_SITE_OTHER): Payer: 59 | Admitting: Family Medicine

## 2018-12-03 ENCOUNTER — Encounter: Payer: Self-pay | Admitting: Family Medicine

## 2018-12-03 VITALS — BP 106/70 | HR 87 | Temp 97.9°F | Resp 16 | Ht 64.0 in | Wt 161.0 lb

## 2018-12-03 DIAGNOSIS — F418 Other specified anxiety disorders: Secondary | ICD-10-CM

## 2018-12-03 DIAGNOSIS — H6122 Impacted cerumen, left ear: Secondary | ICD-10-CM

## 2018-12-03 DIAGNOSIS — J0100 Acute maxillary sinusitis, unspecified: Secondary | ICD-10-CM

## 2018-12-03 MED ORDER — AMOXICILLIN 875 MG PO TABS: 875.0000 mg | ORAL_TABLET | Freq: Two times a day (BID) | ORAL | 0 refills | Status: DC

## 2018-12-03 MED ORDER — ALPRAZOLAM 0.5 MG PO TABS: ORAL_TABLET | ORAL | 0 refills | Status: DC

## 2018-12-03 NOTE — Progress Notes (Signed)
OFFICE VISIT  12/03/2018   CC:  Chief Complaint  Patient presents with  . Sore Throat    5 days  . Otalgia  . Nasal Congestion   HPI:    Patient is a 22 y.o. Caucasian female who presents for sore throat. Onset 7 d/a with bad ST, then ears started getting swishing sensation, nasal congestion/stuffy, runny nose with PND.  No wheezing or SOB.  Just a bit of PND cough. No fever. Mucinex and generic cold/flu pills no help.  No nasal sprays.  Pt still has a few xanax that she takes sparingly for times of excessive stress/anxiety, particularly in situations at work.  She asks if she can still take this.  Her las rx I did for her was #20 back in 03/2018.  Past Medical History:  Diagnosis Date  . Acne vulgaris   . Anxiety and depression   . Chlamydia 07/21/2017   + at GYN screening age 22.  Test of cure done and was negative (Dr. Despina HiddenEure).  . Hay fever   . History of mononucleosis syndrome 2012  . Irregular menstrual cycle     History reviewed. No pertinent surgical history.  Outpatient Medications Prior to Visit  Medication Sig Dispense Refill  . norgestimate-ethinyl estradiol (ORTHO-CYCLEN,SPRINTEC,PREVIFEM) 0.25-35 MG-MCG tablet Take 1 tablet by mouth daily. 3 Package 4  . mirtazapine (REMERON) 15 MG tablet Take 1 tablet (15 mg total) by mouth at bedtime. (Patient not taking: Reported on 12/03/2018) 30 tablet 2  . oseltamivir (TAMIFLU) 75 MG capsule Take 1 capsule (75 mg total) by mouth 2 (two) times daily. (Patient not taking: Reported on 12/03/2018) 10 capsule 0  . silver sulfADIAZINE (SILVADENE) 1 % cream Use to area as directed (Patient not taking: Reported on 12/03/2018) 50 g 11   No facility-administered medications prior to visit.     Allergies  Allergen Reactions  . Prednisone Anaphylaxis    Elevated BP, HR and BS  . Duloxetine Nausea Only    ROS As per HPI  PE: Blood pressure 106/70, pulse 87, temperature 97.9 F (36.6 C), temperature source Oral, resp. rate  16, height 5\' 4"  (1.626 m), weight 161 lb (73 kg), SpO2 100 %. VS: noted--normal. Gen: alert, NAD, NONTOXIC APPEARING. HEENT: eyes without injection, drainage, or swelling.  Ears: Right EAC clear, TM with normal light reflex and landmarks.  Left EAC with 100% cerumen impaction, cannot visualize TM.  Nose: Dingy/milky rhinorrhea, with some dried, crusty exudate adherent to mildly injected mucosa.  No purulent d/c.  L>R paranasal sinus TTP.  No facial swelling.  Throat and mouth without focal lesion.  No pharyngial swelling, erythema, or exudate.   Neck: supple, no LAD.   LUNGS: CTA bilat, nonlabored resps.   CV: RRR, no m/r/g. EXT: no c/c/e SKIN: no rash    LABS:    Chemistry      Component Value Date/Time   NA 138 03/25/2018 0359   K 4.5 03/25/2018 0359   CL 108 03/25/2018 0359   CO2 22 03/25/2018 0359   BUN 8 03/25/2018 0359   CREATININE 0.48 03/25/2018 0359      Component Value Date/Time   CALCIUM 8.6 (L) 03/25/2018 0359   ALKPHOS 39 03/24/2018 0412   AST 13 (L) 03/24/2018 0412   ALT 11 (L) 03/24/2018 0412   BILITOT 0.7 03/24/2018 0412      IMPRESSION AND PLAN:  1) Acute maxillary sinusitis. Amoxil 875mg  bid x 10d. Saline nasal spray. Afrin otc 2 sprays qhs prn x  3 nights max, then stop this med.  Therapeutic expectations and side effect profile of medication discussed today.  Patient's questions answered.  2) Left cerumen impaction: irrigated completely clear by nurse today.   TM normal.  3) Situational anxiety: may continue to use alprazolam sparingly. Alprazolam 0.5mg , 1-2 bid prn, #20, no RF.  An After Visit Summary was printed and given to the patient.  FOLLOW UP: Return if symptoms worsen or fail to improve.  Signed:  Santiago BumpersPhil Dontavia Brand, MD           12/03/2018

## 2018-12-06 ENCOUNTER — Ambulatory Visit (INDEPENDENT_AMBULATORY_CARE_PROVIDER_SITE_OTHER): Payer: 59 | Admitting: Family

## 2018-12-06 ENCOUNTER — Encounter: Payer: Self-pay | Admitting: Family

## 2018-12-06 VITALS — BP 106/78 | HR 93 | Temp 98.2°F | Ht 64.0 in | Wt 161.1 lb

## 2018-12-06 DIAGNOSIS — R21 Rash and other nonspecific skin eruption: Secondary | ICD-10-CM

## 2018-12-06 MED ORDER — FAMOTIDINE 20 MG PO TABS: 20.0000 mg | ORAL_TABLET | Freq: Two times a day (BID) | ORAL | 0 refills | Status: DC

## 2018-12-06 MED ORDER — HYDROXYZINE HCL 10 MG PO TABS: 10.0000 mg | ORAL_TABLET | Freq: Three times a day (TID) | ORAL | 0 refills | Status: DC | PRN

## 2018-12-06 NOTE — Progress Notes (Signed)
Heidi Shields is a 22 y.o. female with the following history as recorded in [employer]:  Patient Active Problem List   Diagnosis Date Noted  . Respiratory alkalosis 03/24/2018  . Panic attack 03/24/2018  . Hyperglycemia 03/24/2018  . Metabolic acidosis 03/24/2018  . Sinus tachycardia 03/24/2018  . Acute bronchitis 03/24/2018  . Health maintenance examination 05/29/2016  . Headache disorder 03/10/2013  . Acne vulgaris 03/10/2013  . Irregular menstrual cycle 03/10/2013  . Encounter for other general counseling or advice on contraception 03/10/2013    Current Outpatient Medications  Medication Sig Dispense Refill  . ALPRAZolam (XANAX) 0.5 MG tablet 1-2 tabs po bid prn anxiety 20 tablet 0  . amoxicillin (AMOXIL) 875 MG tablet Take 1 tablet (875 mg total) by mouth 2 (two) times daily. 20 tablet 0  . norgestimate-ethinyl estradiol (ORTHO-CYCLEN,SPRINTEC,PREVIFEM) 0.25-35 MG-MCG tablet Take 1 tablet by mouth daily. 3 Package 4  . famotidine (PEPCID) 20 MG tablet Take 1 tablet (20 mg total) by mouth 2 (two) times daily. 30 tablet 0  . hydrOXYzine (ATARAX/VISTARIL) 10 MG tablet Take 1 tablet (10 mg total) by mouth 3 (three) times daily as needed. 30 tablet 0   No current facility-administered medications for this visit.     Allergies: Prednisone and Duloxetine  Past Medical History:  Diagnosis Date  . Acne vulgaris   . Anxiety and depression   . Chlamydia 07/21/2017   + at GYN screening age 22.  Test of cure done and was negative (Dr. Despina HiddenEure).  . Hay fever   . History of mononucleosis syndrome 2012  . Irregular menstrual cycle     History reviewed. No pertinent surgical history.  Family History  Problem Relation Age of Onset  . Breast cancer Other        Aunt  . Diabetes Father   . High blood pressure Paternal Grandfather   . High blood pressure Paternal Grandmother   . Thyroid disease Sister     Social History   Tobacco Use  . Smoking status: Never Smoker  . Smokeless  tobacco: Never Used  Substance Use Topics  . Alcohol use: Yes    Comment: occ    Subjective:  Patient presents with concerns for rash that has been "coming and going" x 3 weeks; seemed to originally start on her ankle; mostly localized around her waist, ankles;developed 2 new lesions on her neck this morning. Denies any itching- notes the areas just go away on their own; denies any new soaps, foods, detergents or medications. Has not recently stayed in a hotel; has not taken any type of antihistamine; Saw her PCP on Friday- did not discuss symptoms at that time as did not seem problematic; feels like rash has worsened over the weekend; was started on Amoxicillin on Friday- per patient, has taken in the past with no difficulty.   LMP- now   Objective:  Vitals:   12/06/18 1125  BP: 106/78  Pulse: 93  Temp: 98.2 F (36.8 C)  TempSrc: Oral  SpO2: 99%  Weight: 161 lb 1.9 oz (73.1 kg)  Height: 5\' 4"  (1.626 m)    General: Well developed, well nourished, in no acute distress  Skin : Warm and dry. Macular, erythematous lesions noted around waist band, lower ankles; 2 lesions noted on neck Head: Normocephalic and atraumatic  Eyes: Sclera and conjunctiva clear; pupils round and reactive to light; extraocular movements intact  Neck: Supple without thyromegaly, adenopathy  Lungs: Respirations unlabored;  Neurologic: Alert and oriented; speech intact; face  symmetrical; moves all extremities well; CNII-XII intact without focal deficit   Assessment:  1. Rash     Plan:  ? Etiology; discussed bed bugs but since she is not itching feel this is unlikely; ? Hives; will not stop Amoxicillin at this time as rash was present before she started the antibiotic but she needs to watch her symptoms very closely; Patient cannot take oral steroids; will try treating with combination of Atarax and pepcid; if symptoms persist, will need to consider dermatology follow-up.   No follow-ups on file.  No orders of  the defined types were placed in this encounter.   Requested Prescriptions   Signed Prescriptions Disp Refills  . hydrOXYzine (ATARAX/VISTARIL) 10 MG tablet 30 tablet 0    Sig: Take 1 tablet (10 mg total) by mouth 3 (three) times daily as needed.  . famotidine (PEPCID) 20 MG tablet 30 tablet 0    Sig: Take 1 tablet (20 mg total) by mouth 2 (two) times daily.

## 2019-04-17 ENCOUNTER — Telehealth: Payer: 59 | Admitting: Family

## 2019-04-17 DIAGNOSIS — L559 Sunburn, unspecified: Secondary | ICD-10-CM

## 2019-04-17 MED ORDER — NAPROXEN 500 MG PO TABS: 500.0000 mg | ORAL_TABLET | Freq: Two times a day (BID) | ORAL | 0 refills | Status: DC

## 2019-04-17 NOTE — Progress Notes (Signed)
We are sorry that you are not feeling well.  Here is how we plan to help!  Based on what you have shared with me, I'd like to share with you a treatment plan for sunburn.   Most sunburn is a first degree burn that turns the skin pink or red.  It can be painful to touch.  If you stayed in the sun for a prolonged period this might have progressed to a second degree burn with blistering!  Approximately 5 minutes was spent documenting and reviewing patient's chart.    Usually the pain and swelling starts after about 4 hours, peaks at 24 hours and begins to improve after 48 hours or about 2 days.  REMEMBER prolonged exposure to the sun increases your risk of skin cancer so use sunscreen before you go outside!  We will give you more information about sunscreen use later in your care plan.  Your sunburn can be managed by self-care at home.  Please use the following care guide to manage your sunburn.  If you symptoms worsen, you have other questions or concerns, or you develop any of the warnings signs listed in your care plan you will need to seek a face to face visit with a provider without waiting!  Home Care Advice for Treating Mild Sunburn:  1. I have sent in naprosyn 500 mg twice a day as needed for pain. Take with food and avoid other NSAID's such as motrin.  Do not take Ibuprofen if you have stomach problems, kidney disease or are pregnant.  Do not take Ibuprofen if you have been told by your doctor or pharmacist to avoid this class of drugs.  Do not take Acetaminophen if you have liver disease.  Read the package warnings on any medication that you take!  2.  Use a steroid cream on the affected skin.  If you apply an over the counter steroid       cream as soon as possible and repeat it three times a day it may reduce the pain and      and swelling.  Until you get the steroid cream you may start with a moistening cream      cream or aloe gel.  3. For second or third degree sunburn  with painful blistering, you can use an over the         counter product Burn Jel Plus Pain Relieving Gel. Apply in a thick even layer over the     affected area not more than 3 to 4 times daily If you need to cover the area to protect      it from friction of clothing, you can use Moist Burn Pads such as Hydrogel Burn Pads       which are available over the counter.  4.  Apply cool compresses to the burned areas several times a day.  5.  Avoid soap on the sunburned areas.  6.  Drink plenty of water.  It is easy to get dehydrated from prolong time in the sun      Outdoors.  7.  For any broken blisters:  Trim off the dead skin with fine scissors.  It is wise to clean the scissor with alcohol before use.  Apply antibiotic ointments to the blister.  Apply twice a day for three days.  There are triple antibiotic ointments with topical pain relievers available at stores.  Caution:leave intact blisters alone.  They are protecting the skin and will allow it to  heal.  8.  Taking Vitamin C orally may reduce sun damage to your skin.  Follow the      Instructions on the bottle.  The recommended adult dosage is 2 grams.  What to Expect:  1. Pain usually stops after 2 or 3 days. 2. Skin flaking and peeling usually occurs for 3-7 days after a sunburn.  Call your provider if:  1. You feel very weak or have difficulty standing. 2. Blister develops on your face. 3. You become sensitive to light because of eye pain. 4. Your skin looks infected (red streaks, puss or worsening tenderness after 48 hours. 5. You feel you should be seen.  Preventing Sunburns:  1. Apply 20-30 SPF sunscreen to your skin before going into the sun. 2. Reapply every 2-4 hours or after sweating or swimming. 3. Sunscreens protect from sunburns but do not completely prevent skin damage.  Wynelle Link exposure still increases your risk of premature aging and skin cancers.  Your e-visit answers were reviewed by a board certified  advanced clinical practitioner to complete your personal care plan.  Depending on the condition, your plan could have included both over the counter or prescription medications.  If there is a problem please reply  once you have received a response from your provider.  Your safety is important to [name].  If you have drug allergies check your prescription carefully.    You can use MyChart to ask questions about today's visit, request a non-urgent call back, or ask for a work or school excuse for 24 hours related to this e-Visit. If it has been greater than 24 hours you will need to follow up with your provider, or enter a new e-Visit to address those concerns.  You will get an e-mail in the next two days asking about your experience.  I hope that your e-visit has been valuable and will speed your recovery. Thank you for using e-visits.

## 2019-08-28 ENCOUNTER — Other Ambulatory Visit: Payer: Self-pay | Admitting: Obstetrics & Gynecology

## 2019-10-07 ENCOUNTER — Encounter: Payer: 59 | Admitting: Family Medicine

## 2019-10-11 ENCOUNTER — Encounter: Payer: Self-pay | Admitting: Family Medicine

## 2019-10-11 NOTE — Telephone Encounter (Signed)
Scheduled patient for a OV next week. Patient will call back if that doesn't work for her and r/s if needed.

## 2019-10-17 ENCOUNTER — Encounter: Payer: Self-pay | Admitting: Family Medicine

## 2019-10-17 ENCOUNTER — Ambulatory Visit (INDEPENDENT_AMBULATORY_CARE_PROVIDER_SITE_OTHER): Payer: 59 | Admitting: Family Medicine

## 2019-10-17 ENCOUNTER — Other Ambulatory Visit: Payer: Self-pay

## 2019-10-17 VITALS — BP 105/74 | HR 84 | Temp 98.3°F | Resp 16 | Ht 64.0 in | Wt 156.8 lb

## 2019-10-17 DIAGNOSIS — L309 Dermatitis, unspecified: Secondary | ICD-10-CM | POA: Diagnosis not present

## 2019-10-17 DIAGNOSIS — L700 Acne vulgaris: Secondary | ICD-10-CM

## 2019-10-17 MED ORDER — MINOCYCLINE HCL 100 MG PO CAPS: 100.0000 mg | ORAL_CAPSULE | Freq: Two times a day (BID) | ORAL | 1 refills | Status: DC

## 2019-10-17 NOTE — Progress Notes (Signed)
OFFICE VISIT  10/17/2019   CC:  Chief Complaint  Patient presents with  . Acne    bumps and redness on right side of face   HPI:    Patient is a 23 y.o. Caucasian female who presents for acne. Now working at [employer] in [place].  Her acne has been worse lately, progressed since covid and wearing mask all the time. Tried otc products.  Has been using tretinoin for the last 2 mo.   Was doing better on this last couple months but a few days ago things went back to looking bad. Doesn't wear makeup. The acne burns.  Cheeks and chin involved but nothing on forehead or nose or neck or shoulders/back.  Past Medical History:  Diagnosis Date  . Acne vulgaris   . Anxiety and depression   . Chlamydia 07/21/2017   + at GYN screening age 29.  Test of cure done and was negative (Dr. Despina Hidden).  . Hay fever   . History of mononucleosis syndrome 2012  . Irregular menstrual cycle     History reviewed. No pertinent surgical history.  Outpatient Medications Prior to Visit  Medication Sig Dispense Refill  . ALPRAZolam (XANAX) 0.5 MG tablet 1-2 tabs po bid prn anxiety 20 tablet 0  . famotidine (PEPCID) 20 MG tablet Take 1 tablet (20 mg total) by mouth 2 (two) times daily. 30 tablet 0  . hydrOXYzine (ATARAX/VISTARIL) 10 MG tablet Take 1 tablet (10 mg total) by mouth 3 (three) times daily as needed. 30 tablet 0  . naproxen (NAPROSYN) 500 MG tablet Take 1 tablet (500 mg total) by mouth 2 (two) times daily with a meal. 30 tablet 0  . SPRINTEC 28 0.25-35 MG-MCG tablet Take 1 tablet by mouth once daily 84 tablet 3  . amoxicillin (AMOXIL) 875 MG tablet Take 1 tablet (875 mg total) by mouth 2 (two) times daily. (Patient not taking: Reported on 10/17/2019) 20 tablet 0   No facility-administered medications prior to visit.     Allergies  Allergen Reactions  . Prednisone Anaphylaxis    Elevated BP, HR and BS  . Duloxetine Nausea Only    ROS As per HPI  PE: Blood pressure  105/74, pulse 84, temperature 98.3 F (36.8 C), temperature source Temporal, resp. rate 16, height 5\' 4"  (1.626 m), weight 156 lb 12.8 oz (71.1 kg), SpO2 99 %. Face: both cheeks with moderate amount of comedonal lesions, some papules, some erythematous macules.  No cysts.  Only 1 or 2 papules on chin.  Forehead, neck, back, and shoulders spared. She has a patch of skin with mild erythema and dry feeling on R anterior neck region -separate from her acne rash.  LABS:    Chemistry      Component Value Date/Time   NA 138 03/25/2018 0359   K 4.5 03/25/2018 0359   CL 108 03/25/2018 0359   CO2 22 03/25/2018 0359   BUN 8 03/25/2018 0359   CREATININE 0.48 03/25/2018 0359      Component Value Date/Time   CALCIUM 8.6 (L) 03/25/2018 0359   ALKPHOS 39 03/24/2018 0412   AST 13 (L) 03/24/2018 0412   ALT 11 (L) 03/24/2018 0412   BILITOT 0.7 03/24/2018 0412     Lab Results  Component Value Date   WBC 9.9 03/25/2018   HGB 11.9 (L) 03/25/2018   HCT 34.7 (L) 03/25/2018   MCV 86.1 03/25/2018   PLT 217 03/25/2018   IMPRESSION AND PLAN:  1) Moderate acne vulgaris.  Stop tretinoin cream. Start minocycline 100mg  bid. Start benzaclin gel at f/u 1 mo if not significant improvement.  2) Eczema on neck: bothered more due to covid recently. Try to avoid abrasion/contact of mask on neck. OTC hydrocortisone ointment to affected area on neck.  An After Visit Summary was printed and given to the patient.  FOLLOW UP: Return for keep appt scheduled next month for CPE.  Signed:  Crissie Sickles, MD           10/17/2019

## 2019-10-21 NOTE — Telephone Encounter (Signed)
It is not uncommon for this med to cause some nausea.  Make sure you take this med with some food.  Also, try taking just one capsule a day for 5d and if nausea is significantly better or gone then increase to 1 twice a day. Should get better gradually but if still unable to tolerate it in 1 wk then let me know and we'll try something different. -PM

## 2019-10-21 NOTE — Telephone Encounter (Signed)
Please advise, thanks.

## 2019-11-18 ENCOUNTER — Other Ambulatory Visit: Payer: Self-pay

## 2019-11-18 ENCOUNTER — Encounter: Payer: Self-pay | Admitting: Family Medicine

## 2019-11-18 ENCOUNTER — Ambulatory Visit (INDEPENDENT_AMBULATORY_CARE_PROVIDER_SITE_OTHER): Payer: 59 | Admitting: Family Medicine

## 2019-11-18 VITALS — BP 122/80 | HR 79 | Temp 98.4°F | Resp 16 | Wt 161.0 lb

## 2019-11-18 DIAGNOSIS — Z8619 Personal history of other infectious and parasitic diseases: Secondary | ICD-10-CM

## 2019-11-18 DIAGNOSIS — L700 Acne vulgaris: Secondary | ICD-10-CM | POA: Diagnosis not present

## 2019-11-18 DIAGNOSIS — Z0000 Encounter for general adult medical examination without abnormal findings: Secondary | ICD-10-CM | POA: Diagnosis not present

## 2019-11-18 MED ORDER — ALPRAZOLAM 0.5 MG PO TABS: ORAL_TABLET | ORAL | 0 refills | Status: DC

## 2019-11-18 MED ORDER — CLINDAMYCIN PHOS-BENZOYL PEROX 1-5 % EX GEL: CUTANEOUS | 3 refills | Status: DC

## 2019-11-18 NOTE — Addendum Note (Signed)
Addended by: Tammi Sou on: 11/18/2019 06:01 PM   Modules accepted: Orders

## 2019-11-18 NOTE — Progress Notes (Signed)
Office Note 11/18/2019  CC:  Chief Complaint  Patient presents with  . Annual Exam   HPI:  Heidi Shields is a 23 y.o. White female who is here for annual health maintenance exam. GYN MD Dr. Despina Hidden.  GAD: helped by alprazolam 0.5mg , 1-2 bid prn.  Has only used this about 4 times this year. PMP AWARE reviewed today: most recent alpraz rx filled 12/03/18, #20, rx by me.  No red flags.  Of note, I started her on minocycline for acne about 1 mo ago. Only able to take daily due to gastric upset taking it bid.  Acne overall some improved but still fluctuates some. No topical med.  She is active but no formal exercise. Diet is fairly healthy. She does not smoke or drink.  Past Medical History:  Diagnosis Date  . Acne vulgaris   . Anxiety and depression   . Chlamydia 07/21/2017   + at GYN screening age 58.  Test of cure done and was negative (Dr. Despina Hidden).  . Hay fever   . History of mononucleosis syndrome 2012  . Irregular menstrual cycle     No past surgical history on file.  Family History  Problem Relation Age of Onset  . Breast cancer Other        Aunt  . Diabetes Father   . High blood pressure Paternal Grandfather   . High blood pressure Paternal Grandmother   . Thyroid disease Sister     Social History   Socioeconomic History  . Marital status: Single    Spouse name: Not on file  . Number of children: Not on file  . Years of education: Not on file  . Highest education level: Not on file  Occupational History  . Not on file  Social Needs  . Financial resource strain: Not on file  . Food insecurity    Worry: Not on file    Inability: Not on file  . Transportation needs    Medical: Not on file    Non-medical: Not on file  Tobacco Use  . Smoking status: Never Smoker  . Smokeless tobacco: Never Used  Substance and Sexual Activity  . Alcohol use: Yes    Comment: occ  . Drug use: No  . Sexual activity: Yes    Birth control/protection: Pill  Lifestyle   . Physical activity    Days per week: Not on file    Minutes per session: Not on file  . Stress: Not on file  Relationships  . Social Musician on phone: Not on file    Gets together: Not on file    Attends religious service: Not on file    Active member of club or organization: Not on file    Attends meetings of clubs or organizations: Not on file    Relationship status: Not on file  . Intimate partner violence    Fear of current or ex partner: Not on file    Emotionally abused: Not on file    Physically abused: Not on file    Forced sexual activity: Not on file  Other Topics Concern  . 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   HS grad: [employer].   Lives with mom, dad, 2 sisters, and 1 brother in [place], [place].   Works full time as of 05/2017.   No T/A/Ds.    Outpatient Medications  Prior to Visit  Medication Sig Dispense Refill  . ALPRAZolam (XANAX) 0.5 MG tablet 1-2 tabs po bid prn anxiety 20 tablet 0  . famotidine (PEPCID) 20 MG tablet Take 1 tablet (20 mg total) by mouth 2 (two) times daily. 30 tablet 0  . minocycline (MINOCIN) 100 MG capsule Take 1 capsule (100 mg total) by mouth 2 (two) times daily. 60 capsule 1  . naproxen (NAPROSYN) 500 MG tablet Take 1 tablet (500 mg total) by mouth 2 (two) times daily with a meal. 30 tablet 0  . SPRINTEC 28 0.25-35 MG-MCG tablet Take 1 tablet by mouth once daily 84 tablet 3  . hydrOXYzine (ATARAX/VISTARIL) 10 MG tablet Take 1 tablet (10 mg total) by mouth 3 (three) times daily as needed. (Patient not taking: Reported on 11/18/2019) 30 tablet 0   No facility-administered medications prior to visit.     Allergies  Allergen Reactions  . Prednisone Anaphylaxis    Elevated BP, HR and BS  . Duloxetine Nausea Only    ROS Review of Systems  Constitutional: Negative for appetite change, chills, fatigue and fever.  HENT: Negative for  congestion, dental problem, ear pain and sore throat.   Eyes: Negative for discharge, redness and visual disturbance.  Respiratory: Negative for cough, chest tightness, shortness of breath and wheezing.   Cardiovascular: Negative for chest pain, palpitations and leg swelling.  Gastrointestinal: Negative for abdominal pain, blood in stool, diarrhea, nausea and vomiting.  Genitourinary: Negative for difficulty urinating, dysuria, flank pain, frequency, hematuria and urgency.  Musculoskeletal: Negative for arthralgias, back pain, joint swelling, myalgias and neck stiffness.  Skin: Negative for pallor and rash.  Neurological: Negative for dizziness, speech difficulty, weakness and headaches.  Hematological: Negative for adenopathy. Does not bruise/bleed easily.  Psychiatric/Behavioral: Negative for confusion and sleep disturbance. The patient is not nervous/anxious.     PE; Blood pressure 122/80, pulse 79, temperature 98.4 F (36.9 C), temperature source Temporal, resp. rate 16, weight 161 lb (73 kg). Body mass index is 27.64 kg/m.  Exam chaperoned by Deveron Furlong, CMA. Gen: Alert, well appearing.  Patient is oriented to person, place, time, and situation. AFFECT: pleasant, lucid thought and speech. ENT: Ears: EACs clear, normal epithelium.  TMs with good light reflex and landmarks bilaterally.  Eyes: no injection, icteris, swelling, or exudate.  EOMI, PERRLA. Nose: no drainage or turbinate edema/swelling.  No injection or focal lesion.  Mouth: lips without lesion/swelling.  Oral mucosa pink and moist.  Dentition intact and without obvious caries or gingival swelling.  Oropharynx without erythema, exudate, or swelling.  Neck: supple/nontender.  No LAD, mass, or TM.  Carotid pulses 2+ bilaterally, without bruits. CV: RRR, no m/r/g.   LUNGS: CTA bilat, nonlabored resps, good aeration in all lung fields. ABD: soft, NT, ND, BS normal.  No hepatospenomegaly or mass.  No bruits. EXT: no  clubbing, cyanosis, or edema.  Musculoskeletal: no joint swelling, erythema, warmth, or tenderness.  ROM of all joints intact. Skin - no sores or suspicious lesions or color changes. Mild/mod scattered comedonal lesions on inf aspect of both cheeks and on her chin   Pertinent labs:  Lab Results  Component Value Date   TSH 2.767 03/24/2018   Lab Results  Component Value Date   WBC 9.9 03/25/2018   HGB 11.9 (L) 03/25/2018   HCT 34.7 (L) 03/25/2018   MCV 86.1 03/25/2018   PLT 217 03/25/2018   Lab Results  Component Value Date   CREATININE 0.48 03/25/2018   BUN 8  03/25/2018   NA 138 03/25/2018   K 4.5 03/25/2018   CL 108 03/25/2018   CO2 22 03/25/2018   Lab Results  Component Value Date   ALT 11 (L) 03/24/2018   AST 13 (L) 03/24/2018   ALKPHOS 39 03/24/2018   BILITOT 0.7 03/24/2018   No results found for: CHOL No results found for: HDL No results found for: LDLCALC No results found for: TRIG No results found for: CHOLHDL No results found for: PSA  Lab Results  Component Value Date   HGBA1C 3.9 (L) 03/24/2018   ASSESSMENT AND PLAN:   Health maintenance exam: Reviewed age and gender appropriate health maintenance issues (prudent diet, regular exercise, health risks of tobacco and excessive alcohol, use of seatbelts, fire alarms in home, use of sunscreen).  Also reviewed age and gender appropriate health screening as well as vaccine recommendations. Vaccines: Flu UTD.  Tdap due->she declines. Labs: she declines. Cervical ca screening: no hx of abnormal paps->pap smear q3 yrs->next due 2022.  Acne vulgaris: mild improvement on minocycline x 1 mo, only able to tolerate qd dosing. Continue this and will add benzaclin gel to use qd-bid. If not improved with this combo over the next 2 mo then next step is derm referral, may need accutane.  An After Visit Summary was printed and given to the patient.  FOLLOW UP:  Return in about 2 months (around 01/19/2020) for f/u acne,  VV.  Signed:  Santiago BumpersPhil McGowen, MD           11/18/2019

## 2019-12-11 IMAGING — CT CT ANGIO CHEST
2 of 7 series · 19 of 46 positions shown · IV contrast (Isovue)
Comparison: Chest radiograph March 23, 2018

CLINICAL DATA: Shortness of breath and chest pain

EXAM:
CT ANGIOGRAPHY CHEST WITH CONTRAST
TECHNIQUE: Multidetector CT imaging of the chest was performed using the
standard protocol during bolus administration of intravenous
contrast. Multiplanar CT image reconstructions and MIPs were
obtained to evaluate the vascular anatomy.
CONTRAST:  80mL [id number] IOPAMIDOL ([id number]) INJECTION 76%

[Series 5: thins · axial · 0.66mm/px · z∈[+1176,+1461]mm · 16 of 325 slices shown]
[im 20/325  lung]
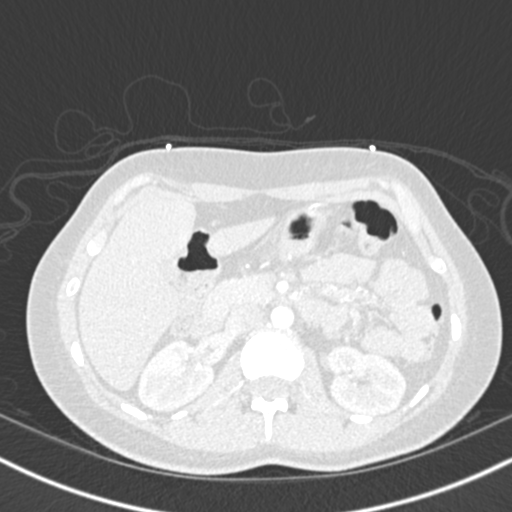
[im 39/325  soft-tissue]
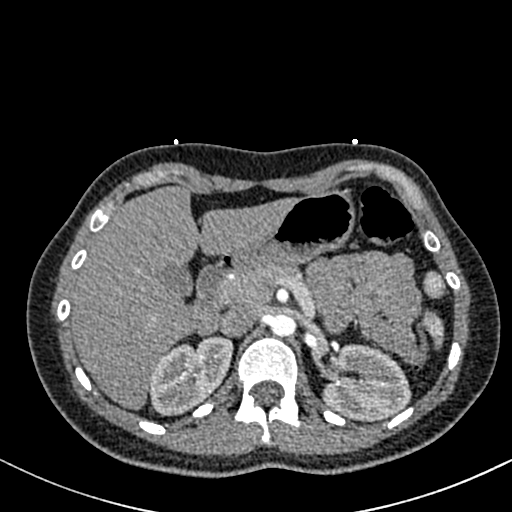
[im 58/325  lung]
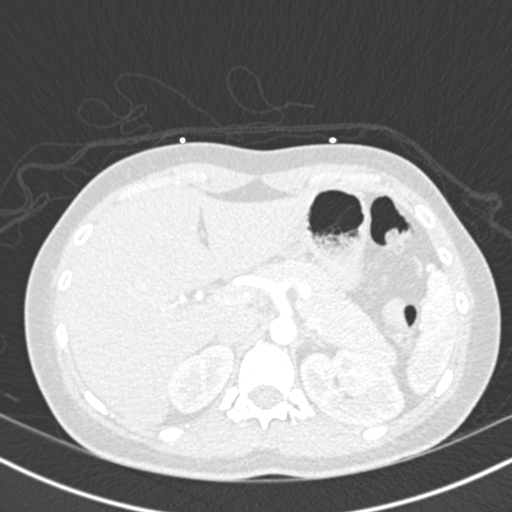
[im 77/325  soft-tissue]
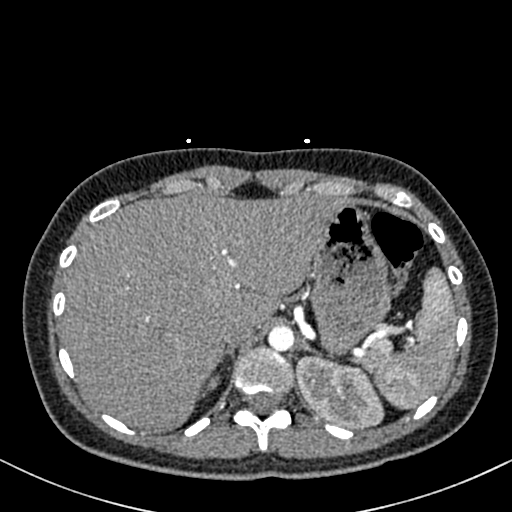
[im 96/325  lung]
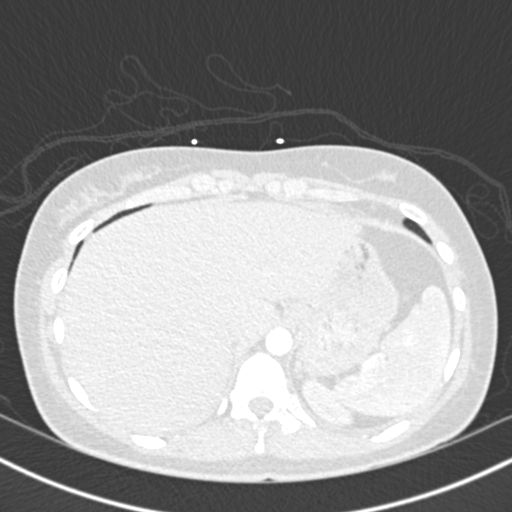
[im 115/325  soft-tissue]
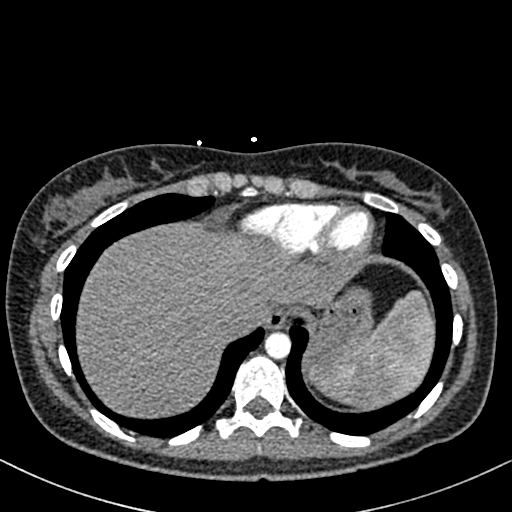
[im 134/325  lung]
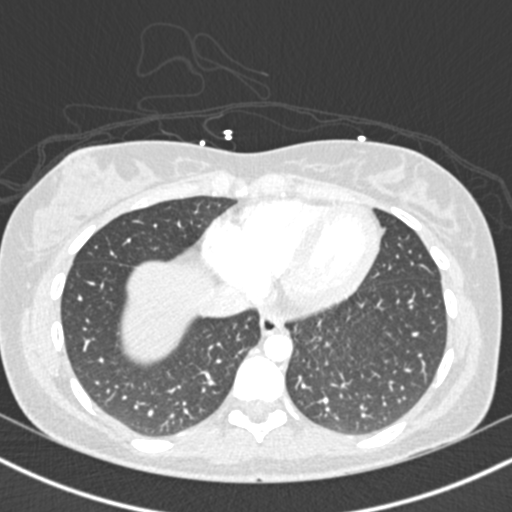
[im 153/325  soft-tissue]
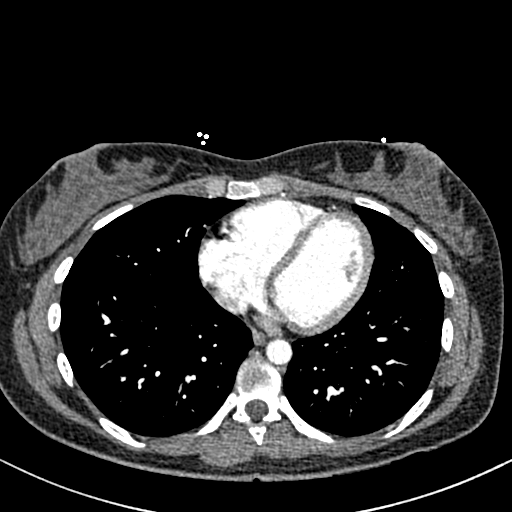
[im 172/325  lung]
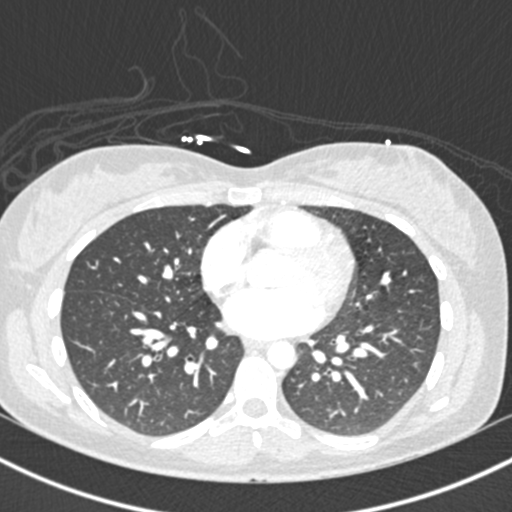
[im 191/325  soft-tissue]
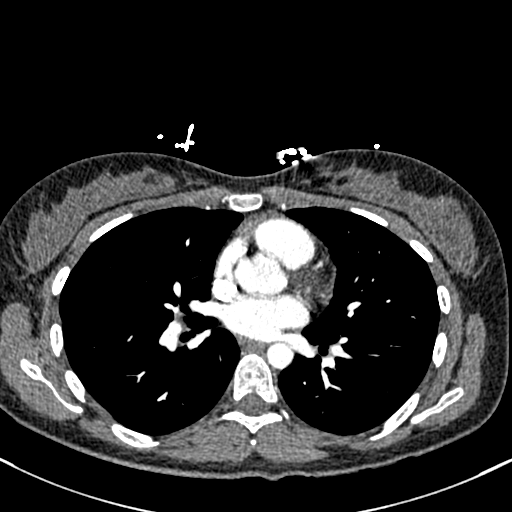
[im 210/325  lung]
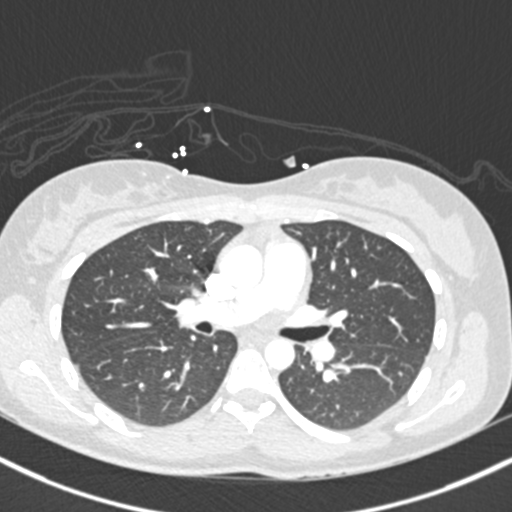
[im 229/325  soft-tissue]
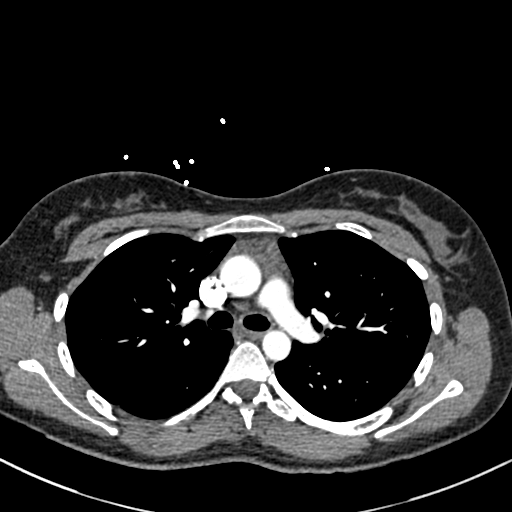
[im 248/325  lung]
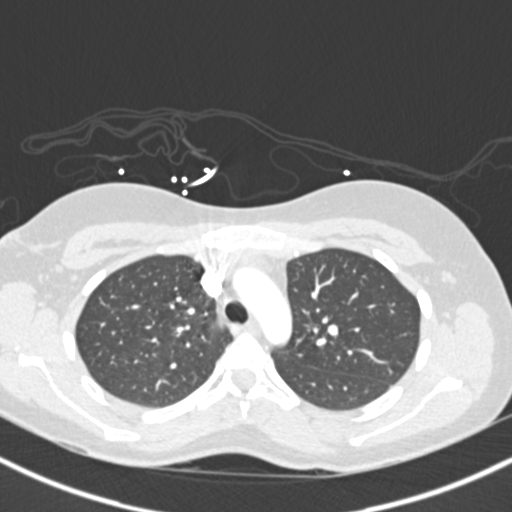
[im 267/325  soft-tissue]
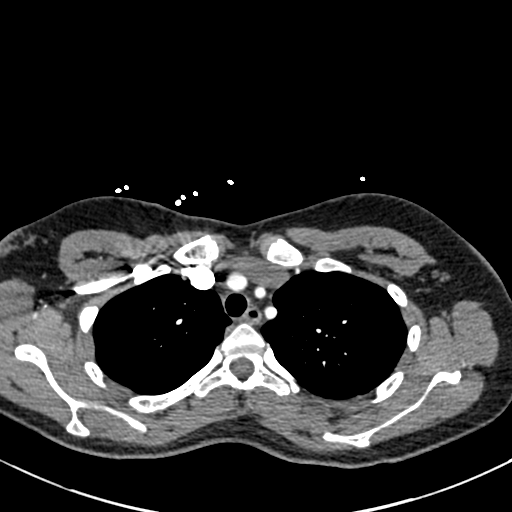
[im 286/325  lung]
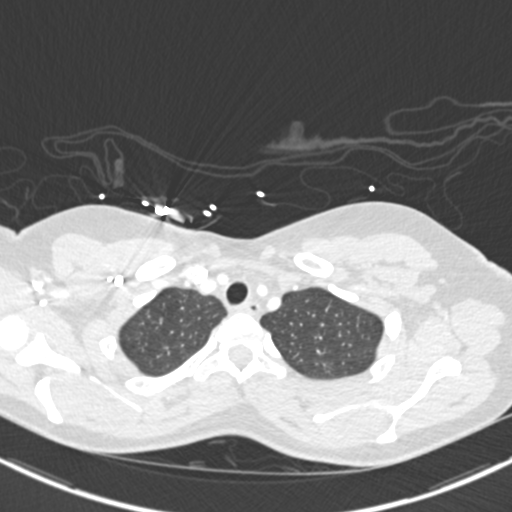
[im 305/325  soft-tissue]
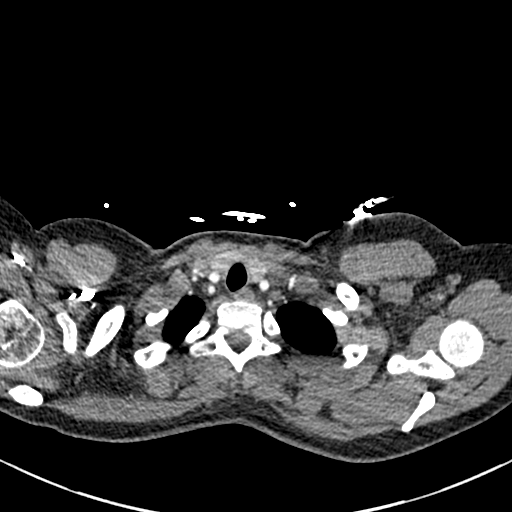

[Series 8: coronal mpr · coronal · 0.63mm/px · 3 of 150 slices shown]
[im 38/150  soft-tissue]
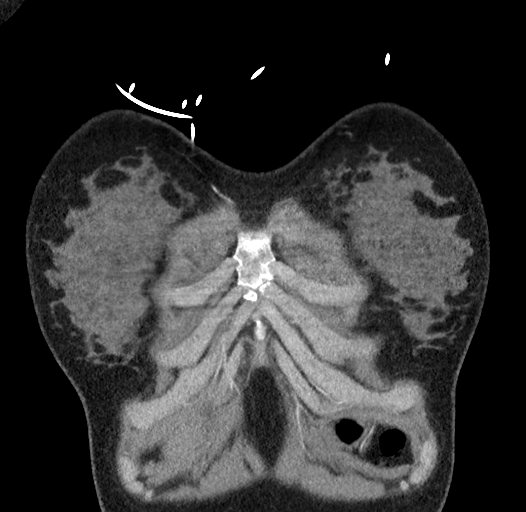
[im 75/150  soft-tissue]
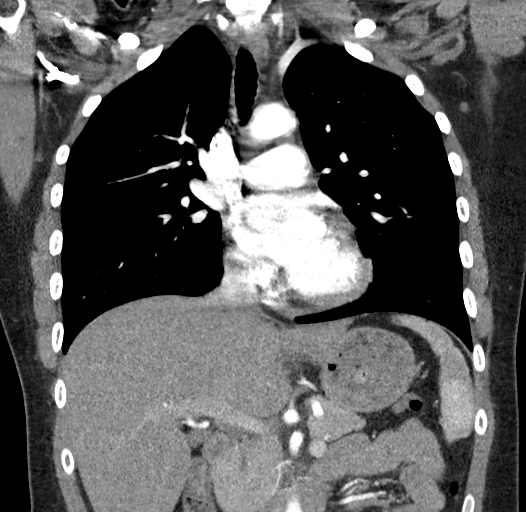
[im 112/150  soft-tissue]
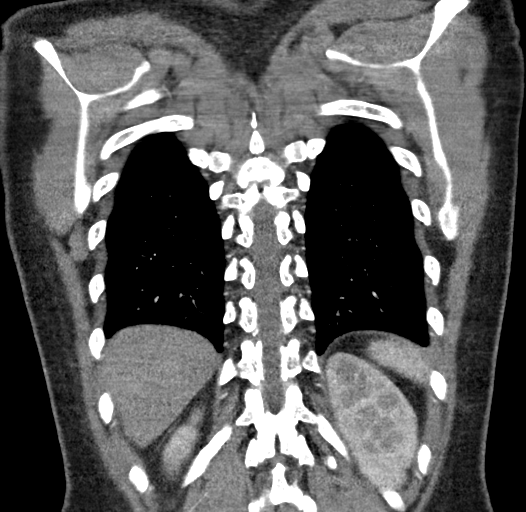

[19 of 46 positions shown; findings below may reference images not displayed]

FINDINGS: Cardiovascular: There is no demonstrable pulmonary embolus. There is
no appreciable thoracic aortic aneurysm or dissection. Visualized
great vessels appear unremarkable. There is no appreciable
pericardial effusion or pericardial thickening.

Mediastinum/Nodes: Visualized thyroid appears unremarkable. There is
no appreciable thoracic adenopathy. There is thymic tissue in the
anterior mediastinum, normal for age. No esophageal lesions are
appreciable.

Lungs/Pleura: There is no edema or consolidation. No pleural
effusion or pleural thickening evident. On axial slice 92 series 6,
there is a 3 mm nodular opacity in the lateral segment left lower
lobe. No similar nodular lesions noted elsewhere.

Upper Abdomen: Visualized upper abdominal regions appear normal.

Musculoskeletal: There are no blastic or lytic bone lesions. No
chest wall lesions evident.

Review of the MIP images confirms the above findings.
IMPRESSION: 1. No demonstrable pulmonary embolus. No thoracic aortic aneurysm or
dissection.

2. No edema or consolidation. 3 mm nodular lesion lateral segment
left lower lobe almost certainly benign.

3.  No evident thoracic adenopathy.

## 2020-01-17 ENCOUNTER — Encounter: Payer: Self-pay | Admitting: Family Medicine

## 2020-01-17 NOTE — Telephone Encounter (Signed)
Please advise, thanks.

## 2020-01-17 NOTE — Telephone Encounter (Signed)
Yes, ok to continue current med and f/u in 3 mo or so. Moshe Cipro can you help cancel upcoming appt for 01/20/20?-thx

## 2020-01-20 ENCOUNTER — Ambulatory Visit: Payer: 59 | Admitting: Family Medicine

## 2020-01-29 ENCOUNTER — Encounter: Payer: Self-pay | Admitting: Family Medicine

## 2020-01-30 ENCOUNTER — Other Ambulatory Visit: Payer: Self-pay

## 2020-01-30 ENCOUNTER — Ambulatory Visit (INDEPENDENT_AMBULATORY_CARE_PROVIDER_SITE_OTHER): Payer: 59 | Admitting: Family Medicine

## 2020-01-30 ENCOUNTER — Encounter: Payer: Self-pay | Admitting: Family Medicine

## 2020-01-30 VITALS — BP 117/80 | HR 83 | Temp 98.3°F | Resp 16 | Ht 64.0 in | Wt 164.0 lb

## 2020-01-30 DIAGNOSIS — L233 Allergic contact dermatitis due to drugs in contact with skin: Secondary | ICD-10-CM

## 2020-01-30 DIAGNOSIS — L700 Acne vulgaris: Secondary | ICD-10-CM

## 2020-01-30 MED ORDER — ERYTHROMYCIN 2 % EX GEL: Freq: Every day | CUTANEOUS | 6 refills | Status: DC

## 2020-01-30 NOTE — Telephone Encounter (Signed)
Suspect rxn to the acne med. Stop this med. No replacement med until this resolves. Unfortunately, no meds can make the reaction go away--it will resolve over the next week or so--hopefully sooner. Appt next week ok and we'll discuss alternative treatments for her acne.

## 2020-01-30 NOTE — Progress Notes (Signed)
OFFICE VISIT  01/30/2020   CC:  Chief Complaint  Patient presents with  . Allergic reaction   HPI:    Patient is a 24 y.o. Caucasian female who presents for skin rash on face.  Woke up 2 d/a with hot, red, itchy cheeks.  Located exclusively in areas where she has been applying her benzaclin gel.  She had been applying this med daily w/out any problems until then. She stopped the minocycline when she started her benzaclin about 2 mo ago. Took benadryl several times since rash started. No swelling of lips, eyes, tongue, or throat.  No SOB or wheeze.  No dizziness or GI sx's.  Past Medical History:  Diagnosis Date  . Acne vulgaris   . Anxiety and depression   . Chlamydia 07/21/2017   + at GYN screening age 57.  Test of cure done and was negative (Dr. Elonda Husky).  . Hay fever   . History of mononucleosis syndrome 2012  . Irregular menstrual cycle     History reviewed. No pertinent surgical history.  Outpatient Medications Prior to Visit  Medication Sig Dispense Refill  . ALPRAZolam (XANAX) 0.5 MG tablet 1-2 tabs po bid prn anxiety 30 tablet 0  . famotidine (PEPCID) 20 MG tablet Take 1 tablet (20 mg total) by mouth 2 (two) times daily. 30 tablet 0  . naproxen (NAPROSYN) 500 MG tablet Take 1 tablet (500 mg total) by mouth 2 (two) times daily with a meal. 30 tablet 0  . SPRINTEC 28 0.25-35 MG-MCG tablet Take 1 tablet by mouth once daily 84 tablet 3  . minocycline (MINOCIN) 100 MG capsule Take 1 capsule (100 mg total) by mouth 2 (two) times daily. 60 capsule 1  . clindamycin-benzoyl peroxide (BENZACLIN) gel Apply to affected areas of face (shoulders and back ok too) 1-2 times per day (Patient not taking: Reported on 01/30/2020) 50 g 3   No facility-administered medications prior to visit.    Allergies  Allergen Reactions  . Prednisone Anaphylaxis    Elevated BP, HR and BS  . Duloxetine Nausea Only    ROS As per HPI  PE: Blood pressure 117/80, pulse 83, temperature 98.3 F  (36.8 C), temperature source Temporal, resp. rate 16, height 5\' 4"  (1.626 m), weight 164 lb (74.4 kg), SpO2 97 %. Gen: Alert, well appearing.  Patient is oriented to person, place, time, and situation. AFFECT: pleasant, lucid thought and speech. Face: scattered small comedones w/out white or black heads. Mild erythema and warmth to cheeks, slightly blanchable. No neck LAD. No swelling of lips, eyes, or tongue.  LABS:    Chemistry      Component Value Date/Time   NA 138 03/25/2018 0359   K 4.5 03/25/2018 0359   CL 108 03/25/2018 0359   CO2 22 03/25/2018 0359   BUN 8 03/25/2018 0359   CREATININE 0.48 03/25/2018 0359      Component Value Date/Time   CALCIUM 8.6 (L) 03/25/2018 0359   ALKPHOS 39 03/24/2018 0412   AST 13 (L) 03/24/2018 0412   ALT 11 (L) 03/24/2018 0412   BILITOT 0.7 03/24/2018 0412      IMPRESSION AND PLAN:  1) Allergic contact dermatitis; from benzaclin gel. Will have her avoid application of anything to her face until face has returned to baseline for 24h.  2) Acne vulgaris: start trial of e-mycin gel qd AFTER face has returned to baseline for 24h. Consider bactrim ds bid in future if needed. May ultimately need to see derm MD.  An After Visit Summary was printed and given to the patient.  FOLLOW UP: Return for 6-8 wk f/u acne.  Signed:  Santiago Bumpers, MD           01/30/2020

## 2020-01-30 NOTE — Telephone Encounter (Signed)
Pt has been scheduled for this afternoon

## 2020-01-30 NOTE — Telephone Encounter (Signed)
Please advise, thanks.

## 2020-02-01 ENCOUNTER — Ambulatory Visit: Payer: 59 | Admitting: Family Medicine

## 2020-02-06 ENCOUNTER — Encounter: Payer: Self-pay | Admitting: Family Medicine

## 2020-02-06 NOTE — Telephone Encounter (Signed)
Please advise, thanks.

## 2020-03-30 ENCOUNTER — Ambulatory Visit: Payer: 59 | Admitting: Family Medicine

## 2020-04-06 ENCOUNTER — Ambulatory Visit (INDEPENDENT_AMBULATORY_CARE_PROVIDER_SITE_OTHER): Payer: 59 | Admitting: Family Medicine

## 2020-04-06 ENCOUNTER — Encounter: Payer: Self-pay | Admitting: Family Medicine

## 2020-04-06 ENCOUNTER — Other Ambulatory Visit: Payer: Self-pay

## 2020-04-06 VITALS — BP 118/86 | HR 111 | Temp 98.2°F | Resp 16 | Ht 64.0 in | Wt 159.6 lb

## 2020-04-06 DIAGNOSIS — Z79899 Other long term (current) drug therapy: Secondary | ICD-10-CM

## 2020-04-06 DIAGNOSIS — L700 Acne vulgaris: Secondary | ICD-10-CM

## 2020-04-06 MED ORDER — SPIRONOLACTONE 25 MG PO TABS
25.0000 mg | ORAL_TABLET | Freq: Every day | ORAL | 1 refills | Status: DC
Start: 2020-04-06 — End: 2020-07-24

## 2020-04-06 MED ORDER — TRETINOIN 0.1 % EX CREA: TOPICAL_CREAM | Freq: Every day | CUTANEOUS | 3 refills | Status: DC

## 2020-04-06 NOTE — Progress Notes (Signed)
See note by med student on same date. Signed:  Santiago Bumpers, MD           04/06/2020

## 2020-04-06 NOTE — Patient Instructions (Signed)
Monitor your blood pressure daily for 4-5 days.  If bp drops <100 on top or <50 on bottom then stop spironolactone.  Stop erythromycin ointment.

## 2020-04-06 NOTE — Progress Notes (Addendum)
CC:  Heidi Shields is a 24 yo female with a PMH of oligomenorrhea, acne, and anxiety/depression who presents today for concerns of acne.   HPI:   A/P of last visit: "1) Allergic contact dermatitis; from benzaclin gel. Will have her avoid application of anything to her face until face has returned to baseline for 24h.  2) Acne vulgaris: start trial of e-mycin gel qd AFTER face has returned to baseline for 24h. Consider bactrim ds bid in future if needed. May ultimately need to see derm MD."  acne vulgaris for the past year or so since the onset of mask wearing in the [place] era Currently taking erythrom gel and sprintec  Thinks the benzaclin gel worked better but had to switch off bc had allergic reaction - puffy welty red face  Been trying to wash face with Clearasil but can't really tell a difference but no rxn to this produce, which has benzoyl peroxide in it.  Feels prodromal soreness in areas before acne appears Wearing masks full time seems to have brought acne back    No headaches, mood changes, lightheadedness  Period every month, last 5-6 days, bad cramps, but not heavy bleeding (2-3 tampons/day)   Fine with using cream and/or pill to tx acne     PMH: Past Medical History:  Diagnosis Date  . Acne vulgaris   . Anxiety and depression   . Chlamydia 07/21/2017   + at GYN screening age 57.  Test of cure done and was negative (Dr. Elonda Shields).  . Hay fever   . History of mononucleosis syndrome 2012  . Irregular menstrual cycle     M/A: Current Outpatient Medications on File Prior to Visit  Medication Sig Dispense Refill  . ALPRAZolam (XANAX) 0.5 MG tablet 1-2 tabs po bid prn anxiety 30 tablet 0  . erythromycin with ethanol (EMGEL) 2 % gel Apply topically daily. 60 g 6  . famotidine (PEPCID) 20 MG tablet Take 1 tablet (20 mg total) by mouth 2 (two) times daily. 30 tablet 0  . naproxen (NAPROSYN) 500 MG tablet Take 1 tablet (500 mg total) by mouth 2 (two) times daily with a meal.  30 tablet 0  . SPRINTEC 28 0.25-35 MG-MCG tablet Take 1 tablet by mouth once daily 84 tablet 3   No current facility-administered medications on file prior to visit.  added phentermine 37.5mg  po qd    Allergies  Allergen Reactions  . Prednisone Anaphylaxis    Elevated BP, HR and BS  . Duloxetine Nausea Only    FH: Family History  Problem Relation Age of Onset  . Breast cancer Other        Aunt  . Diabetes Father   . High blood pressure Paternal Grandfather   . High blood pressure Paternal Grandmother   . Thyroid disease Sister     SH: Social History   Socioeconomic History  . Marital status: Single    Spouse name: Not on file  . Number of children: Not on file  . Years of education: Not on file  . Highest education level: Not on file  Occupational History  . Not on file  Tobacco Use  . Smoking status: Never Smoker  . Smokeless tobacco: Never Used  Substance and Sexual Activity  . Alcohol use: Yes    Comment: occ  . Drug use: No  . Sexual activity: Yes    Birth control/protection: Pill  Other Topics Concern  . 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   HS grad: [employer].   Lives with mom, dad, 2 sisters, and 1 brother in [place], [place].   Works full time as of 05/2017.   No T/A/Ds.   Social Determinants of Health   Financial Resource Strain:   . Difficulty of Paying Living Expenses:   Food Insecurity:   . Worried About Programme researcher, broadcasting/film/video in the Last Year:   . Barista in the Last Year:   Transportation Needs:   . Freight forwarder (Medical):   Marland Kitchen Lack of Transportation (Non-Medical):   Physical Activity:   . Days of Exercise per Week:   . Minutes of Exercise per Session:   Stress:   . Feeling of Stress :   Social Connections:   . Frequency of Communication with Friends and Family:   . Frequency of Social Gatherings with Friends and Family:   . Attends  Religious Services:   . Active Member of Clubs or Organizations:   . Attends Banker Meetings:   Marland Kitchen Marital Status:     ROS: ROS  PE: Vitals with BMI 01/30/2020 11/18/2019 10/17/2019  Height 5\' 4"  - 5\' 4"   Weight 164 lbs 161 lbs 156 lbs 13 oz  BMI 28.14 - 26.9  Systolic 117 122  Diastolic 80 80 74  Pulse 83 79 84    Physical Exam Face with diffusely scattered pinkish papular/comedonal lesions on lateral cheeks and around mouth/chin. NO nodules.  Labs: No results found for this or any previous visit (from the past 2160 hour(s)).   A/P: In summary, Heidi Shields is a 24 year old woman with a past medical history of oligomenorrhea, acne, and anxiety/depression who presents with uncontrolled acne vulgaris for the past year or so since the onset of mask wearing in the COVID era. On physical exam, she has pustular and erythematous papular lesions on her cheeks and neck in a mask distribution. We reviewed age and gender appropriate health maintenance issues (prudent diet, regular exercise, health risks of tobacco and excessive alcohol, use of seatbelts, fire alarms in home, use of sunscreen). My plan is   Acne: add spironolactone 25mg  po qd and topical 0.1% isotretinoin cream; check BP daily for the next week; stop erythromycin gel ; f/u in month to check patient tolerance of drugs/efficacy  Would like to titrate spironolactone up to at least the 50mg  qd dose if not the 100 mg dose unless great effect at lower dosing.  Therapeutic expectations and side effect profile of medication discussed today.  Patient's questions answered.  Depression/anxiety: continue meds as indicated Oligomenorrhea: continue sprintec as indicated Vaccines: Tdap needed!  Labs: check BMET (for potassium), come back in 1 week to recheck potassium  Cervical cancer screening: cytology and HPV testing every 3 -5 years from 24 yo -> due in 2022    F/u:  -in 1 week for labs to recheck potassium -in 1 month to  see how Heidi Shields is tolerating meds and see if acne better   Signed: , MS3 06 April 2020    ADDENDUM I personally was present during the history, physical exam, and medical decision-making activities of this service and have verified that the service and findings are accurately documented in the student's note. Signed:  2023, MD           04/06/2020

## 2020-04-07 LAB — BASIC METABOLIC PANEL
BUN: 7 mg/dL (ref 7–25)
CO2: 26 mmol/L (ref 20–32)
Calcium: 9.9 mg/dL (ref 8.6–10.2)
Chloride: 103 mmol/L (ref 98–110)
Creat: 0.6 mg/dL (ref 0.50–1.10)
Glucose, Bld: 94 mg/dL (ref 65–99)
Potassium: 4.3 mmol/L (ref 3.5–5.3)
Sodium: 137 mmol/L (ref 135–146)

## 2020-04-09 ENCOUNTER — Encounter: Payer: Self-pay | Admitting: Family Medicine

## 2020-04-13 ENCOUNTER — Other Ambulatory Visit: Payer: Self-pay

## 2020-04-13 ENCOUNTER — Ambulatory Visit (INDEPENDENT_AMBULATORY_CARE_PROVIDER_SITE_OTHER): Payer: 59 | Admitting: Family Medicine

## 2020-04-13 DIAGNOSIS — L700 Acne vulgaris: Secondary | ICD-10-CM | POA: Diagnosis not present

## 2020-04-13 DIAGNOSIS — Z79899 Other long term (current) drug therapy: Secondary | ICD-10-CM

## 2020-04-13 NOTE — Addendum Note (Signed)
Addended by: Eulah Pont on: 04/13/2020 09:48 AM   Modules accepted: Orders

## 2020-04-14 LAB — BASIC METABOLIC PANEL
BUN/Creatinine Ratio: 15 (ref 9–23)
BUN: 11 mg/dL (ref 6–20)
CO2: 23 mmol/L (ref 20–29)
Calcium: 10.3 mg/dL — ABNORMAL HIGH (ref 8.7–10.2)
Chloride: 103 mmol/L (ref 96–106)
Creatinine, Ser: 0.71 mg/dL (ref 0.57–1.00)
GFR calc Af Amer: 138 mL/min/{1.73_m2} (ref >59)
GFR calc non Af Amer: 120 mL/min/{1.73_m2} (ref >59)
Glucose: 81 mg/dL (ref 65–99)
Potassium: 4.8 mmol/L (ref 3.5–5.2)
Sodium: 140 mmol/L (ref 134–144)

## 2020-04-20 ENCOUNTER — Ambulatory Visit: Payer: 59 | Admitting: Family Medicine

## 2020-04-25 ENCOUNTER — Encounter: Payer: Self-pay | Admitting: Family Medicine

## 2020-04-26 ENCOUNTER — Other Ambulatory Visit: Payer: Self-pay

## 2020-04-26 MED ORDER — SULFAMETHOXAZOLE-TRIMETHOPRIM 800-160 MG PO TABS: 1.0000 | ORAL_TABLET | Freq: Two times a day (BID) | ORAL | 0 refills | Status: DC

## 2020-04-26 NOTE — Telephone Encounter (Signed)
Pt was last seen 04/06/20 and started on 0.1 retin-a cream to use. She is currently scheduled for next f/u appt on 5/28. Please advise, thanks.

## 2020-04-26 NOTE — Progress Notes (Signed)
bac

## 2020-04-26 NOTE — Telephone Encounter (Signed)
The spironolactone has likely had time to at least begin to work but maybe not full benefit yet. Any recent excessive pressure on face in the areas she has the new lesions (facemask, resting face on hand a lot or pressing phone against the areas)? I want her to continue all current treatments and I want to add bactrim antibiotic to use every day. No tanning bed use, try to limit exposure to sun (no "sunbathing"). Pls eRx bactrim DS tab, 1 bid, #60, no RF. If not improving significantly in 2-4 wks then we'll refer to dermatology for consideration of accutane.  Keep f/u appt already set with me.-thx

## 2020-05-11 ENCOUNTER — Other Ambulatory Visit: Payer: Self-pay

## 2020-05-11 ENCOUNTER — Telehealth (INDEPENDENT_AMBULATORY_CARE_PROVIDER_SITE_OTHER): Payer: 59 | Admitting: Family Medicine

## 2020-05-11 ENCOUNTER — Encounter: Payer: Self-pay | Admitting: Family Medicine

## 2020-05-11 VITALS — Ht 64.0 in | Wt 153.0 lb

## 2020-05-11 DIAGNOSIS — L700 Acne vulgaris: Secondary | ICD-10-CM | POA: Diagnosis not present

## 2020-05-11 NOTE — Progress Notes (Signed)
Virtual Visit via Video Note  I connected with pt on 05/11/20 at  8:00 AM EDT by a video enabled telemedicine application and verified that I am speaking with the correct person using two identifiers.  Location patient: home Location provider:work or home office Persons participating in the virtual visit: patient, provider  I discussed the limitations of evaluation and management by telemedicine and the availability of in person appointments. The patient expressed understanding and agreed to proceed.  Telemedicine visit is a necessity given the COVID-19 restrictions in place at the current time.  HPI: 24 y/o WF being seen today for 1 mo f/u acne. Started aldactone 25mg  qd, isotretinoin cream, and d/c'd e-mycin gel last visit. In the interim (04/25/20) she had a few days of getting signif worse acne so I added bactrim.  CURRENTLY: Feels like things are improved. Can only tolerate isotretinoin 2 days a week b/c drying. Taking bactrim and spirono w/out problem. Using special water to rinse face lately-->Micellar cleansing water--[employer].   ROS: See pertinent positives and negatives per HPI.  Past Medical History:  Diagnosis Date  . Acne vulgaris   . Anxiety and depression   . Chlamydia 07/21/2017   + at GYN screening age 27.  Test of cure done and was negative (Dr. Elonda Husky).  . Hay fever   . History of mononucleosis syndrome 2012  . Irregular menstrual cycle     No past surgical history on file.     Current Outpatient Medications:  .  ALPRAZolam (XANAX) 0.5 MG tablet, 1-2 tabs po bid prn anxiety, Disp: 30 tablet, Rfl: 0 .  famotidine (PEPCID) 20 MG tablet, Take 1 tablet (20 mg total) by mouth 2 (two) times daily., Disp: 30 tablet, Rfl: 0 .  naproxen (NAPROSYN) 500 MG tablet, Take 1 tablet (500 mg total) by mouth 2 (two) times daily with a meal., Disp: 30 tablet, Rfl: 0 .  spironolactone (ALDACTONE) 25 MG tablet, Take 1 tablet (25 mg total) by mouth daily., Disp: 30 tablet, Rfl:  1 .  SPRINTEC 28 0.25-35 MG-MCG tablet, Take 1 tablet by mouth once daily, Disp: 84 tablet, Rfl: 3 .  sulfamethoxazole-trimethoprim (BACTRIM DS) 800-160 MG tablet, Take 1 tablet by mouth 2 (two) times daily., Disp: 60 tablet, Rfl: 0 .  tretinoin (RETIN-A) 0.1 % cream, Apply topically at bedtime., Disp: 45 g, Rfl: 3 .  erythromycin with ethanol (EMGEL) 2 % gel, Apply topically daily. (Patient not taking: Reported on 05/11/2020), Disp: 60 g, Rfl: 6  EXAM:  VITALS per patient if applicable: Ht 5\' 4"  (1.626 m)   Wt 153 lb (69.4 kg)   LMP 05/07/2020 (Exact Date)   BMI 26.26 kg/m    GENERAL: alert, oriented, appears well and in no acute distress  HEENT: atraumatic, conjunttiva clear, no obvious abnormalities on inspection of external nose and ears Face with a few pinkish brown papules w/out white or black head. No inflammation or nodule.  Primarily cheeks affected.  NECK: normal movements of the head and neck  LUNGS: on inspection no signs of respiratory distress, breathing rate appears normal, no obvious gross SOB, gasping or wheezing  CV: no obvious cyanosis  MS: moves all visible extremities without noticeable abnormality  PSYCH/NEURO: pleasant and cooperative, no obvious depression or anxiety, speech and thought processing grossly intact  LABS: none today    Chemistry      Component Value Date/Time   NA 140 04/13/2020 0948   K 4.8 04/13/2020 0948   CL 103 04/13/2020 0948  CO2 23 04/13/2020 0948   BUN 11 04/13/2020 0948   CREATININE 0.71 04/13/2020 0948   CREATININE 0.60 04/06/2020 1612      Component Value Date/Time   CALCIUM 10.3 (H) 04/13/2020 0948   ALKPHOS 39 03/24/2018 0412   AST 13 (L) 03/24/2018 0412   ALT 11 (L) 03/24/2018 0412   BILITOT 0.7 03/24/2018 0412     ASSESSMENT AND PLAN:  Discussed the following assessment and plan:  Acne vulgaris: improving the last couple weeks since addition of bactrim + her use of micellin face cleanser. No changes  today. Discussed appropriate sun exposure limitation + use of SPF 50.  If still improving at f/u in 1 mo then we'll space follow up out to q109mo unless problems arise.  -we discussed possible serious and likely etiologies, options for evaluation and workup, limitations of telemedicine visit vs in person visit, treatment, treatment risks and precautions. Pt prefers to treat via telemedicine empirically rather then risking or undertaking an in person visit at this moment. Patient agrees to seek prompt in person care if worsening, new symptoms arise, or if is not improving with treatment.   I discussed the assessment and treatment plan with the patient. The patient was provided an opportunity to ask questions and all were answered. The patient agreed with the plan and demonstrated an understanding of the instructions.   The patient was advised to call back or seek an in-person evaluation if the symptoms worsen or if the condition fails to improve as anticipated.  F/u: 1 mo  Signed:  Santiago Bumpers, MD           05/11/2020

## 2020-05-17 ENCOUNTER — Other Ambulatory Visit: Payer: Self-pay | Admitting: Family Medicine

## 2020-05-17 ENCOUNTER — Encounter: Payer: Self-pay | Admitting: Family Medicine

## 2020-05-17 DIAGNOSIS — L700 Acne vulgaris: Secondary | ICD-10-CM

## 2020-05-17 NOTE — Telephone Encounter (Signed)
Patient was last seen 05/11/20, given bactrim ds on 5/13 #60 bid. Please advise if referral appropriate?

## 2020-05-23 ENCOUNTER — Encounter: Payer: Self-pay | Admitting: Family Medicine

## 2020-05-23 ENCOUNTER — Other Ambulatory Visit: Payer: Self-pay

## 2020-05-23 DIAGNOSIS — L700 Acne vulgaris: Secondary | ICD-10-CM

## 2020-06-01 DIAGNOSIS — L700 Acne vulgaris: Secondary | ICD-10-CM | POA: Diagnosis not present

## 2020-06-08 ENCOUNTER — Ambulatory Visit: Payer: 59 | Admitting: Family Medicine

## 2020-07-05 DIAGNOSIS — Z79899 Other long term (current) drug therapy: Secondary | ICD-10-CM | POA: Diagnosis not present

## 2020-07-05 DIAGNOSIS — L709 Acne, unspecified: Secondary | ICD-10-CM | POA: Diagnosis not present

## 2020-07-06 DIAGNOSIS — B070 Plantar wart: Secondary | ICD-10-CM | POA: Diagnosis not present

## 2020-07-06 DIAGNOSIS — L700 Acne vulgaris: Secondary | ICD-10-CM | POA: Diagnosis not present

## 2020-07-24 ENCOUNTER — Other Ambulatory Visit (HOSPITAL_COMMUNITY)
Admission: RE | Admit: 2020-07-24 | Discharge: 2020-07-24 | Disposition: A | Discharge status: Home / Self Care | Payer: 59 | Source: Ambulatory Visit | Attending: Obstetrics & Gynecology | Admitting: Obstetrics & Gynecology

## 2020-07-24 ENCOUNTER — Ambulatory Visit (INDEPENDENT_AMBULATORY_CARE_PROVIDER_SITE_OTHER): Payer: 59 | Admitting: Obstetrics & Gynecology

## 2020-07-24 ENCOUNTER — Encounter: Payer: Self-pay | Admitting: Obstetrics & Gynecology

## 2020-07-24 VITALS — BP 131/87 | HR 112 | Ht 63.0 in | Wt 156.0 lb

## 2020-07-24 DIAGNOSIS — Z01419 Encounter for gynecological examination (general) (routine) without abnormal findings: Secondary | ICD-10-CM | POA: Diagnosis not present

## 2020-07-24 MED ORDER — METRONIDAZOLE 0.75 % VA GEL: VAGINAL | 0 refills | Status: DC

## 2020-07-24 MED ORDER — DESOGESTREL-ETHINYL ESTRADIOL 0.15-30 MG-MCG PO TABS: 1.0000 | ORAL_TABLET | Freq: Every day | ORAL | 3 refills | Status: DC

## 2020-07-24 NOTE — Progress Notes (Signed)
Subjective:     Heidi Shields is a 24 y.o. female here for a routine exam.  Patient's last menstrual period was 06/25/2020 (exact date). [id number] Birth Control Method:  OCP Menstrual Calendar(currently): regular  Current complaints: none.   Current acute medical issues:  Cystic acne   Recent Gynecologic History Patient's last menstrual period was 06/25/2020 (exact date). Last Pap: 2019,  normal Last mammogram: n/a,    Past Medical History:  Diagnosis Date  . Acne vulgaris   . Anxiety and depression   . Chlamydia 07/21/2017   + at GYN screening age 61.  Test of cure done and was negative (Dr. Despina Hidden).  . Hay fever   . History of mononucleosis syndrome 2012  . Irregular menstrual cycle     History reviewed. No pertinent surgical history.  OB History    Gravida  0   Para  0   Term  0   Preterm  0   AB  0   Living  0     SAB  0   TAB  0   Ectopic  0   Multiple  0   Live Births  0           Social History   Socioeconomic History  . Marital status: Single    Spouse name: Not on file  . Number of children: Not on file  . Years of education: Not on file  . Highest education level: Not on file  Occupational History  . Not on file  Tobacco Use  . Smoking status: Never Smoker  . Smokeless tobacco: Never Used  Vaping Use  . Vaping Use: Never used  Substance and Sexual Activity  . Alcohol use: Yes    Comment: occ  . Drug use: No  . Sexual activity: Yes    Birth control/protection: Pill  Other Topics Concern  . 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   HS grad: [employer].   Lives with mom, dad, 2 sisters, and 1 brother in [place], [place].   Works full time as of 05/2017.   No T/A/Ds.   Social Determinants of Health   Financial Resource Strain: Low Risk   . Difficulty of Paying Living Expenses: Not hard at all  Food Insecurity: No Food Insecurity  . Worried About  Programme researcher, broadcasting/film/video in the Last Year: Never true  . Ran Out of Food in the Last Year: Never true  Transportation Needs: No Transportation Needs  . Lack of Transportation (Medical): No  . Lack of Transportation (Non-Medical): No  Physical Activity: Insufficiently Active  . Days of Exercise per Week: 2 days  . Minutes of Exercise per Session: 30 min  Stress: No Stress Concern Present  . Feeling of Stress : Not at all  Social Connections: Moderately Integrated  . Frequency of Communication with Friends and Family: More than three times a week  . Frequency of Social Gatherings with Friends and Family: More than three times a week  . Attends Religious Services: More than 4 times per year  . Active Member of Clubs or Organizations: No  . Attends Banker Meetings: Never  . Marital Status: Living with partner    Family History  Problem Relation Age of Onset  . Breast cancer Other        Aunt  . Diabetes Father   . High blood pressure Paternal  Grandfather   . High blood pressure Paternal Grandmother   . Thyroid disease Sister      Current Outpatient Medications:  .  AMNESTEEM 20 MG capsule, Take 20 mg by mouth daily., Disp: , Rfl:  .  desogestrel-ethinyl estradiol (APRI) 0.15-30 MG-MCG tablet, Take 1 tablet by mouth daily., Disp: 84 tablet, Rfl: 3 .  metroNIDAZOLE (METROGEL VAGINAL) 0.75 % vaginal gel, Nightly x 5 nights, Disp: 70 g, Rfl: 0  Review of Systems  Review of Systems  Constitutional: Negative for fever, chills, weight loss, malaise/fatigue and diaphoresis.  HENT: Negative for hearing loss, ear pain, nosebleeds, congestion, sore throat, neck pain, tinnitus and ear discharge.   Eyes: Negative for blurred vision, double vision, photophobia, pain, discharge and redness.  Respiratory: Negative for cough, hemoptysis, sputum production, shortness of breath, wheezing and stridor.   Cardiovascular: Negative for chest pain, palpitations, orthopnea, claudication, leg  swelling and PND.  Gastrointestinal: negative for abdominal pain. Negative for heartburn, nausea, vomiting, diarrhea, constipation, blood in stool and melena.  Genitourinary: Negative for dysuria, urgency, frequency, hematuria and flank pain.  Musculoskeletal: Negative for myalgias, back pain, joint pain and falls.  Skin: Negative for itching and rash.  Neurological: Negative for dizziness, tingling, tremors, sensory change, speech change, focal weakness, seizures, loss of consciousness, weakness and headaches.  Endo/Heme/Allergies: Negative for environmental allergies and polydipsia. Does not bruise/bleed easily.  Psychiatric/Behavioral: Negative for depression, suicidal ideas, hallucinations, memory loss and substance abuse. The patient is not nervous/anxious and does not have insomnia.        Objective:  Blood pressure 131/87, pulse (!) 112, height 5\' 3"  (1.6 m), weight 156 lb (70.8 kg), last menstrual period 06/25/2020.   Physical Exam  Vitals reviewed. Constitutional: She is oriented to person, place, and time. She appears well-developed and well-nourished.  HENT:  Head: Normocephalic and atraumatic.        Right Ear: External ear normal.  Left Ear: External ear normal.  Nose: Nose normal.  Mouth/Throat: Oropharynx is clear and moist.  Eyes: Conjunctivae and EOM are normal. Pupils are equal, round, and reactive to light. Right eye exhibits no discharge. Left eye exhibits no discharge. No scleral icterus.  Neck: Normal range of motion. Neck supple. No tracheal deviation present. No thyromegaly present.  Cardiovascular: Normal rate, regular rhythm, normal heart sounds and intact distal pulses.  Exam reveals no gallop and no friction rub.   No murmur heard. Respiratory: Effort normal and breath sounds normal. No respiratory distress. She has no wheezes. She has no rales. She exhibits no tenderness.  GI: Soft. Bowel sounds are normal. She exhibits no distension and no mass. There is no  tenderness. There is no rebound and no guarding.  Genitourinary:  Breasts no masses skin changes or nipple changes bilaterally      Vulva is normal without lesions Vagina is pink moist without discharge Cervix normal in appearance and pap is done Uterus is normal size shape and contour Adnexa is negative with normal sized ovaries   Musculoskeletal: Normal range of motion. She exhibits no edema and no tenderness.  Neurological: She is alert and oriented to person, place, and time. She has normal reflexes. She displays normal reflexes. No cranial nerve deficit. She exhibits normal muscle tone. Coordination normal.  Skin: Skin is warm and dry. No rash noted. No erythema. No pallor.  Psychiatric: She has a normal mood and affect. Her behavior is normal. Judgment and thought content normal.       Medications Ordered at today's  visit: Meds ordered this encounter  Medications  . desogestrel-ethinyl estradiol (APRI) 0.15-30 MG-MCG tablet    Sig: Take 1 tablet by mouth daily.    Dispense:  84 tablet    Refill:  3  . metroNIDAZOLE (METROGEL VAGINAL) 0.75 % vaginal gel    Sig: Nightly x 5 nights    Dispense:  70 g    Refill:  0    Other orders placed at today's visit: No orders of the defined types were placed in this encounter.     Assessment:    Normal Gyn exam.   Feels better on her off week of pills, feels happier Unpleasant vaginal discharge odor Plan:    Contraception: OCP (estrogen/progesterone). Follow up in: 2 years. metro gel to rebalance vaginal microbiome     Return in about 2 years (around 07/24/2022) for yearly, with Dr Despina Hidden.

## 2020-07-27 LAB — CYTOLOGY - PAP
Chlamydia: NEGATIVE
Comment: NEGATIVE
Comment: NEGATIVE
Comment: NORMAL
Diagnosis: NEGATIVE
Diagnosis: REACTIVE
High risk HPV: NEGATIVE
Neisseria Gonorrhea: NEGATIVE

## 2020-08-10 DIAGNOSIS — L700 Acne vulgaris: Secondary | ICD-10-CM | POA: Diagnosis not present

## 2020-08-10 DIAGNOSIS — K130 Diseases of lips: Secondary | ICD-10-CM | POA: Diagnosis not present

## 2020-09-14 DIAGNOSIS — L700 Acne vulgaris: Secondary | ICD-10-CM | POA: Diagnosis not present

## 2020-09-14 DIAGNOSIS — Z79899 Other long term (current) drug therapy: Secondary | ICD-10-CM | POA: Diagnosis not present

## 2020-09-20 ENCOUNTER — Other Ambulatory Visit: Payer: Self-pay | Admitting: *Deleted

## 2020-09-20 ENCOUNTER — Other Ambulatory Visit: Payer: 59

## 2020-09-20 DIAGNOSIS — Z20822 Contact with and (suspected) exposure to covid-19: Secondary | ICD-10-CM

## 2020-09-21 LAB — NOVEL CORONAVIRUS, NAA: SARS-CoV-2, NAA: NOT DETECTED

## 2020-09-21 LAB — SPECIMEN STATUS REPORT

## 2020-09-21 LAB — SARS-COV-2, NAA 2 DAY TAT

## 2020-10-19 DIAGNOSIS — L853 Xerosis cutis: Secondary | ICD-10-CM | POA: Diagnosis not present

## 2020-10-19 DIAGNOSIS — Z79899 Other long term (current) drug therapy: Secondary | ICD-10-CM | POA: Diagnosis not present

## 2020-10-19 DIAGNOSIS — L700 Acne vulgaris: Secondary | ICD-10-CM | POA: Diagnosis not present

## 2020-11-19 DIAGNOSIS — Z79899 Other long term (current) drug therapy: Secondary | ICD-10-CM | POA: Diagnosis not present

## 2020-11-19 DIAGNOSIS — L700 Acne vulgaris: Secondary | ICD-10-CM | POA: Diagnosis not present

## 2020-11-22 DIAGNOSIS — Z79899 Other long term (current) drug therapy: Secondary | ICD-10-CM | POA: Diagnosis not present

## 2020-11-22 DIAGNOSIS — L700 Acne vulgaris: Secondary | ICD-10-CM | POA: Diagnosis not present

## 2020-11-22 DIAGNOSIS — L853 Xerosis cutis: Secondary | ICD-10-CM | POA: Diagnosis not present

## 2020-12-10 ENCOUNTER — Encounter: Payer: Self-pay | Admitting: Family Medicine

## 2020-12-10 ENCOUNTER — Telehealth (INDEPENDENT_AMBULATORY_CARE_PROVIDER_SITE_OTHER): Payer: 59 | Admitting: Family Medicine

## 2020-12-10 ENCOUNTER — Other Ambulatory Visit: Payer: Self-pay

## 2020-12-10 VITALS — Temp 98.5°F

## 2020-12-10 DIAGNOSIS — J209 Acute bronchitis, unspecified: Secondary | ICD-10-CM

## 2020-12-10 MED ORDER — ALBUTEROL SULFATE HFA 108 (90 BASE) MCG/ACT IN AERS: INHALATION_SPRAY | RESPIRATORY_TRACT | 0 refills | Status: DC

## 2020-12-10 NOTE — Progress Notes (Signed)
Virtual Visit via Video Note  I connected with pt on 12/10/20 at  4:00 PM EST by a video enabled telemedicine application and verified that I am speaking with the correct person using two identifiers.  Location patient: home, [place] Location provider:work or home office Persons participating in the virtual visit: patient, provider  I discussed the limitations of evaluation and management by telemedicine and the availability of in person appointments. The patient expressed understanding and agreed to proceed.  Telemedicine visit is a necessity given the COVID-19 restrictions in place at the current time.  HPI: 24 y/o WF being seen for cough. Onset 4-5 d/a, scratchy throat and getting hoarse, then with some coughing. No nasal cong/runny nose.  Night-time cough is much worse--long spells of coughing, has some ST.  Some chest tightness and wheezing in the midst of coughing spells. No fevers.  No SOB. No n/v/d or rash.  No body aches.    Covid 19 vaccine status: UTD. Flu vaccine status: UTD.  ROS: See pertinent positives and negatives per HPI.  Past Medical History:  Diagnosis Date  . Acne vulgaris   . Anxiety and depression   . Chlamydia 07/21/2017   + at GYN screening age 39.  Test of cure done and was negative (Dr. Despina Hidden).  . Hay fever   . History of mononucleosis syndrome 2012  . Irregular menstrual cycle     No past surgical history on file.   Current Outpatient Medications:  .  AMNESTEEM 20 MG capsule, Take 20 mg by mouth daily., Disp: , Rfl:  .  CLARAVIS 40 MG capsule, Take 40 mg by mouth 2 (two) times daily., Disp: , Rfl:  .  desogestrel-ethinyl estradiol (APRI) 0.15-30 MG-MCG tablet, Take 1 tablet by mouth daily., Disp: 84 tablet, Rfl: 3 .  metroNIDAZOLE (METROGEL VAGINAL) 0.75 % vaginal gel, Nightly x 5 nights, Disp: 70 g, Rfl: 0 .  triamcinolone (KENALOG) 0.1 %, Apply topically., Disp: , Rfl:   EXAM:  VITALS per patient if applicable:  Vitals with BMI 07/24/2020  05/11/2020 04/06/2020  Height 5\' 3"  5\' 4"  5\' 4"   Weight 156 lbs 153 lbs 159 lbs 10 oz  BMI 27.64 26.25 27.38  Systolic 131 - 118  Diastolic 87 - 86  Pulse 112 -     GENERAL: alert, oriented, appears well and in no acute distress  HEENT: atraumatic, conjunttiva clear, no obvious abnormalities on inspection of external nose and ears  NECK: normal movements of the head and neck  LUNGS: on inspection no signs of respiratory distress, breathing rate appears normal, no obvious gross SOB, gasping or wheezing  CV: no obvious cyanosis  MS: moves all visible extremities without noticeable abnormality  PSYCH/NEURO: pleasant and cooperative, no obvious depression or anxiety, speech and thought processing grossly intact  LABS: none today.  ASSESSMENT AND PLAN:  Discussed the following assessment and plan:  Laryngitis/bronchitis, suspect viral. Albuterol hfa 1-2p q4h prn ->eRx'd today. OTC delsym for cough. Appropriate contact precautions/social distancing discussed.  I discussed the assessment and treatment plan with the patient. The patient was provided an opportunity to ask questions and all were answered. The patient agreed with the plan and demonstrated an understanding of the instructions.   F/u: as needed  Signed:  , MD           12/10/2020

## 2020-12-12 ENCOUNTER — Encounter: Payer: Self-pay | Admitting: Family Medicine

## 2020-12-12 MED ORDER — BENZONATATE 200 MG PO CAPS: ORAL_CAPSULE | ORAL | 0 refills | Status: DC

## 2020-12-12 NOTE — Telephone Encounter (Signed)
OK, tessalon pearls eRx'd.

## 2020-12-25 ENCOUNTER — Encounter: Payer: Self-pay | Admitting: Family Medicine

## 2020-12-25 NOTE — Telephone Encounter (Signed)
Name has been updated. License is attached to pt message.

## 2020-12-27 DIAGNOSIS — L700 Acne vulgaris: Secondary | ICD-10-CM | POA: Diagnosis not present

## 2020-12-27 DIAGNOSIS — Z79899 Other long term (current) drug therapy: Secondary | ICD-10-CM | POA: Diagnosis not present

## 2021-01-30 ENCOUNTER — Telehealth: Payer: 59 | Admitting: Physician Assistant

## 2021-01-30 DIAGNOSIS — J028 Acute pharyngitis due to other specified organisms: Secondary | ICD-10-CM

## 2021-01-30 DIAGNOSIS — B9789 Other viral agents as the cause of diseases classified elsewhere: Secondary | ICD-10-CM

## 2021-01-30 NOTE — Progress Notes (Signed)
We are sorry that you are not feeling well.  Here is how we plan to help!  Your symptoms indicate a likely viral infection (Pharyngitis).   Pharyngitis is inflammation in the back of the throat which can cause a sore throat, scratchiness and sometimes difficulty swallowing.   Pharyngitis is typically caused by a respiratory virus and will just run its course.  Please keep in mind that your symptoms could last up to 10 days.  For throat pain, we recommend over the counter oral pain relief medications such as acetaminophen or aspirin, or anti-inflammatory medications such as ibuprofen or naproxen sodium.  Topical treatments such as oral throat lozenges or sprays may be used as needed.  Avoid close contact with loved ones, especially the very young and elderly.  Remember to wash your hands thoroughly throughout the day as this is the number one way to prevent the spread of infection and wipe down door knobs and counters with disinfectant.  After careful review of your answers, I would not recommend and antibiotic for your condition.  Antibiotics should not be used to treat conditions that we suspect are caused by viruses like the virus that causes the common cold or flu. However, some people can have Strep with atypical symptoms. You may need formal testing in clinic or office to confirm if your symptoms continue or worsen.  Providers prescribe antibiotics to treat infections caused by bacteria. Antibiotics are very powerful in treating bacterial infections when they are used properly.  To maintain their effectiveness, they should be used only when necessary.  Overuse of antibiotics has resulted in the development of super bugs that are resistant to treatment!    Home Care:  Only take medications as instructed by your medical team.  Do not drink alcohol while taking these medications.  A steam or ultrasonic humidifier can help congestion.  You can place a towel over your head and breathe in the steam from  hot water coming from a faucet.  Avoid close contacts especially the very young and the elderly.  Cover your mouth when you cough or sneeze.  Always remember to wash your hands.  Get Help Right Away If:  You develop worsening fever or throat pain.  You develop a severe head ache or visual changes.  Your symptoms persist after you have completed your treatment plan.  Make sure you  Understand these instructions.  Will watch your condition.  Will get help right away if you are not doing well or get worse.  Your e-visit answers were reviewed by a board certified advanced clinical practitioner to complete your personal care plan.  Depending on the condition, your plan could have included both over the counter or prescription medications.  If there is a problem please reply  once you have received a response from your provider.  Your safety is important to us.  If you have drug allergies check your prescription carefully.    You can use MyChart to ask questions about todays visit, request a non-urgent call back, or ask for a work or school excuse for 24 hours related to this e-Visit. If it has been greater than 24 hours you will need to follow up with your provider, or enter a new e-Visit to address those concerns.  You will get an e-mail in the next two days asking about your experience.  I hope that your e-visit has been valuable and will speed your recovery. Thank you for using e-visits.  Greater than 5 minutes, yet   less than 10 minutes of time have been spent researching, coordinating, and implementing care for this patient today  

## 2021-02-04 DIAGNOSIS — K130 Diseases of lips: Secondary | ICD-10-CM | POA: Diagnosis not present

## 2021-02-04 DIAGNOSIS — L700 Acne vulgaris: Secondary | ICD-10-CM | POA: Diagnosis not present

## 2021-02-04 DIAGNOSIS — Z79899 Other long term (current) drug therapy: Secondary | ICD-10-CM | POA: Diagnosis not present

## 2021-02-15 ENCOUNTER — Ambulatory Visit (INDEPENDENT_AMBULATORY_CARE_PROVIDER_SITE_OTHER): Payer: 59 | Admitting: Family Medicine

## 2021-02-15 ENCOUNTER — Other Ambulatory Visit: Payer: Self-pay

## 2021-02-15 ENCOUNTER — Encounter: Payer: Self-pay | Admitting: Family Medicine

## 2021-02-15 VITALS — BP 116/80 | HR 92 | Temp 97.8°F | Resp 16 | Ht 63.39 in | Wt 154.0 lb

## 2021-02-15 DIAGNOSIS — G2581 Restless legs syndrome: Secondary | ICD-10-CM | POA: Diagnosis not present

## 2021-02-15 DIAGNOSIS — Z0000 Encounter for general adult medical examination without abnormal findings: Secondary | ICD-10-CM | POA: Diagnosis not present

## 2021-02-15 DIAGNOSIS — F321 Major depressive disorder, single episode, moderate: Secondary | ICD-10-CM | POA: Insufficient documentation

## 2021-02-15 NOTE — Progress Notes (Signed)
Office Note 02/15/2021  CC:  Chief Complaint  Patient presents with  . Annual Exam    Pt is fasting    HPI:  Heidi Shields is a 25 y.o. White female who is here for annual health maintenance exam. She saw Dr. Elonda Husky for well-woman exam 07/2020-normal.  Feeling fine. Walks daily at lunch. Trying to do better at drinking more water.    She finished accutane for her acne. Had RLS on the med, still with some residual sx's currently--finished 1 wk ago. Some nights worse than others, occ impairs sleep. Hx of RLS mild sx's in the past but nothing consistent---mostly when drinks alcohol (which is rare). Hx of paradoxic response to benzo. Menses regular, not heavy.   Past Medical History:  Diagnosis Date  . Acne vulgaris   . Anxiety and depression   . Chlamydia 07/21/2017   + at GYN screening age 68.  Test of cure done and was negative (Dr. Elonda Husky).  . Hay fever   . History of mononucleosis syndrome 2012  . Irregular menstrual cycle     History reviewed. No pertinent surgical history.  Family History  Problem Relation Age of Onset  . Breast cancer Other        Aunt  . Diabetes Father   . High blood pressure Paternal Grandfather   . High blood pressure Paternal Grandmother   . Thyroid disease Sister     Social History   Socioeconomic History  . Marital status: Single    Spouse name: Not on file  . Number of children: Not on file  . Years of education: Not on file  . Highest education level: Not on file  Occupational History  . Not on file  Tobacco Use  . Smoking status: Never Smoker  . Smokeless tobacco: Never Used  Vaping Use  . Vaping Use: Never used  Substance and Sexual Activity  . Alcohol use: Yes    Comment: occ  . Drug use: No  . Sexual activity: Yes    Birth control/protection: Pill  Other Topics Concern  . Not on file  Social History Narrative   Married 2021.   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   No T/A/Ds.   Social Determinants  of Health   Financial Resource Strain: Low Risk   . Difficulty of Paying Living Expenses: Not hard at all  Food Insecurity: No Food Insecurity  . Worried About Charity fundraiser in the Last Year: Never true  . Ran Out of Food in the Last Year: Never true  Transportation Needs: No Transportation Needs  . Lack of Transportation (Medical): No  . Lack of Transportation (Non-Medical): No  Physical Activity: Insufficiently Active  . Days of Exercise per Week: 2 days  . Minutes of Exercise per Session: 30 min  Stress: No Stress Concern Present  . Feeling of Stress : Not at all  Social Connections: Moderately Integrated  . Frequency of Communication with Friends and Family: More than three times a week  . Frequency of Social Gatherings with Friends and Family: More than three times a week  . Attends Religious Services: More than 4 times per year  . Active Member of Clubs or Organizations: No  . Attends Archivist Meetings: Never  . Marital Status: Living with partner  Intimate Partner Violence: Not At Risk  . Fear of Current or Ex-Partner: No  . Emotionally Abused: No  . Physically Abused: No  . Sexually Abused: No  Outpatient Medications Prior to Visit  Medication Sig Dispense Refill  . desogestrel-ethinyl estradiol (APRI) 0.15-30 MG-MCG tablet Take 1 tablet by mouth daily. 84 tablet 3  . albuterol (VENTOLIN HFA) 108 (90 Base) MCG/ACT inhaler 1-2 puffs q4h prn coughing, wheezing, or chest tightness 1 each 0  . AMNESTEEM 20 MG capsule Take 20 mg by mouth daily.    . benzonatate (TESSALON) 200 MG capsule 1 cap po bid prn cough 20 capsule 0  . CLARAVIS 40 MG capsule Take 40 mg by mouth 2 (two) times daily.    . metroNIDAZOLE (METROGEL VAGINAL) 0.75 % vaginal gel Nightly x 5 nights 70 g 0  . triamcinolone (KENALOG) 0.1 % Apply topically.     No facility-administered medications prior to visit.    Allergies  Allergen Reactions  . Prednisone Anaphylaxis    Elevated BP,  HR and BS  . Duloxetine Nausea Only   ROS Review of Systems  Constitutional: Negative for appetite change, chills, fatigue and fever.  HENT: Negative for congestion, dental problem, ear pain and sore throat.   Eyes: Negative for discharge, redness and visual disturbance.  Respiratory: Negative for cough, chest tightness, shortness of breath and wheezing.   Cardiovascular: Negative for chest pain, palpitations and leg swelling.  Gastrointestinal: Negative for abdominal pain, blood in stool, diarrhea, nausea and vomiting.  Genitourinary: Negative for difficulty urinating, dysuria, flank pain, frequency, hematuria and urgency.  Musculoskeletal: Negative for arthralgias, back pain, joint swelling, myalgias and neck stiffness.  Skin: Negative for pallor and rash.  Neurological: Negative for dizziness, speech difficulty, weakness and headaches.  Hematological: Negative for adenopathy. Does not bruise/bleed easily.  Psychiatric/Behavioral: Negative for confusion and sleep disturbance. The patient is not nervous/anxious.     PE; Vitals with BMI 02/15/2021 07/24/2020 05/11/2020  Height 5' 3.386" $RemoveB'5\' 3"'egOebreM$  $RemoveBe'5\' 4"'ZXkfGXycm$   Weight 154 lbs 156 lbs 153 lbs  BMI 26.95 93.57 01.77  Systolic 939 030 -  Diastolic 80 87 -  Pulse 92 112 -   Exam chaperoned by Deveron Furlong, CMA.  Gen: Alert, well appearing.  Patient is oriented to person, place, time, and situation. AFFECT: pleasant, lucid thought and speech. ENT: Ears: EACs clear, normal epithelium.  TMs with good light reflex and landmarks bilaterally.  Eyes: no injection, icteris, swelling, or exudate.  EOMI, PERRLA. Nose: no drainage or turbinate edema/swelling.  No injection or focal lesion.  Mouth: lips without lesion/swelling.  Oral mucosa pink and moist.  Dentition intact and without obvious caries or gingival swelling.  Oropharynx without erythema, exudate, or swelling.  Neck: supple/nontender.  No LAD, mass, or TM.  Carotid pulses 2+ bilaterally, without  bruits. CV: RRR, no m/r/g.   LUNGS: CTA bilat, nonlabored resps, good aeration in all lung fields. ABD: soft, NT, ND, BS normal.  No hepatospenomegaly or mass.  No bruits. EXT: no clubbing, cyanosis, or edema.  Musculoskeletal: no joint swelling, erythema, warmth, or tenderness.  ROM of all joints intact. Skin - no sores or suspicious lesions or rashes or color changes   Pertinent labs:  Lab Results  Component Value Date   TSH 2.767 03/24/2018   Lab Results  Component Value Date   WBC 9.9 03/25/2018   HGB 11.9 (L) 03/25/2018   HCT 34.7 (L) 03/25/2018   MCV 86.1 03/25/2018   PLT 217 03/25/2018   Lab Results  Component Value Date   CREATININE 0.71 04/13/2020   BUN 11 04/13/2020   NA 140 04/13/2020   K 4.8 04/13/2020   CL 103  04/13/2020   CO2 23 04/13/2020   Lab Results  Component Value Date   ALT 11 (L) 03/24/2018   AST 13 (L) 03/24/2018   ALKPHOS 39 03/24/2018   BILITOT 0.7 03/24/2018   Lab Results  Component Value Date   HGBA1C 3.9 (L) 03/24/2018   ASSESSMENT AND PLAN:   Health maintenance exam: Reviewed age and gender appropriate health maintenance issues (prudent diet, regular exercise, health risks of tobacco and excessive alcohol, use of seatbelts, fire alarms in home, use of sunscreen).   Discussed behav mod techniques to get into habit of drinking more fluids throughout her day. Also reviewed age and gender appropriate health screening as well as vaccine recommendations. Vaccines: Tdap->pt declines.  HPV->pt declines.  Flu->UTD.  Covid->UTD. Labs: fasting HP labs. Cervical ca screening: per GYN MD.  She has some RLS that is only intermittent and brought out by drinking alcohol or with some meds (accutane, most recently).  Will check CBC, met panel, and TSH today. Screening for HLD and DM today.  An After Visit Summary was printed and given to the patient.  FOLLOW UP:  Return in about 1 year (around 02/15/2022) for annual CPE (fasting).  Signed:  Crissie Sickles, MD           02/15/2021

## 2021-02-15 NOTE — Patient Instructions (Signed)
Health Maintenance, Female Adopting a healthy lifestyle and getting preventive care are important in promoting health and wellness. Ask your health care provider about:  The right schedule for you to have regular tests and exams.  Things you can do on your own to prevent diseases and keep yourself healthy. What should I know about diet, weight, and exercise? Eat a healthy diet  Eat a diet that includes plenty of vegetables, fruits, low-fat dairy products, and lean protein.  Do not eat a lot of foods that are high in solid fats, added sugars, or sodium.   Maintain a healthy weight Body mass index (BMI) is used to identify weight problems. It estimates body fat based on height and weight. Your health care provider can help determine your BMI and help you achieve or maintain a healthy weight. Get regular exercise Get regular exercise. This is one of the most important things you can do for your health. Most adults should:  Exercise for at least 150 minutes each week. The exercise should increase your heart rate and make you sweat (moderate-intensity exercise).  Do strengthening exercises at least twice a week. This is in addition to the moderate-intensity exercise.  Spend less time sitting. Even light physical activity can be beneficial. Watch cholesterol and blood lipids Have your blood tested for lipids and cholesterol at 26 years of age, then have this test every 5 years. Have your cholesterol levels checked more often if:  Your lipid or cholesterol levels are high.  You are older than 26 years of age.  You are at high risk for heart disease. What should I know about cancer screening? Depending on your health history and family history, you may need to have cancer screening at various ages. This may include screening for:  Breast cancer.  Cervical cancer.  Colorectal cancer.  Skin cancer.  Lung cancer. What should I know about heart disease, diabetes, and high blood  pressure? Blood pressure and heart disease  High blood pressure causes heart disease and increases the risk of stroke. This is more likely to develop in people who have high blood pressure readings, are of African descent, or are overweight.  Have your blood pressure checked: ? Every 3-5 years if you are 18-39 years of age. ? Every year if you are 40 years old or older. Diabetes Have regular diabetes screenings. This checks your fasting blood sugar level. Have the screening done:  Once every three years after age 40 if you are at a normal weight and have a low risk for diabetes.  More often and at a younger age if you are overweight or have a high risk for diabetes. What should I know about preventing infection? Hepatitis B If you have a higher risk for hepatitis B, you should be screened for this virus. Talk with your health care provider to find out if you are at risk for hepatitis B infection. Hepatitis C Testing is recommended for:  Everyone born from 1945 through 1965.  Anyone with known risk factors for hepatitis C. Sexually transmitted infections (STIs)  Get screened for STIs, including gonorrhea and chlamydia, if: ? You are sexually active and are younger than 26 years of age. ? You are older than 26 years of age and your health care provider tells you that you are at risk for this type of infection. ? Your sexual activity has changed since you were last screened, and you are at increased risk for chlamydia or gonorrhea. Ask your health care provider   if you are at risk.  Ask your health care provider about whether you are at high risk for HIV. Your health care provider may recommend a prescription medicine to help prevent HIV infection. If you choose to take medicine to prevent HIV, you should first get tested for HIV. You should then be tested every 3 months for as long as you are taking the medicine. Pregnancy  If you are about to stop having your period (premenopausal) and  you may become pregnant, seek counseling before you get pregnant.  Take 400 to 800 micrograms (mcg) of folic acid every day if you become pregnant.  Ask for birth control (contraception) if you want to prevent pregnancy. Osteoporosis and menopause Osteoporosis is a disease in which the bones lose minerals and strength with aging. This can result in bone fractures. If you are 65 years old or older, or if you are at risk for osteoporosis and fractures, ask your health care provider if you should:  Be screened for bone loss.  Take a calcium or vitamin D supplement to lower your risk of fractures.  Be given hormone replacement therapy (HRT) to treat symptoms of menopause. Follow these instructions at home: Lifestyle  Do not use any products that contain nicotine or tobacco, such as cigarettes, e-cigarettes, and chewing tobacco. If you need help quitting, ask your health care provider.  Do not use street drugs.  Do not share needles.  Ask your health care provider for help if you need support or information about quitting drugs. Alcohol use  Do not drink alcohol if: ? Your health care provider tells you not to drink. ? You are pregnant, may be pregnant, or are planning to become pregnant.  If you drink alcohol: ? Limit how much you use to 0-1 drink a day. ? Limit intake if you are breastfeeding.  Be aware of how much alcohol is in your drink. In the [place] one drink equals one 12 oz bottle of beer (355 mL), one 5 oz glass of wine (148 mL), or one 1 oz glass of hard liquor (44 mL). General instructions  Schedule regular health, dental, and eye exams.  Stay current with your vaccines.  Tell your health care provider if: ? You often feel depressed. ? You have ever been abused or do not feel safe at home. Summary  Adopting a healthy lifestyle and getting preventive care are important in promoting health and wellness.  Follow your health care provider's instructions about healthy  diet, exercising, and getting tested or screened for diseases.  Follow your health care provider's instructions on monitoring your cholesterol and blood pressure. This information is not intended to replace advice given to you by your health care provider. Make sure you discuss any questions you have with your health care provider. Document Revised: 11/24/2018 Document Reviewed: 11/24/2018 Elsevier Patient Education  2021 Elsevier Inc.  

## 2021-02-17 LAB — COMPREHENSIVE METABOLIC PANEL
ALT: 18 IU/L (ref 0–32)
AST: 17 IU/L (ref 0–40)
Albumin/Globulin Ratio: 1.5 (ref 1.2–2.2)
Albumin: 4.6 g/dL (ref 3.9–5.0)
Alkaline Phosphatase: 61 IU/L (ref 44–121)
BUN/Creatinine Ratio: 19 (ref 9–23)
BUN: 13 mg/dL (ref 6–20)
Bilirubin Total: 0.4 mg/dL (ref 0.0–1.2)
CO2: 21 mmol/L (ref 20–29)
Calcium: 10 mg/dL (ref 8.7–10.2)
Chloride: 101 mmol/L (ref 96–106)
Creatinine, Ser: 0.68 mg/dL (ref 0.57–1.00)
Globulin, Total: 3.1 g/dL (ref 1.5–4.5)
Glucose: 78 mg/dL (ref 65–99)
Potassium: 4.2 mmol/L (ref 3.5–5.2)
Sodium: 138 mmol/L (ref 134–144)
Total Protein: 7.7 g/dL (ref 6.0–8.5)
eGFR: 124 mL/min/{1.73_m2} (ref >59)

## 2021-02-17 LAB — CBC WITH DIFFERENTIAL/PLATELET
Basophils Absolute: 0.1 10*3/uL (ref 0.0–0.2)
Basos: 1 %
EOS (ABSOLUTE): 0.4 10*3/uL (ref 0.0–0.4)
Eos: 5 %
Hematocrit: 42.5 % (ref 34.0–46.6)
Hemoglobin: 14.1 g/dL (ref 11.1–15.9)
Immature Grans (Abs): 0 10*3/uL (ref 0.0–0.1)
Immature Granulocytes: 0 %
Lymphocytes Absolute: 2 10*3/uL (ref 0.7–3.1)
Lymphs: 21 %
MCH: 29.6 pg (ref 26.6–33.0)
MCHC: 33.2 g/dL (ref 31.5–35.7)
MCV: 89 fL (ref 79–97)
Monocytes Absolute: 0.6 10*3/uL (ref 0.1–0.9)
Monocytes: 6 %
Neutrophils Absolute: 6.1 10*3/uL (ref 1.4–7.0)
Neutrophils: 67 %
Platelets: 271 10*3/uL (ref 150–450)
RBC: 4.77 x10E6/uL (ref 3.77–5.28)
RDW: 11.9 % (ref 11.7–15.4)
WBC: 9.2 10*3/uL (ref 3.4–10.8)

## 2021-02-17 LAB — LIPID PANEL
Chol/HDL Ratio: 3.1 ratio (ref 0.0–4.4)
Cholesterol, Total: 146 mg/dL (ref 100–199)
HDL: 47 mg/dL (ref >39)
LDL Chol Calc (NIH): 73 mg/dL (ref 0–99)
Triglycerides: 148 mg/dL (ref 0–149)
VLDL Cholesterol Cal: 26 mg/dL (ref 5–40)

## 2021-02-17 LAB — TSH: TSH: 1.94 u[IU]/mL (ref 0.450–4.500)

## 2021-05-02 ENCOUNTER — Telehealth: Payer: 59 | Admitting: Physician Assistant

## 2021-05-02 DIAGNOSIS — B370 Candidal stomatitis: Secondary | ICD-10-CM

## 2021-05-02 MED ORDER — NYSTATIN 100000 UNIT/ML MT SUSP: 5.0000 mL | Freq: Four times a day (QID) | OROMUCOSAL | 0 refills | Status: DC

## 2021-05-02 NOTE — Progress Notes (Signed)
E-Visit for Mouth Ulcers/Thrush  We are sorry that you are not feeling well.  Here is how we plan to help!  Based on what you have shared with me, it appears that you do have Thrush.     The following medications should decrease the discomfort and help with healing: Nystatin oral suspension   Mouth ulcers are painful areas in the mouth and gums. These are also known as "canker sores".  They can occur anywhere inside the mouth. While mostly harmless, mouth ulcers can be extremely uncomfortable and may make it difficult to eat, drink, and brush your teeth.  You may have more than 1 ulcer and they can vary and change in size. Mouth ulcers are not contagious and should not be confused with cold sores.  Cold sores appear on the lip or around the outside of the mouth and often begin with a tingling, burning or itching sensation.   While the exact causes are unknown, some common causes and factors that may aggravate mouth ulcers include: . Genetics - Sometimes mouth ulcers run in families . High alcohol intake . Acidic foods such as citrus fruits like pineapple, grapefruit, orange fruits/juices, may aggravate mouth ulcers . Other foods high in acidity or spice such as coffee, chocolate, chips, pretzels, eggs, nuts, cheese . Quitting smoking . Injury caused by biting the tongue or inside of the cheek . Diet lacking in B-12, zinc, folic acid or iron . Female hormone shifts with menstruation . Excessive fatigue, emotional stress or anxiety Prevention: . Talk to your doctor if you are taking meds that are known to cause mouth ulcers such as:   Anti-inflammatory drugs (for example Ibuprofen, Naproxen sodium), pain killers, Beta blockers, Oral nicotine replacement drugs, Some street drugs (heroin).   . Avoid allowing any tablets to dissolve in your mouth that are meant to swallowed whole . Avoid foods/drinks that trigger or worsen symptoms . Keep your mouth clean with daily brushing and flossing  Home  Care: . The goal with treatment is to ease the pain where ulcers occur and help them heal as quickly as possible.  There is no medical treatment to prevent mouth ulcers from coming back or recurring.  . Avoid spicy and acidic foods . Eat soft foods and avoid rough, crunchy foods . Avoid chewing gum . Do not use toothpaste that contains sodium lauryl sulphite . Use a straw to drink which helps avoid liquids toughing the ulcers near the front of your mouth . Use a very soft toothbrush . If you have dentures or dental hardware that you feel is not fitting well or contributing to his, please see your dentist. . Use saltwater mouthwash which helps healing. Dissolve a  teaspoon of salt in a glass of warm water. Swish around your mouth and spit it out. This can be used as needed if it is soothing.   GET HELP RIGHT AWAY IF: . Persistent ulcers require checking IN PERSON (face to face). Any mouth lesion lasting longer than a month should be seen by your DENTIST as soon as possible for evaluation for possible oral cancer. . If you have a non-painful ulcer in 1 or more areas of your mouth . Ulcers that are spreading, are very large or particularly painful . Ulcers last longer than one week without improving on treatment . If you develop a fever, swollen glands and begin to feel unwell . Ulcers that developed after starting a new medication MAKE SURE YOU:  Understand these instructions.  Will watch your condition.  Will get help right away if you are not doing well or get worse.  Your e-visit answers were reviewed by a board certified advanced clinical practitioner to complete your personal care plan.  Depending upon the condition, your plan could have included both over the counter or prescription medications.    Please review your pharmacy choice.  Be sure that the pharmacy you have chosen is open so that you can pick up your prescription now.  If there is a problem, you can message your provider  in MyChart to have the prescription routed to another pharmacy.    Your safety is important to [name].  If you have drug allergies check our prescription carefully.  For the next 24 hours you can use MyChart to ask questions about today's visit, request a non-urgent call back, or ask for a work or school excuse from your e-visit provider.  You will get an email with a survey asking about your experience and to give [name] any feedback.  I hope that your e-visit has been valuable and will speed your recovery.  I provided 5 minutes of non face-to-face time during this encounter for chart review and documentation.

## 2021-05-27 ENCOUNTER — Telehealth: Payer: Self-pay | Admitting: Adult Health

## 2021-05-27 NOTE — Telephone Encounter (Signed)
Pt has questions on what to look for due to stopping birth control in March 2022 & trying to conceive  States she was on [facility] for 84yrs  (Explained she may have to be seen to discuss)  Please advise & notify pt

## 2021-05-27 NOTE — Telephone Encounter (Signed)
Pt states she stopped her birth control in March 2022 and has questions in regards to trying to conceive.  States she has an app that tracks her cycle and has taken a few ovulation tests.  Seems to only be ovulating every other month but is having a period monthly.  Discussed with patient that since she has only been off [facility] for 3 months, that it can take some time for her body to adjust.  Advised intercourse every other day during ovulation time and to be actively trying for at least 6 months prior to making an appointment to discuss infertility.  Pt verbalized understanding with no further questions.

## 2021-07-12 ENCOUNTER — Encounter: Payer: Self-pay | Admitting: Family Medicine

## 2021-07-18 ENCOUNTER — Other Ambulatory Visit: Payer: Self-pay

## 2021-07-18 ENCOUNTER — Ambulatory Visit: Payer: 59 | Admitting: Obstetrics & Gynecology

## 2021-07-18 ENCOUNTER — Encounter: Payer: Self-pay | Admitting: Obstetrics & Gynecology

## 2021-11-11 ENCOUNTER — Encounter: Payer: Self-pay | Admitting: Obstetrics & Gynecology

## 2021-11-13 ENCOUNTER — Ambulatory Visit (INDEPENDENT_AMBULATORY_CARE_PROVIDER_SITE_OTHER): Payer: 59 | Admitting: *Deleted

## 2021-11-13 ENCOUNTER — Other Ambulatory Visit: Payer: Self-pay

## 2021-11-13 ENCOUNTER — Encounter: Payer: Self-pay | Admitting: *Deleted

## 2021-11-13 VITALS — Ht 64.0 in | Wt 164.0 lb

## 2021-11-13 DIAGNOSIS — Z3201 Encounter for pregnancy test, result positive: Secondary | ICD-10-CM | POA: Diagnosis not present

## 2021-11-13 LAB — POCT URINE PREGNANCY: Preg Test, Ur: POSITIVE — AB

## 2021-11-13 NOTE — Progress Notes (Signed)
Chart reviewed for nurse visit. Agree with plan of care.  Amori Cooperman A, NP 11/13/2021 5:31 PM   

## 2021-11-13 NOTE — Progress Notes (Signed)
   NURSE VISIT- PREGNANCY CONFIRMATION   SUBJECTIVE:  Heidi Shields is a 25 y.o. G1P0000 female at [redacted]w[redacted]d by certain LMP of Patient's last menstrual period was 10/15/2021. Here for pregnancy confirmation.  Home pregnancy test: positive x 7   She reports no complaints.  She is taking prenatal vitamins.    OBJECTIVE:  Ht 5\' 4"  (1.626 m)   Wt 164 lb (74.4 kg)   LMP 10/15/2021   BMI 28.15 kg/m   Appears well, in no apparent distress  Results for orders placed or performed in visit on 11/13/21 (from the past 24 hour(s))  POCT urine pregnancy   Collection Time: 11/13/21  3:52 PM  Result Value Ref Range   Preg Test, Ur Positive (A) Negative    ASSESSMENT: Positive pregnancy test, [redacted]w[redacted]d by LMP    PLAN: Schedule for dating ultrasound in 4 weeks Prenatal vitamins: continue   Nausea medicines: not currently needed   OB packet given: Yes  [redacted]w[redacted]d  11/13/2021 3:58 PM

## 2021-12-12 ENCOUNTER — Other Ambulatory Visit: Payer: Self-pay | Admitting: Obstetrics & Gynecology

## 2021-12-12 DIAGNOSIS — O3680X0 Pregnancy with inconclusive fetal viability, not applicable or unspecified: Secondary | ICD-10-CM

## 2021-12-13 ENCOUNTER — Other Ambulatory Visit: Payer: Self-pay

## 2021-12-13 ENCOUNTER — Ambulatory Visit (INDEPENDENT_AMBULATORY_CARE_PROVIDER_SITE_OTHER): Payer: 59

## 2021-12-13 DIAGNOSIS — O3680X0 Pregnancy with inconclusive fetal viability, not applicable or unspecified: Secondary | ICD-10-CM | POA: Diagnosis not present

## 2021-12-13 DIAGNOSIS — Z3A08 8 weeks gestation of pregnancy: Secondary | ICD-10-CM

## 2021-12-13 NOTE — Progress Notes (Signed)
[name] 8+3 wks,single IUP with yolk sac,FHR 178 bpm,CRL 24.73 mm,normal ovaries

## 2021-12-21 ENCOUNTER — Telehealth: Payer: 59 | Admitting: Emergency Medicine

## 2021-12-21 DIAGNOSIS — J208 Acute bronchitis due to other specified organisms: Secondary | ICD-10-CM | POA: Diagnosis not present

## 2021-12-21 DIAGNOSIS — B9689 Other specified bacterial agents as the cause of diseases classified elsewhere: Secondary | ICD-10-CM

## 2021-12-21 MED ORDER — AZITHROMYCIN 250 MG PO TABS: ORAL_TABLET | ORAL | 0 refills | Status: AC

## 2021-12-21 NOTE — Progress Notes (Signed)
We are sorry that you are not feeling well.  Here is how we plan to help!  Based on your presentation I believe you most likely have A cough due to bacteria.  When patients have a fever and a productive cough with a change in color or increased sputum production, we are concerned about bacterial bronchitis.  If left untreated it can progress to pneumonia.  If your symptoms do not improve with your treatment plan it is important that you contact your provider.   I have prescribed Azithromyin 250 mg: two tablets now and then one tablet daily for 4 additonal days    In addition you may use OTC coricidin, cough drops, delsym, dextromethorphan, guaifenesin, mucinex, robitussin, OR vicks for cough.  You may also use an antihistamine or allergy medication like zytec, claritin OR xyzal.  This will help with drainage which is probably contributing to your cough.  These medications are safe in pregnancy, but always check with your OB if you are worried or concerned.    From your responses in the eVisit questionnaire you describe inflammation in the upper respiratory tract which is causing a significant cough.  This is commonly called Bronchitis and has four common causes:   Allergies Viral Infections Acid Reflux Bacterial Infection Allergies, viruses and acid reflux are treated by controlling symptoms or eliminating the cause. An example might be a cough caused by taking certain blood pressure medications. You stop the cough by changing the medication. Another example might be a cough caused by acid reflux. Controlling the reflux helps control the cough.     HOME CARE Only take medications as instructed by your medical team. Complete the entire course of an antibiotic. Drink plenty of fluids and get plenty of rest. Avoid close contacts especially the very young and the elderly Cover your mouth if you cough or cough into your sleeve. Always remember to wash your hands A steam or ultrasonic humidifier can  help congestion.   GET HELP RIGHT AWAY IF: You develop worsening fever. You become short of breath You cough up blood. Your symptoms persist after you have completed your treatment plan MAKE SURE YOU  Understand these instructions. Will watch your condition. Will get help right away if you are not doing well or get worse.    Thank you for choosing an e-visit.  Your e-visit answers were reviewed by a board certified advanced clinical practitioner to complete your personal care plan. Depending upon the condition, your plan could have included both over the counter or prescription medications.  Please review your pharmacy choice. Make sure the pharmacy is open so you can pick up prescription now. If there is a problem, you may contact your provider through [employer] and have the prescription routed to another pharmacy.  Your safety is important to [name]. If you have drug allergies check your prescription carefully.   For the next 24 hours you can use MyChart to ask questions about today's visit, request a non-urgent call back, or ask for a work or school excuse. You will get an email in the next two days asking about your experience. I hope that your e-visit has been valuable and will speed your recovery.

## 2021-12-21 NOTE — Progress Notes (Signed)
I have spent 5 minutes in review of e-visit questionnaire, review and updating patient chart, medical decision making and response to patient.   Taniaya Rudder, PA-C    

## 2021-12-21 NOTE — Addendum Note (Signed)
Addended by: Rennis Harding on: 12/21/2021 10:32 AM   Modules accepted: Orders

## 2022-01-01 ENCOUNTER — Other Ambulatory Visit: Payer: Self-pay | Admitting: Obstetrics & Gynecology

## 2022-01-01 DIAGNOSIS — Z3682 Encounter for antenatal screening for nuchal translucency: Secondary | ICD-10-CM

## 2022-01-02 ENCOUNTER — Ambulatory Visit (INDEPENDENT_AMBULATORY_CARE_PROVIDER_SITE_OTHER): Payer: 59 | Admitting: Women's Health

## 2022-01-02 ENCOUNTER — Other Ambulatory Visit: Payer: Self-pay

## 2022-01-02 ENCOUNTER — Ambulatory Visit (INDEPENDENT_AMBULATORY_CARE_PROVIDER_SITE_OTHER): Payer: 59

## 2022-01-02 ENCOUNTER — Encounter: Payer: Self-pay | Admitting: Women's Health

## 2022-01-02 ENCOUNTER — Ambulatory Visit: Payer: 59 | Admitting: *Deleted

## 2022-01-02 VITALS — BP 131/75 | HR 94 | Wt 163.0 lb

## 2022-01-02 DIAGNOSIS — Z3402 Encounter for supervision of normal first pregnancy, second trimester: Secondary | ICD-10-CM

## 2022-01-02 DIAGNOSIS — Z3400 Encounter for supervision of normal first pregnancy, unspecified trimester: Secondary | ICD-10-CM | POA: Insufficient documentation

## 2022-01-02 DIAGNOSIS — O0990 Supervision of high risk pregnancy, unspecified, unspecified trimester: Secondary | ICD-10-CM | POA: Insufficient documentation

## 2022-01-02 DIAGNOSIS — Z3682 Encounter for antenatal screening for nuchal translucency: Secondary | ICD-10-CM

## 2022-01-02 DIAGNOSIS — Z3401 Encounter for supervision of normal first pregnancy, first trimester: Secondary | ICD-10-CM | POA: Diagnosis not present

## 2022-01-02 DIAGNOSIS — Z3481 Encounter for supervision of other normal pregnancy, first trimester: Secondary | ICD-10-CM | POA: Diagnosis not present

## 2022-01-02 DIAGNOSIS — Z3A11 11 weeks gestation of pregnancy: Secondary | ICD-10-CM

## 2022-01-02 LAB — POCT URINALYSIS DIPSTICK OB
Blood, UA: NEGATIVE
Glucose, UA: NEGATIVE
Ketones, UA: NEGATIVE
Leukocytes, UA: NEGATIVE
Nitrite, UA: NEGATIVE
POC,PROTEIN,UA: NEGATIVE

## 2022-01-02 NOTE — Progress Notes (Signed)
INITIAL OBSTETRICAL VISIT Patient name: Heidi Shields MRN [id number]  Date of birth: Jan 09, 1996 Chief Complaint:   Initial Prenatal Visit  History of Present Illness:   Heidi Shields is a 26 y.o. G2P0000 Caucasian female at [redacted]w[redacted]d by LMP c/w u/s at 9 weeks with an Estimated Date of Delivery: 07/22/22 being seen today for her initial obstetrical visit.   Patient's last menstrual period was 10/15/2021. Her obstetrical history is significant for primigravida.   Today she reports no complaints.  Last pap 07/24/20. Results were: NILM w/ HRHPV negative  Depression screen [facility] 2/9 01/02/2022 02/15/2021 07/24/2020 08/20/2018 08/03/2018  Decreased Interest 0 0 0 0 0  Down, Depressed, Hopeless 0 0 0 0 0  PHQ - 2 Score 0 0 0 0 0  Altered sleeping 0 - 0 - 1  Tired, decreased energy 0 - 0 - 0  Change in appetite 1 - 0 - 1  Feeling bad or failure about yourself  0 - 0 - 0  Trouble concentrating 0 - 0 - 1  Moving slowly or fidgety/restless 0 - 0 - 0  Suicidal thoughts 0 - 0 - 0  PHQ-9 Score 1 - 0 - 3  Difficult doing work/chores - - Not difficult at all - Not difficult at all     GAD 7 : Generalized Anxiety Score 01/02/2022 07/24/2020  Nervous, Anxious, on Edge 0 0  Control/stop worrying 0 0  Worry too much - different things 0 0  Trouble relaxing 0 0  Restless 0 0  Easily annoyed or irritable 0 0  Afraid - awful might happen 0 0  Total GAD 7 Score 0 0  Anxiety Difficulty - Not difficult at all     Review of Systems:   Pertinent items are noted in HPI Denies cramping/contractions, leakage of fluid, vaginal bleeding, abnormal vaginal discharge w/ itching/odor/irritation, headaches, visual changes, shortness of breath, chest pain, abdominal pain, severe nausea/vomiting, or problems with urination or bowel movements unless otherwise stated above.  Pertinent History Reviewed:  Reviewed past medical,surgical, social, obstetrical and family history.  Reviewed problem list, medications and  allergies. OB History  Gravida Para Term Preterm AB Living  1 0 0 0 0 0  SAB IAB Ectopic Multiple Live Births  0 0 0 0 0    # Outcome Date GA Lbr Len/2nd Weight Sex Delivery Anes PTL Lv  1 Current            Physical Assessment:   Vitals:   01/02/22 1438  BP: 131/75  Pulse: 94  Weight: 163 lb (73.9 kg)  Body mass index is 27.98 kg/m.       Physical Examination:  General appearance - well appearing, and in no distress  Mental status - alert, oriented to person, place, and time  Psych:  She has a normal mood and affect  Skin - warm and dry, normal color, no suspicious lesions noted  Chest - effort normal, all lung fields clear to auscultation bilaterally  Heart - normal rate and regular rhythm  Abdomen - soft, nontender  Extremities:  No swelling or varicosities noted  Thin prep pap is not done   Chaperone: N/A    TODAY'S NT [name] 11+2 wks,measurements c/w dates,fhr 176 bpm,CRL 59.70 mm,NT 2.6 mm,NB present,normal ovaries  Results for orders placed or performed in visit on 01/02/22 (from the past 24 hour(s))  POC Urinalysis Dipstick OB   Collection Time: 01/02/22  3:04 PM  Result Value Ref Range  Color, UA     Clarity, UA     Glucose, UA Negative Negative   Bilirubin, UA     Ketones, UA neg    Spec Grav, UA     Blood, UA neg    pH, UA     POC,PROTEIN,UA Negative Negative, Trace, Small (1+), Moderate (2+), Large (3+), 4+   Urobilinogen, UA     Nitrite, UA neg    Leukocytes, UA Negative Negative   Appearance     Odor      Assessment & Plan:  1) Low-Risk Pregnancy G1P0000 at [redacted]w[redacted]d with an Estimated Date of Delivery: 07/22/22   2) Initial OB visit  Meds: No orders of the defined types were placed in this encounter.   Initial labs obtained Continue prenatal vitamins Reviewed n/v relief measures and warning s/s to report Reviewed recommended weight gain based on pre-gravid BMI Encouraged well-balanced diet Genetic & carrier screening discussed: requests Panorama  and NT/IT, declines Horizon  Ultrasound discussed; fetal survey: requested CCNC completed> form faxed if has or is planning to apply for medicaid The nature of [place] [facility] [employer] with multiple MDs and other Advanced Practice Providers was explained to patient; also emphasized that fellows, residents, and students are part of our team. Does have home bp cuff. Office bp cuff given: no. Rx sent: n/a. Check bp weekly, let us know if consistently >140/90.   Follow-up: Return in about 4 weeks (around 01/30/2022) for LROB, 2nd IT, CNM, in person.   Orders Placed This Encounter  Procedures   Urine Culture   GC/Chlamydia Probe Amp   Integrated 1   Genetic Screening   CBC/D/Plt+RPR+Rh+ABO+RubIgG...   Hgb Fractionation Cascade   POC Urinalysis Dipstick OB    Cheral Marker CNM, [facility] 01/02/2022 3:43 PM

## 2022-01-02 NOTE — Progress Notes (Signed)
[name] 11+2 wks,measurements c/w dates,fhr 176 bpm,CRL 59.70 mm,NT 2.6 mm,NB present,normal ovaries

## 2022-01-02 NOTE — Patient Instructions (Signed)
Deisha, thank you for choosing our office today! We appreciate the opportunity to meet your healthcare needs. You may receive a short survey by mail, e-mail, or through MyChart. If you are happy with your care we would appreciate if you could take just a few minutes to complete the survey questions. We read all of your comments and take your feedback very seriously. Thank you again for choosing our office.  [facility] [place] ([address] [place], [place] [address]) Entrance C, located off of [address] Free 24/7 valet parking   Nausea & Vomiting Have saltine crackers or pretzels by your bed and eat a few bites before you raise your head out of bed in the morning Eat small frequent meals throughout the day instead of large meals Drink plenty of fluids throughout the day to stay hydrated, just don't drink a lot of fluids with your meals.  This can make your stomach fill up faster making you feel sick Do not brush your teeth right after you eat Products with real ginger are good for nausea, like ginger ale and ginger hard candy Make sure it says made with real ginger! Sucking on sour candy like lemon heads is also good for nausea If your prenatal vitamins make you nauseated, take them at night so you will sleep through the nausea Sea Bands If you feel like you need medicine for the nausea & vomiting please let us know If you are unable to keep any fluids or food down please let us know   Constipation Drink plenty of fluid, preferably water, throughout the day Eat foods high in fiber such as fruits, vegetables, and grains Exercise, such as walking, is a good way to keep your bowels regular Drink warm fluids, especially warm prune juice, or decaf coffee Eat a 1/2 cup of real oatmeal (not instant), 1/2 cup applesauce, and 1/2-1 cup warm prune juice every day If needed, you may take Colace (docusate sodium) stool softener  once or twice a day to help keep the stool soft.  If you still are having problems with constipation, you may take Miralax once daily as needed to help keep your bowels regular.   Home Blood Pressure Monitoring for Patients   Your provider has recommended that you check your blood pressure (BP) at least once a week at home. If you do not have a blood pressure cuff at home, one will be provided for you. Contact your provider if you have not received your monitor within 1 week.   Helpful Tips for Accurate Home Blood Pressure Checks  Don't smoke, exercise, or drink caffeine 30 minutes before checking your BP Use the restroom before checking your BP (a full bladder can raise your pressure) Relax in a comfortable upright chair Feet on the ground Left arm resting comfortably on a flat surface at the level of your heart Legs uncrossed Back supported Sit quietly and don't talk Place the cuff on your bare arm Adjust snuggly, so that only two fingertips can fit between your skin and the top of the cuff Check 2 readings separated by at least one minute Keep a log of your BP readings For a visual, please reference this diagram: http://ccnc.care/bpdiagram  Provider Name: [facility]     Phone: [phone]  Zone 1: ALL CLEAR  Continue to monitor your symptoms:  BP reading is less than 140 (top number) or less than 90 (bottom   number)  No right upper stomach pain No headaches or seeing spots No feeling nauseated or throwing up No swelling in face and hands  Zone 2: CAUTION Call your doctor's office for any of the following:  BP reading is greater than 140 (top number) or greater than 90 (bottom number)  Stomach pain under your ribs in the middle or right side Headaches or seeing spots Feeling nauseated or throwing up Swelling in face and hands  Zone 3: EMERGENCY  Seek immediate medical care if you have any of the following:  BP reading is greater than160 (top number) or greater than  110 (bottom number) Severe headaches not improving with Tylenol Serious difficulty catching your breath Any worsening symptoms from Zone 2    First Trimester of Pregnancy The first trimester of pregnancy is from week 1 until the end of week 12 (months 1 through 3). A week after a sperm fertilizes an egg, the egg will implant on the wall of the uterus. This embryo will begin to develop into a baby. Genes from you and your partner are forming the baby. The female genes determine whether the baby is a boy or a girl. At 6-8 weeks, the eyes and face are formed, and the heartbeat can be seen on ultrasound. At the end of 12 weeks, all the baby's organs are formed.  Now that you are pregnant, you will want to do everything you can to have a healthy baby. Two of the most important things are to get good prenatal care and to follow your health care provider's instructions. Prenatal care is all the medical care you receive before the baby's birth. This care will help prevent, find, and treat any problems during the pregnancy and childbirth. BODY CHANGES Your body goes through many changes during pregnancy. The changes vary from woman to woman.  You may gain or lose a couple of pounds at first. You may feel sick to your stomach (nauseous) and throw up (vomit). If the vomiting is uncontrollable, call your health care provider. You may tire easily. You may develop headaches that can be relieved by medicines approved by your health care provider. You may urinate more often. Painful urination may mean you have a bladder infection. You may develop heartburn as a result of your pregnancy. You may develop constipation because certain hormones are causing the muscles that push waste through your intestines to slow down. You may develop hemorrhoids or swollen, bulging veins (varicose veins). Your breasts may begin to grow larger and become tender. Your nipples may stick out more, and the tissue that surrounds them  (areola) may become darker. Your gums may bleed and may be sensitive to brushing and flossing. Dark spots or blotches (chloasma, mask of pregnancy) may develop on your face. This will likely fade after the baby is born. Your menstrual periods will stop. You may have a loss of appetite. You may develop cravings for certain kinds of food. You may have changes in your emotions from day to day, such as being excited to be pregnant or being concerned that something may go wrong with the pregnancy and baby. You may have more vivid and strange dreams. You may have changes in your hair. These can include thickening of your hair, rapid growth, and changes in texture. Some women also have hair loss during or after pregnancy, or hair that feels dry or thin. Your hair will most likely return to normal after your baby is born. WHAT TO EXPECT AT YOUR PRENATAL   VISITS During a routine prenatal visit: You will be weighed to make sure you and the baby are growing normally. Your blood pressure will be taken. Your abdomen will be measured to track your baby's growth. The fetal heartbeat will be listened to starting around week 10 or 12 of your pregnancy. Test results from any previous visits will be discussed. Your health care provider may ask you: How you are feeling. If you are feeling the baby move. If you have had any abnormal symptoms, such as leaking fluid, bleeding, severe headaches, or abdominal cramping. If you have any questions. Other tests that may be performed during your first trimester include: Blood tests to find your blood type and to check for the presence of any previous infections. They will also be used to check for low iron levels (anemia) and Rh antibodies. Later in the pregnancy, blood tests for diabetes will be done along with other tests if problems develop. Urine tests to check for infections, diabetes, or protein in the urine. An ultrasound to confirm the proper growth and development  of the baby. An amniocentesis to check for possible genetic problems. Fetal screens for spina bifida and Down syndrome. You may need other tests to make sure you and the baby are doing well. HOME CARE INSTRUCTIONS  Medicines Follow your health care provider's instructions regarding medicine use. Specific medicines may be either safe or unsafe to take during pregnancy. Take your prenatal vitamins as directed. If you develop constipation, try taking a stool softener if your health care provider approves. Diet Eat regular, well-balanced meals. Choose a variety of foods, such as meat or vegetable-based protein, fish, milk and low-fat dairy products, vegetables, fruits, and whole grain breads and cereals. Your health care provider will help you determine the amount of weight gain that is right for you. Avoid raw meat and uncooked cheese. These carry germs that can cause birth defects in the baby. Eating four or five small meals rather than three large meals a day may help relieve nausea and vomiting. If you start to feel nauseous, eating a few soda crackers can be helpful. Drinking liquids between meals instead of during meals also seems to help nausea and vomiting. If you develop constipation, eat more high-fiber foods, such as fresh vegetables or fruit and whole grains. Drink enough fluids to keep your urine clear or pale yellow. Activity and Exercise Exercise only as directed by your health care provider. Exercising will help you: Control your weight. Stay in shape. Be prepared for labor and delivery. Experiencing pain or cramping in the lower abdomen or low back is a good sign that you should stop exercising. Check with your health care provider before continuing normal exercises. Try to avoid standing for long periods of time. Move your legs often if you must stand in one place for a long time. Avoid heavy lifting. Wear low-heeled shoes, and practice good posture. You may continue to have sex  unless your health care provider directs you otherwise. Relief of Pain or Discomfort Wear a good support bra for breast tenderness.   Take warm sitz baths to soothe any pain or discomfort caused by hemorrhoids. Use hemorrhoid cream if your health care provider approves.   Rest with your legs elevated if you have leg cramps or low back pain. If you develop varicose veins in your legs, wear support hose. Elevate your feet for 15 minutes, 3-4 times a day. Limit salt in your diet. Prenatal Care Schedule your prenatal visits by the   twelfth week of pregnancy. They are usually scheduled monthly at first, then more often in the last 2 months before delivery. Write down your questions. Take them to your prenatal visits. Keep all your prenatal visits as directed by your health care provider. Safety Wear your seat belt at all times when driving. Make a list of emergency phone numbers, including numbers for family, friends, the hospital, and police and fire departments. General Tips Ask your health care provider for a referral to a local prenatal education class. Begin classes no later than at the beginning of month 6 of your pregnancy. Ask for help if you have counseling or nutritional needs during pregnancy. Your health care provider can offer advice or refer you to specialists for help with various needs. Do not use hot tubs, steam rooms, or saunas. Do not douche or use tampons or scented sanitary pads. Do not cross your legs for long periods of time. Avoid cat litter boxes and soil used by cats. These carry germs that can cause birth defects in the baby and possibly loss of the fetus by miscarriage or stillbirth. Avoid all smoking, herbs, alcohol, and medicines not prescribed by your health care provider. Chemicals in these affect the formation and growth of the baby. Schedule a dentist appointment. At home, brush your teeth with a soft toothbrush and be gentle when you floss. SEEK MEDICAL CARE IF:   You have dizziness. You have mild pelvic cramps, pelvic pressure, or nagging pain in the abdominal area. You have persistent nausea, vomiting, or diarrhea. You have a bad smelling vaginal discharge. You have pain with urination. You notice increased swelling in your face, hands, legs, or ankles. SEEK IMMEDIATE MEDICAL CARE IF:  You have a fever. You are leaking fluid from your vagina. You have spotting or bleeding from your vagina. You have severe abdominal cramping or pain. You have rapid weight gain or loss. You vomit blood or material that looks like coffee grounds. You are exposed to German measles and have never had them. You are exposed to fifth disease or chickenpox. You develop a severe headache. You have shortness of breath. You have any kind of trauma, such as from a fall or a car accident. Document Released: 11/25/2001 Document Revised: 04/17/2014 Document Reviewed: 10/11/2013 Exit[employer] Patient Information 2015 [employer]. This information is not intended to replace advice given to you by your health care provider. Make sure you discuss any questions you have with your health care provider.  

## 2022-01-04 LAB — GC/CHLAMYDIA PROBE AMP
Chlamydia trachomatis, NAA: NEGATIVE
Neisseria Gonorrhoeae by PCR: NEGATIVE

## 2022-01-04 LAB — URINE CULTURE: Organism ID, Bacteria: NO GROWTH

## 2022-01-06 LAB — CBC/D/PLT+RPR+RH+ABO+RUBIGG...
Antibody Screen: NEGATIVE
Basophils Absolute: 0.1 10*3/uL (ref 0.0–0.2)
Basos: 1 %
EOS (ABSOLUTE): 0.4 10*3/uL (ref 0.0–0.4)
Eos: 4 %
HCV Ab: <0.1 s/co ratio (ref 0.0–0.9)
HIV Screen 4th Generation wRfx: NONREACTIVE
Hematocrit: 37.8 % (ref 34.0–46.6)
Hemoglobin: 13.2 g/dL (ref 11.1–15.9)
Hepatitis B Surface Ag: NEGATIVE
Immature Grans (Abs): 0.1 10*3/uL (ref 0.0–0.1)
Immature Granulocytes: 1 %
Lymphocytes Absolute: 1.9 10*3/uL (ref 0.7–3.1)
Lymphs: 18 %
MCH: 30.4 pg (ref 26.6–33.0)
MCHC: 34.9 g/dL (ref 31.5–35.7)
MCV: 87 fL (ref 79–97)
Monocytes Absolute: 0.9 10*3/uL (ref 0.1–0.9)
Monocytes: 8 %
Neutrophils Absolute: 7.7 10*3/uL — ABNORMAL HIGH (ref 1.4–7.0)
Neutrophils: 68 %
Platelets: 249 10*3/uL (ref 150–450)
RBC: 4.34 x10E6/uL (ref 3.77–5.28)
RDW: 12.6 % (ref 11.7–15.4)
RPR Ser Ql: NONREACTIVE
Rh Factor: POSITIVE
Rubella Antibodies, IGG: 1.89 index (ref >0.99)
WBC: 11 10*3/uL — ABNORMAL HIGH (ref 3.4–10.8)

## 2022-01-06 LAB — INTEGRATED 1
Crown Rump Length: 59.7 mm
Gest. Age on Collection Date: 12.3 weeks
Maternal Age at EDD: 26.6 yr
Nuchal Translucency (NT): 2.6 mm
Number of Fetuses: 1
PAPP-A Value: 1010.1 ng/mL
Weight: 163 [lb_av]

## 2022-01-06 LAB — HCV INTERPRETATION

## 2022-01-06 LAB — HGB FRACTIONATION CASCADE
Hgb A2: 2.5 % (ref 1.8–3.2)
Hgb A: 97.5 % (ref 96.4–98.8)
Hgb F: 0 % (ref 0.0–2.0)
Hgb S: 0 %

## 2022-01-21 ENCOUNTER — Encounter: Payer: Self-pay | Admitting: Women's Health

## 2022-01-29 ENCOUNTER — Other Ambulatory Visit: Payer: Self-pay

## 2022-01-29 ENCOUNTER — Encounter: Payer: Self-pay | Admitting: Women's Health

## 2022-01-29 ENCOUNTER — Ambulatory Visit (INDEPENDENT_AMBULATORY_CARE_PROVIDER_SITE_OTHER): Payer: 59 | Admitting: Women's Health

## 2022-01-29 VITALS — BP 112/66 | HR 91 | Wt 167.0 lb

## 2022-01-29 DIAGNOSIS — Z1379 Encounter for other screening for genetic and chromosomal anomalies: Secondary | ICD-10-CM

## 2022-01-29 DIAGNOSIS — Z363 Encounter for antenatal screening for malformations: Secondary | ICD-10-CM

## 2022-01-29 DIAGNOSIS — Z3402 Encounter for supervision of normal first pregnancy, second trimester: Secondary | ICD-10-CM

## 2022-01-29 NOTE — Patient Instructions (Signed)
Viera, thank you for choosing our office today! We appreciate the opportunity to meet your healthcare needs. You may receive a short survey by mail, e-mail, or through MyChart. If you are happy with your care we would appreciate if you could take just a few minutes to complete the survey questions. We read all of your comments and take your feedback very seriously. Thank you again for choosing our office.  [facility] Team at [facility] [place] ([address] [place], [place] [address]) Entrance C, located off of [address] Free 24/7 valet parking  Go to [email or web address] to register for FREE online childbirth classes  Call the office ([phone]) or go to [facility] if: You begin to severe cramping Your water breaks.  Sometimes it is a big gush of fluid, sometimes it is just a trickle that keeps getting your panties wet or running down your legs You have vaginal bleeding.  It is normal to have a small amount of spotting if your cervix was checked.   [place] Pediatricians/Family Doctors [facility] ([facility]): [address], [phone]           [facility]: [address], [phone]                [facility] ([facility]): [address], [phone] (call to ask if accepting patients) [facility]: [address], [place], [phone]    [place] Pediatricians/Family Doctors [facility] ([facility]): [address], [phone] [facility]: [address], [phone] [facility]: [address], [phone]  [place] [facility]  [facility] ([facility]): [phone] [facility]: [address], [phone]   [place] [facility] Doctors [facility]: [address], [phone]  [place] [facility]  [facility]: [address], [phone]  Home Blood Pressure Monitoring for Patients   Your provider has recommended that you check your blood pressure (BP) at least once a week at home. If you do not have a blood pressure cuff at home, one will be provided for you. Contact your provider if you have not received your monitor within 1 week.   Helpful Tips for Accurate Home Blood Pressure Checks  Don't smoke, exercise, or drink caffeine 30 minutes before checking your BP Use the restroom before checking your BP (a full bladder can raise your pressure) Relax in a comfortable upright chair Feet on the ground Left arm resting comfortably on a flat surface at the level of your heart Legs uncrossed Back supported Sit quietly and don't talk Place the cuff on your bare arm Adjust snuggly, so that only two fingertips can fit between your skin and the top of the cuff Check 2 readings separated by at least one minute Keep a log of your BP readings For a visual, please reference this diagram: http://[facility].care/bpdiagram  Provider Name: [facility]     Phone: [phone]  Zone 1: ALL CLEAR  Continue to monitor your symptoms:  BP reading is less than 140 (top number) or less than 90 (bottom number)  No right upper stomach pain No headaches or seeing spots No feeling nauseated or throwing up No swelling in face and hands  Zone 2: CAUTION Call your doctor's office for any of the following:  BP reading is greater than 140 (top number) or greater than   90 (bottom number)  Stomach pain under your ribs in the middle or right side Headaches or seeing spots Feeling nauseated or throwing up Swelling in face and hands  Zone 3: EMERGENCY  Seek immediate medical care if you have any of the following:  BP reading is greater than160 (top number) or greater than 110 (bottom number) Severe headaches not improving with Tylenol Serious difficulty catching your breath Any worsening symptoms from  Zone 2     Second Trimester of Pregnancy The second trimester is from week 14 through week 27 (months 4 through 6). The second trimester is often a time when you feel your best. Your body has adjusted to being pregnant, and you begin to feel better physically. Usually, morning sickness has lessened or quit completely, you may have more energy, and you may have an increase in appetite. The second trimester is also a time when the fetus is growing rapidly. At the end of the sixth month, the fetus is about 9 inches long and weighs about 1 pounds. You will likely begin to feel the baby move (quickening) between 16 and 20 weeks of pregnancy. Body changes during your second trimester Your body continues to go through many changes during your second trimester. The changes vary from woman to woman. Your weight will continue to increase. You will notice your lower abdomen bulging out. You may begin to get stretch marks on your hips, abdomen, and breasts. You may develop headaches that can be relieved by medicines. The medicines should be approved by your health care provider. You may urinate more often because the fetus is pressing on your bladder. You may develop or continue to have heartburn as a result of your pregnancy. You may develop constipation because certain hormones are causing the muscles that push waste through your intestines to slow down. You may develop hemorrhoids or swollen, bulging veins (varicose veins). You may have back pain. This is caused by: Weight gain. Pregnancy hormones that are relaxing the joints in your pelvis. A shift in weight and the muscles that support your balance. Your breasts will continue to grow and they will continue to become tender. Your gums may bleed and may be sensitive to brushing and flossing. Dark spots or blotches (chloasma, mask of pregnancy) may develop on your face. This will likely fade after the baby is born. A dark line from your belly button to  the pubic area (linea nigra) may appear. This will likely fade after the baby is born. You may have changes in your hair. These can include thickening of your hair, rapid growth, and changes in texture. Some women also have hair loss during or after pregnancy, or hair that feels dry or thin. Your hair will most likely return to normal after your baby is born.  What to expect at prenatal visits During a routine prenatal visit: You will be weighed to make sure you and the fetus are growing normally. Your blood pressure will be taken. Your abdomen will be measured to track your baby's growth. The fetal heartbeat will be listened to. Any test results from the previous visit will be discussed.  Your health care provider may ask you: How you are feeling. If you are feeling the baby move. If you have had any abnormal symptoms, such as leaking fluid, bleeding, severe headaches, or abdominal cramping. If you are using any tobacco products, including cigarettes, chewing tobacco, and electronic cigarettes. If you have any questions.  Other tests that may be performed during   your second trimester include: Blood tests that check for: Low iron levels (anemia). High blood sugar that affects pregnant women (gestational diabetes) between 24 and 28 weeks. Rh antibodies. This is to check for a protein on red blood cells (Rh factor). Urine tests to check for infections, diabetes, or protein in the urine. An ultrasound to confirm the proper growth and development of the baby. An amniocentesis to check for possible genetic problems. Fetal screens for spina bifida and Down syndrome. HIV (human immunodeficiency virus) testing. Routine prenatal testing includes screening for HIV, unless you choose not to have this test.  Follow these instructions at home: Medicines Follow your health care provider's instructions regarding medicine use. Specific medicines may be either safe or unsafe to take during  pregnancy. Take a prenatal vitamin that contains at least 600 micrograms (mcg) of folic acid. If you develop constipation, try taking a stool softener if your health care provider approves. Eating and drinking Eat a balanced diet that includes fresh fruits and vegetables, whole grains, good sources of protein such as meat, eggs, or tofu, and low-fat dairy. Your health care provider will help you determine the amount of weight gain that is right for you. Avoid raw meat and uncooked cheese. These carry germs that can cause birth defects in the baby. If you have low calcium intake from food, talk to your health care provider about whether you should take a daily calcium supplement. Limit foods that are high in fat and processed sugars, such as fried and sweet foods. To prevent constipation: Drink enough fluid to keep your urine clear or pale yellow. Eat foods that are high in fiber, such as fresh fruits and vegetables, whole grains, and beans. Activity Exercise only as directed by your health care provider. Most women can continue their usual exercise routine during pregnancy. Try to exercise for 30 minutes at least 5 days a week. Stop exercising if you experience uterine contractions. Avoid heavy lifting, wear low heel shoes, and practice good posture. A sexual relationship may be continued unless your health care provider directs you otherwise. Relieving pain and discomfort Wear a good support bra to prevent discomfort from breast tenderness. Take warm sitz baths to soothe any pain or discomfort caused by hemorrhoids. Use hemorrhoid cream if your health care provider approves. Rest with your legs elevated if you have leg cramps or low back pain. If you develop varicose veins, wear support hose. Elevate your feet for 15 minutes, 3-4 times a day. Limit salt in your diet. Prenatal Care Write down your questions. Take them to your prenatal visits. Keep all your prenatal visits as told by your health  care provider. This is important. Safety Wear your seat belt at all times when driving. Make a list of emergency phone numbers, including numbers for family, friends, the hospital, and police and fire departments. General instructions Ask your health care provider for a referral to a local prenatal education class. Begin classes no later than the beginning of month 6 of your pregnancy. Ask for help if you have counseling or nutritional needs during pregnancy. Your health care provider can offer advice or refer you to specialists for help with various needs. Do not use hot tubs, steam rooms, or saunas. Do not douche or use tampons or scented sanitary pads. Do not cross your legs for long periods of time. Avoid cat litter boxes and soil used by cats. These carry germs that can cause birth defects in the baby and possibly loss of the   fetus by miscarriage or stillbirth. Avoid all smoking, herbs, alcohol, and unprescribed drugs. Chemicals in these products can affect the formation and growth of the baby. Do not use any products that contain nicotine or tobacco, such as cigarettes and e-cigarettes. If you need help quitting, ask your health care provider. Visit your dentist if you have not gone yet during your pregnancy. Use a soft toothbrush to brush your teeth and be gentle when you floss. Contact a health care provider if: You have dizziness. You have mild pelvic cramps, pelvic pressure, or nagging pain in the abdominal area. You have persistent nausea, vomiting, or diarrhea. You have a bad smelling vaginal discharge. You have pain when you urinate. Get help right away if: You have a fever. You are leaking fluid from your vagina. You have spotting or bleeding from your vagina. You have severe abdominal cramping or pain. You have rapid weight gain or weight loss. You have shortness of breath with chest pain. You notice sudden or extreme swelling of your face, hands, ankles, feet, or legs. You  have not felt your baby move in over an hour. You have severe headaches that do not go away when you take medicine. You have vision changes. Summary The second trimester is from week 14 through week 27 (months 4 through 6). It is also a time when the fetus is growing rapidly. Your body goes through many changes during pregnancy. The changes vary from woman to woman. Avoid all smoking, herbs, alcohol, and unprescribed drugs. These chemicals affect the formation and growth your baby. Do not use any tobacco products, such as cigarettes, chewing tobacco, and e-cigarettes. If you need help quitting, ask your health care provider. Contact your health care provider if you have any questions. Keep all prenatal visits as told by your health care provider. This is important. This information is not intended to replace advice given to you by your health care provider. Make sure you discuss any questions you have with your health care provider. Document Released: 11/25/2001 Document Revised: 05/08/2016 Document Reviewed: 02/01/2013 Elsevier Interactive Patient Education  2017 [employer]  

## 2022-01-29 NOTE — Progress Notes (Signed)
LOW-RISK PREGNANCY VISIT Patient name: Heidi Shields MRN [id number]  Date of birth: 08-27-1996 Chief Complaint:   Routine Prenatal Visit (2nd IT)  History of Present Illness:   Heidi Shields is a 26 y.o. G1P0000 female at [redacted]w[redacted]d with an Estimated Date of Delivery: 07/22/22 being seen today for ongoing management of a low-risk pregnancy.   Today she reports  restless legs, had before pregnancy too . Contractions: Not present.  .  Movement: Absent. denies leaking of fluid.  Depression screen [facility] 2/9 01/02/2022 02/15/2021 07/24/2020 08/20/2018 08/03/2018  Decreased Interest 0 0 0 0 0  Down, Depressed, Hopeless 0 0 0 0 0  PHQ - 2 Score 0 0 0 0 0  Altered sleeping 0 - 0 - 1  Tired, decreased energy 0 - 0 - 0  Change in appetite 1 - 0 - 1  Feeling bad or failure about yourself  0 - 0 - 0  Trouble concentrating 0 - 0 - 1  Moving slowly or fidgety/restless 0 - 0 - 0  Suicidal thoughts 0 - 0 - 0  PHQ-9 Score 1 - 0 - 3  Difficult doing work/chores - - Not difficult at all - Not difficult at all     GAD 7 : Generalized Anxiety Score 01/02/2022 07/24/2020  Nervous, Anxious, on Edge 0 0  Control/stop worrying 0 0  Worry too much - different things 0 0  Trouble relaxing 0 0  Restless 0 0  Easily annoyed or irritable 0 0  Afraid - awful might happen 0 0  Total GAD 7 Score 0 0  Anxiety Difficulty - Not difficult at all      Review of Systems:   Pertinent items are noted in HPI Denies abnormal vaginal discharge w/ itching/odor/irritation, headaches, visual changes, shortness of breath, chest pain, abdominal pain, severe nausea/vomiting, or problems with urination or bowel movements unless otherwise stated above. Pertinent History Reviewed:  Reviewed past medical,surgical, social, obstetrical and family history.  Reviewed problem list, medications and allergies. Physical Assessment:   Vitals:   01/29/22 1353  BP: 112/66  Pulse: 91  Weight: 167 lb (75.8 kg)  Body mass index is 28.67  kg/m.        Physical Examination:   General appearance: Well appearing, and in no distress  Mental status: Alert, oriented to person, place, and time  Skin: Warm & dry  Cardiovascular: Normal heart rate noted  Respiratory: Normal respiratory effort, no distress  Abdomen: Soft, gravid, nontender  Pelvic: Cervical exam deferred         Extremities: Edema: None  Fetal Status: Fetal Heart Rate (bpm): 166    Movement: Absent    Chaperone: N/A   No results found for this or any previous visit (from the past 24 hour(s)).  Assessment & Plan:  1) Low-risk pregnancy G1P0000 at [redacted]w[redacted]d with an Estimated Date of Delivery: 07/22/22   2) Restless legs, hgb 13.2 on 1/19, not taking pnv daily b/c makes her sick, already has gummies, can try flinstones   Meds: No orders of the defined types were placed in this encounter.  Labs/procedures today: 2nd IT  Plan:  Continue routine obstetrical care  Next visit: prefers will be in person for anatomy u/s     Reviewed: Preterm labor symptoms and general obstetric precautions including but not limited to vaginal bleeding, contractions, leaking of fluid and fetal movement were reviewed in detail with the patient.  All questions were answered. Does have home bp cuff. Office  bp cuff given: not applicable. Check bp weekly, let us know if consistently >140 and/or >90.  Follow-up: Return in about 4 weeks (around 02/26/2022) for LROB, [id number], CNM, in person.  No future appointments.  Orders Placed This Encounter  Procedures   US OB Comp + 14 Wk   INTEGRATED 2   [place], [place] [facility] 01/29/2022 2:16 PM

## 2022-01-31 LAB — INTEGRATED 2
AFP MoM: 1.15
Alpha-Fetoprotein: 36.7 ng/mL
Crown Rump Length: 59.7 mm
DIA MoM: 0.75
DIA Value: 112.2 pg/mL
Estriol, Unconjugated: 0.82 ng/mL
Gest. Age on Collection Date: 12.3 weeks
Gestational Age: 16.1 weeks
Maternal Age at EDD: 26.6 yr
Nuchal Translucency (NT): 2.6 mm
Nuchal Translucency MoM: 1.95
Number of Fetuses: 1
PAPP-A MoM: 1.26
PAPP-A Value: 1010.1 ng/mL
Test Results:: NEGATIVE
Weight: 163 [lb_av]
Weight: 163 [lb_av]
hCG MoM: 0.73
hCG Value: 26.2 IU/mL
uE3 MoM: 0.85

## 2022-02-26 ENCOUNTER — Other Ambulatory Visit: Payer: Self-pay

## 2022-02-26 ENCOUNTER — Ambulatory Visit (INDEPENDENT_AMBULATORY_CARE_PROVIDER_SITE_OTHER): Payer: 59

## 2022-02-26 ENCOUNTER — Encounter: Payer: 59 | Admitting: Women's Health

## 2022-02-26 ENCOUNTER — Ambulatory Visit (INDEPENDENT_AMBULATORY_CARE_PROVIDER_SITE_OTHER): Payer: 59 | Admitting: Women's Health

## 2022-02-26 ENCOUNTER — Other Ambulatory Visit: Payer: 59

## 2022-02-26 ENCOUNTER — Encounter: Payer: Self-pay | Admitting: Women's Health

## 2022-02-26 VITALS — BP 114/68 | HR 100 | Wt 177.0 lb

## 2022-02-26 DIAGNOSIS — Z363 Encounter for antenatal screening for malformations: Secondary | ICD-10-CM | POA: Diagnosis not present

## 2022-02-26 DIAGNOSIS — Z3402 Encounter for supervision of normal first pregnancy, second trimester: Secondary | ICD-10-CM | POA: Diagnosis not present

## 2022-02-26 NOTE — Patient Instructions (Signed)
Heidi Shields, thank you for choosing our office today! We appreciate the opportunity to meet your healthcare needs. You may receive a short survey by mail, e-mail, or through [employer]. If you are happy with your care we would appreciate if you could take just a few minutes to complete the survey questions. We read all of your comments and take your feedback very seriously. Thank you again for choosing our office.  ?Center for [employer] Team at [facility] ?[facility] at [facility] ?([address] [place], [place] [address]) ?Entrance C, located off of [address] ?Free 24/7 valet parking  ?Go to [email or web address] to register for FREE online childbirth classes ? ?Call the office [phone]) or go to [facility] if: ?You begin to severe cramping ?Your water breaks.  Sometimes it is a big gush of fluid, sometimes it is just a trickle that keeps getting your panties wet or running down your legs ?You have vaginal bleeding.  It is normal to have a small amount of spotting if your cervix was checked.  ? ?[place] Pediatricians/Family Doctors ?[facility]): [address], [phone]           ?[facility]: [address], [phone]                ?[facility]): [address], [phone] (call to ask if accepting patients) ?[facility] Department: [address], [place], [phone]   ? ?[place] Pediatricians/Family Doctors ?[facility]): [address], [phone] ?[facility]: [address] [place], [phone] ?[facility]: [address], [phone] ? ?[employer] Family [facility]  ?[facility]): [phone] ?[facility]: [address], [phone]  ? ?[facility] Family Doctors ?[facility]: [address] [phone] ? ?[facility]  ?[facility]: [phone], [phone] ? ?Home Blood Pressure Monitoring for Patients  ? ?Your provider has recommended that you check your blood pressure (BP) at least once a week at home. If you do not have a blood pressure cuff at home, one will be provided for you. Contact your provider if you have not received your monitor within 1 week.  ? ?Helpful Tips for Accurate Home Blood Pressure Checks  ?Don't smoke, exercise, or drink caffeine 30 minutes before checking your BP ?Use the restroom before checking your BP (a full bladder can raise your pressure) ?Relax in a comfortable upright chair ?Feet on the ground ?Left arm resting comfortably on a flat surface at the level of your heart ?Legs uncrossed ?Back supported ?Sit quietly and don't talk ?Place the cuff on your bare arm ?Adjust snuggly, so that only two fingertips can fit between your skin and the top of the cuff ?Check 2 readings separated by at least one minute ?Keep a log of your BP readings ?For a visual, please reference this diagram: http://ccnc.care/bpdiagram ? ?Provider Name: [facility]     Phone: [phone] ? ?Zone 1: ALL CLEAR  ?Continue to monitor your symptoms:  ?BP reading is less than 140 (top number) or less than 90 (bottom number)  ?No right upper stomach pain ?No headaches or seeing spots ?No feeling nauseated or throwing up ?No swelling in face and hands ? ?Zone 2: CAUTION ?Call your doctor's office for any of the following:  ?BP reading is greater than 140 (top number) or greater than  90 (bottom number)  ?Stomach pain under your ribs in the middle or right side ?Headaches or seeing spots ?Feeling nauseated or throwing up ?Swelling in face and hands ? ?Zone 3: EMERGENCY  ?Seek immediate medical care if you have any of the following:  ?BP reading is greater than160 (top number) or greater than 110 (bottom number) ?Severe headaches not improving with Tylenol ?Serious difficulty catching your breath ?Any worsening symptoms from  Zone 2  ? ?  ?Second Trimester of Pregnancy ?The second trimester is from week 14 through week 27 (months 4 through 6). The second trimester is often a time when you feel your best. Your body has adjusted to being pregnant, and you begin to feel better physically. Usually, morning sickness has lessened or quit completely, you may have more energy, and you may have an increase in appetite. The second trimester is also a time when the fetus is growing rapidly. At the end of the sixth month, the fetus is about 9 inches long and weighs about 1? pounds. You will likely begin to feel the baby move (quickening) between 16 and 20 weeks of pregnancy. ?Body changes during your second trimester ?Your body continues to go through many changes during your second trimester. The changes vary from woman to woman. ?Your weight will continue to increase. You will notice your lower abdomen bulging out. ?You may begin to get stretch marks on your hips, abdomen, and breasts. ?You may develop headaches that can be relieved by medicines. The medicines should be approved by your health care provider. ?You may urinate more often because the fetus is pressing on your bladder. ?You may develop or continue to have heartburn as a result of your pregnancy. ?You may develop constipation because certain hormones are causing the muscles that push waste through your intestines to slow down. ?You may develop hemorrhoids or swollen, bulging veins (varicose veins). ?You may have back pain. This is caused by: ?Weight gain. ?Pregnancy hormones that are relaxing the joints in your pelvis. ?A shift in weight and the muscles that support your balance. ?Your breasts will continue to grow and they will continue to become tender. ?Your gums may bleed and may be sensitive to brushing and flossing. ?Dark spots or blotches (chloasma, mask of pregnancy) may develop on your face. This will likely fade after the baby is born. ?A dark line from your belly button to  the pubic area (linea nigra) may appear. This will likely fade after the baby is born. ?You may have changes in your hair. These can include thickening of your hair, rapid growth, and changes in texture. Some women also have hair loss during or after pregnancy, or hair that feels dry or thin. Your hair will most likely return to normal after your baby is born. ? ?What to expect at prenatal visits ?During a routine prenatal visit: ?You will be weighed to make sure you and the fetus are growing normally. ?Your blood pressure will be taken. ?Your abdomen will be measured to track your baby's growth. ?The fetal heartbeat will be listened to. ?Any test results from the previous visit will be discussed. ? ?Your health care provider may ask you: ?How you are feeling. ?If you are feeling the baby move. ?If you have had any abnormal symptoms, such as leaking fluid, bleeding, severe headaches, or abdominal cramping. ?If you are using any tobacco products, including cigarettes, chewing tobacco, and electronic cigarettes. ?If you have any questions. ? ?Other tests that may be performed during  your second trimester include: ?Blood tests that check for: ?Low iron levels (anemia). ?High blood sugar that affects pregnant women (gestational diabetes) between 59 and 28 weeks. ?Rh antibodies. This is to check for a protein on red blood cells (Rh factor). ?Urine tests to check for infections, diabetes, or protein in the urine. ?An ultrasound to confirm the proper growth and development of the baby. ?An amniocentesis to check for possible genetic problems. ?Fetal screens for spina bifida and Down syndrome. ?HIV (human immunodeficiency virus) testing. Routine prenatal testing includes screening for HIV, unless you choose not to have this test. ? ?Follow these instructions at home: ?Medicines ?Follow your health care provider's instructions regarding medicine use. Specific medicines may be either safe or unsafe to take during  pregnancy. ?Take a prenatal vitamin that contains at least 600 micrograms (mcg) of folic acid. ?If you develop constipation, try taking a stool softener if your health care provider approves. ?Eating and drinking ?Eat

## 2022-02-26 NOTE — Progress Notes (Signed)
? ? ?LOW-RISK PREGNANCY VISIT ?Patient name: [name] MRN [id number]  Date of birth: [birth year] ?Chief Complaint:   ?Routine Prenatal Visit and Pregnancy Ultrasound ? ?History of Present Illness:   ?Heidi Shields is a 26 y.o. G1P0000 female at [redacted]w[redacted]d with an Estimated Date of Delivery: 07/22/22 being seen today for ongoing management of a low-risk pregnancy.  ? ?Today she reports no complaints. Contractions: Not present. Vag. Bleeding: None.  Movement: Present. denies leaking of fluid. ? ?Depression screen [facility] 2/9 01/02/2022 02/15/2021 07/24/2020 08/20/2018 08/03/2018  ?Decreased Interest 0 0 0 0 0  ?Down, Depressed, Hopeless 0 0 0 0 0  ?PHQ - 2 Score 0 0 0 0 0  ?Altered sleeping 0 - 0 - 1  ?Tired, decreased energy 0 - 0 - 0  ?Change in appetite 1 - 0 - 1  ?Feeling bad or failure about yourself  0 - 0 - 0  ?Trouble concentrating 0 - 0 - 1  ?Moving slowly or fidgety/restless 0 - 0 - 0  ?Suicidal thoughts 0 - 0 - 0  ?PHQ-9 Score 1 - 0 - 3  ?Difficult doing work/chores - - Not difficult at all - Not difficult at all  ? ?  ?GAD 7 : Generalized Anxiety Score 01/02/2022 07/24/2020  ?Nervous, Anxious, on Edge 0 0  ?Control/stop worrying 0 0  ?Worry too much - different things 0 0  ?Trouble relaxing 0 0  ?Restless 0 0  ?Easily annoyed or irritable 0 0  ?Afraid - awful might happen 0 0  ?Total GAD 7 Score 0 0  ?Anxiety Difficulty - Not difficult at all  ? ? ?  ?Review of Systems:   ?Pertinent items are noted in HPI ?Denies abnormal vaginal discharge w/ itching/odor/irritation, headaches, visual changes, shortness of breath, chest pain, abdominal pain, severe nausea/vomiting, or problems with urination or bowel movements unless otherwise stated above. ?Pertinent History Reviewed:  ?Reviewed past medical,surgical, social, obstetrical and family history.  ?Reviewed problem list, medications and allergies. ?Physical Assessment:  ? ?Vitals:  ? 02/26/22 1053  ?BP: 114/68  ?Pulse: 100  ?Weight: 177 lb (80.3 kg)  ?Body mass index is  30.38 kg/m?. ?  ?     Physical Examination:  ? General appearance: Well appearing, and in no distress ? Mental status: Alert, oriented to person, place, and time ? Skin: Warm & dry ? Cardiovascular: Normal heart rate noted ? Respiratory: Normal respiratory effort, no distress ? Abdomen: Soft, gravid, nontender ? Pelvic: Cervical exam deferred        ? Extremities: Edema: None ? ?Fetal Status:     Movement: Present  [name] 19+1 wks,breech,cx 3.9 cm,posterior fundal placenta gr 0,normal ovaries,SVP of fluid 4.9 cm,FHR 161 bpm,EFW 417 g 99%,anatomy complete,no obvious abnormalities  ? ?Chaperone: N/A   ?No results found for this or any previous visit (from the past 24 hour(s)).  ?Assessment & Plan:  ?1) Low-risk pregnancy G1P0000 at [redacted]w[redacted]d with an Estimated Date of Delivery: 07/22/22  ? ?  ?Meds: No orders of the defined types were placed in this encounter. ? ?Labs/procedures today: U/S ? ?Plan:  Continue routine obstetrical care  ?Next visit: prefers in person   ? ?Reviewed: Preterm labor symptoms and general obstetric precautions including but not limited to vaginal bleeding, contractions, leaking of fluid and fetal movement were reviewed in detail with the patient.  All questions were answered. Does have home bp cuff. Office bp cuff given: not applicable. Check bp weekly, let us know if consistently >140  and/or >90. ? ?Follow-up: No follow-ups on file. ? ?Future Appointments  ?Date Time Provider Department Center  ?02/26/2022 11:10 AM Cheral Marker, CNM [facility]  ? ? ?No orders of the defined types were placed in this encounter. ? ?Cheral Marker CNM, WHNP-BC ?02/26/2022 ?11:01 AM  ?

## 2022-02-26 NOTE — Progress Notes (Signed)
[name] 19+1 wks,breech,cx 3.9 cm,posterior fundal placenta gr 0,normal ovaries,SVP of fluid 4.9 cm,FHR 161 bpm,EFW 417 g 99%,anatomy complete,no obvious abnormalities  ?

## 2022-03-26 ENCOUNTER — Encounter: Payer: Self-pay | Admitting: Women's Health

## 2022-03-26 ENCOUNTER — Ambulatory Visit (INDEPENDENT_AMBULATORY_CARE_PROVIDER_SITE_OTHER): Payer: 59 | Admitting: Women's Health

## 2022-03-26 VITALS — BP 111/76 | HR 82 | Wt 189.0 lb

## 2022-03-26 DIAGNOSIS — Z3A23 23 weeks gestation of pregnancy: Secondary | ICD-10-CM

## 2022-03-26 DIAGNOSIS — Z3402 Encounter for supervision of normal first pregnancy, second trimester: Secondary | ICD-10-CM

## 2022-03-26 NOTE — Progress Notes (Signed)
? ? ?LOW-RISK PREGNANCY VISIT ?Patient name: Heidi Shields MRN [id number]  Date of birth: 09-15-1996 ?Chief Complaint:   ?Routine Prenatal Visit ? ?History of Present Illness:   ?Heidi Shields is a 26 y.o. G1P0000 female at [redacted]w[redacted]d with an Estimated Date of Delivery: 07/22/22 being seen today for ongoing management of a low-risk pregnancy.  ? ?Today she reports  12lb wt gain since last visit, eats a lot of sides, no real protein. Tries to walk as much as she can. Drinks water w/ lemon . Contractions: Not present. Vag. Bleeding: None.  Movement: Present. denies leaking of fluid. ? ? ?  01/02/2022  ?  3:05 PM 02/15/2021  ?  8:34 AM 07/24/2020  ?  1:42 PM 08/20/2018  ? 10:04 AM 08/03/2018  ? 12:24 PM  ?Depression screen PHQ 2/9  ?Decreased Interest 0 0 0 0 0  ?Down, Depressed, Hopeless 0 0 0 0 0  ?PHQ - 2 Score 0 0 0 0 0  ?Altered sleeping 0  0  1  ?Tired, decreased energy 0  0  0  ?Change in appetite 1  0  1  ?Feeling bad or failure about yourself  0  0  0  ?Trouble concentrating 0  0  1  ?Moving slowly or fidgety/restless 0  0  0  ?Suicidal thoughts 0  0  0  ?PHQ-9 Score 1  0  3  ?Difficult doing work/chores   Not difficult at all  Not difficult at all  ? ?  ? ?  01/02/2022  ?  3:05 PM 07/24/2020  ?  1:43 PM  ?GAD 7 : Generalized Anxiety Score  ?Nervous, Anxious, on Edge 0 0  ?Control/stop worrying 0 0  ?Worry too much - different things 0 0  ?Trouble relaxing 0 0  ?Restless 0 0  ?Easily annoyed or irritable 0 0  ?Afraid - awful might happen 0 0  ?Total GAD 7 Score 0 0  ?Anxiety Difficulty  Not difficult at all  ? ? ?  ?Review of Systems:   ?Pertinent items are noted in HPI ?Denies abnormal vaginal discharge w/ itching/odor/irritation, headaches, visual changes, shortness of breath, chest pain, abdominal pain, severe nausea/vomiting, or problems with urination or bowel movements unless otherwise stated above. ?Pertinent History Reviewed:  ?Reviewed past medical,surgical, social, obstetrical and family history.  ?Reviewed  problem list, medications and allergies. ?Physical Assessment:  ? ?Vitals:  ? 03/26/22 1412  ?BP: 111/76  ?Pulse: 82  ?Weight: 189 lb (85.7 kg)  ?Body mass index is 32.44 kg/m?. ?  ?     Physical Examination:  ? General appearance: Well appearing, and in no distress ? Mental status: Alert, oriented to person, place, and time ? Skin: Warm & dry ? Cardiovascular: Normal heart rate noted ? Respiratory: Normal respiratory effort, no distress ? Abdomen: Soft, gravid, nontender ? Pelvic: Cervical exam deferred        ? Extremities: Edema: Trace ? ?Fetal Status: Fetal Heart Rate (bpm): 145 Fundal Height: 24 cm Movement: Present   ? ?Chaperone: N/A   ?No results found for this or any previous visit (from the past 24 hour(s)).  ?Assessment & Plan:  ?1) Low-risk pregnancy G1P0000 at [redacted]w[redacted]d with an Estimated Date of Delivery: 07/22/22  ? ?2) Excess weight gain since last visit, 25lb total, to decrease carbs, increase protein, continue walking/drinking water w/ lemon ?  ?Meds: No orders of the defined types were placed in this encounter. ? ?Labs/procedures today: none ? ?Plan:  Continue  routine obstetrical care  ?Next visit: prefers will be in person for pn2    ? ?Reviewed: Preterm labor symptoms and general obstetric precautions including but not limited to vaginal bleeding, contractions, leaking of fluid and fetal movement were reviewed in detail with the patient.  All questions were answered. Does have home bp cuff. Office bp cuff given: not applicable. Check bp weekly, let us know if consistently >140 and/or >90. ? ?Follow-up: Return in about 4 weeks (around 04/23/2022) for LROB, PN2, CNM, in person. ? ?Future Appointments  ?Date Time Provider Department Center  ?04/28/2022  8:50 AM [facility]  ? ? ?No orders of the defined types were placed in this encounter. ? ?Cheral Marker CNM, WHNP-BC ?03/26/2022 ?2:40 PM  ?

## 2022-03-26 NOTE — Patient Instructions (Signed)
Heidi Shields, thank you for choosing our office today! We appreciate the opportunity to meet your healthcare needs. You may receive a short survey by mail, e-mail, or through [employer]. If you are happy with your care we would appreciate if you could take just a few minutes to complete the survey questions. We read all of your comments and take your feedback very seriously. Thank you again for choosing our office.  ?Center for [employer] Team at [facility] ? ?Women's & Children's Center at [facility] ?([address] [place], [place] [address]) ?Entrance C, located off of [address] ?Free 24/7 valet parking  ? ?You will have your sugar test next visit.  Please do not eat or drink anything after midnight the night before you come, not even water.  You will be here for at least two hours.  Please make an appointment online for the bloodwork at [email or web address] for 8:00am (or as close to this as possible). Make sure you select the [facility] service center.  ? ?CLASSES: Go to [email or web address] to register for classes (childbirth, breastfeeding, waterbirth, infant CPR, daddy bootcamp, etc.) ? ?Call the office [phone]) or go to [facility] if: ?You begin to have strong, frequent contractions ?Your water breaks.  Sometimes it is a big gush of fluid, sometimes it is just a trickle that keeps getting your panties wet or running down your legs ?You have vaginal bleeding.  It is normal to have a small amount of spotting if your cervix was checked.  ?You don't feel your baby moving like normal.  If you don't, get you something to eat and drink and lay down and focus on feeling your baby move.   If your baby is still not moving like normal, you should call the office or go to [facility]. ? ?Call the office [phone]) or go to [facility] hospital for these signs of pre-eclampsia: ?Severe headache that does not go away with Tylenol ?Visual changes- seeing spots, double, blurred vision ?Pain under your right breast or upper  abdomen that does not go away with Tums or heartburn medicine ?Nausea and/or vomiting ?Severe swelling in your hands, feet, and face  ? ? ?[place] Pediatricians/Family Doctors ?[facility]): [address], [phone]           ?[facility]: [address], [phone]                ?[facility]): [address], [phone] (call to ask if accepting patients) ?[facility] Department: [address], [place], [phone]   ? ?[place] Pediatricians/Family Doctors ?[facility]): [address], [phone] ?[facility]: [address] [place], [phone] ?[facility]: [address], [phone] ? ?[employer] Family Doctors  ?[facility]): [phone] ?[facility]: [address], [phone]  ? ?[facility] Family Doctors ?[facility]: [address] [phone] ? ?[facility]  ?[facility]: [phone], [phone] ? ?Home Blood Pressure Monitoring for Patients  ? ?Your provider has recommended that you check your blood pressure (BP) at least once a week at home. If you do not have a blood pressure cuff at home, one will be provided for you. Contact your provider if you have not received your monitor within 1 week.  ? ?Helpful Tips for Accurate Home Blood Pressure Checks  ?Don't smoke, exercise, or drink  caffeine 30 minutes before checking your BP ?Use the restroom before checking your BP (a full bladder can raise your pressure) ?Relax in a comfortable upright chair ?Feet on the ground ?Left arm resting comfortably on a flat surface at the level of your heart ?Legs uncrossed ?Back supported ?Sit quietly and don't talk ?Place the cuff on your bare arm ?Adjust snuggly, so that only two fingertips can fit between your skin and the top of the cuff ?Check 2  readings separated by at least one minute ?Keep a log of your BP readings ?For a visual, please reference this diagram: http://ccnc.care/bpdiagram ? ?Provider Name: [facility]     Phone: [phone] ? ?Zone 1: ALL CLEAR  ?Continue to monitor your symptoms:  ?BP reading is less than 140 (top number) or less than 90 (bottom number)  ?No right upper stomach pain ?No headaches or seeing spots ?No feeling nauseated or throwing up ?No swelling in face and hands ? ?Zone 2: CAUTION ?Call your doctor's office for any of the following:  ?BP reading is greater than 140 (top number) or greater than 90 (bottom number)  ?Stomach pain under your ribs in the middle or right side ?Headaches or seeing spots ?Feeling nauseated or throwing up ?Swelling in face and hands ? ?Zone 3: EMERGENCY  ?Seek immediate medical care if you have any of the following:  ?BP reading is greater than160 (top number) or greater than 110 (bottom number) ?Severe headaches not improving with Tylenol ?Serious difficulty catching your breath ?Any worsening symptoms from Zone 2  ? ?Second Trimester of Pregnancy ?The second trimester is from week 13 through week 28, months 4 through 6. The second trimester is often a time when you feel your best. Your body has also adjusted to being pregnant, and you begin to feel better physically. Usually, morning sickness has lessened or quit completely, you may have more energy, and you may have an increase in appetite. The second trimester is also a time when the fetus is growing rapidly. At the end of the sixth month, the fetus is about 9 inches long and weighs about 1? pounds. You will likely begin to feel the baby move (quickening) between 18 and 20 weeks of the pregnancy. ?BODY CHANGES ?Your body goes through many changes during pregnancy. The changes vary from woman to woman.  ?Your weight will continue to increase. You will notice your lower abdomen bulging out. ?You may begin to get stretch marks on your  hips, abdomen, and breasts. ?You may develop headaches that can be relieved by medicines approved by your health care provider. ?You may urinate more often because the fetus is pressing on your bladder. ?You may develop or continue to have heartburn as a result of your pregnancy. ?You may develop constipation because certain hormones are causing the muscles that push waste through your intestines to slow down. ?You may develop hemorrhoids or swollen, bulging veins (varicose veins). ?You may have back pain because of the weight gain and pregnancy hormones relaxing your joints between the bones in your pelvis and as a result of a shift in weight and the muscles that support your balance. ?Your breasts will continue to grow and be tender. ?Your gums may bleed and may be sensitive to brushing and flossing. ?Dark spots or blotches (chloasma, mask of pregnancy) may develop on your face. This will likely fade after the baby is born. ?A dark line from your belly button to the pubic area (linea nigra) may appear. This  will likely fade after the baby is born. ?You may have changes in your hair. These can include thickening of your hair, rapid growth, and changes in texture. Some women also have hair loss during or after pregnancy, or hair that feels dry or thin. Your hair will most likely return to normal after your baby is born. ?WHAT TO EXPECT AT YOUR PRENATAL VISITS ?During a routine prenatal visit: ?You will be weighed to make sure you and the fetus are growing normally. ?Your blood pressure will be taken. ?Your abdomen will be measured to track your baby's growth. ?The fetal heartbeat will be listened to. ?Any test results from the previous visit will be discussed. ?Your health care provider may ask you: ?How you are feeling. ?If you are feeling the baby move. ?If you have had any abnormal symptoms, such as leaking fluid, bleeding, severe headaches, or abdominal cramping. ?If you have any questions. ?Other tests that may  be performed during your second trimester include: ?Blood tests that check for: ?Low iron levels (anemia). ?Gestational diabetes (between 24 and 28 weeks). ?Rh antibodies. ?Urine tests to check for infections

## 2022-04-28 ENCOUNTER — Encounter: Payer: Self-pay | Admitting: Women's Health

## 2022-04-28 ENCOUNTER — Other Ambulatory Visit: Payer: 59

## 2022-04-28 ENCOUNTER — Ambulatory Visit (INDEPENDENT_AMBULATORY_CARE_PROVIDER_SITE_OTHER): Payer: 59 | Admitting: Women's Health

## 2022-04-28 VITALS — BP 111/68 | HR 110 | Wt 196.0 lb

## 2022-04-28 DIAGNOSIS — Z3A27 27 weeks gestation of pregnancy: Secondary | ICD-10-CM

## 2022-04-28 DIAGNOSIS — Z3402 Encounter for supervision of normal first pregnancy, second trimester: Secondary | ICD-10-CM

## 2022-04-28 DIAGNOSIS — Z131 Encounter for screening for diabetes mellitus: Secondary | ICD-10-CM | POA: Diagnosis not present

## 2022-04-28 NOTE — Progress Notes (Signed)
? ? ?LOW-RISK PREGNANCY VISIT ?Patient name: Heidi Shields MRN [id number]  Date of birth: 20-Apr-1996 ?Chief Complaint:   ?Routine Prenatal Visit (PN2) ? ?History of Present Illness:   ?Heidi Shields is a 26 y.o. G1P0000 female at [redacted]w[redacted]d with an Estimated Date of Delivery: 07/22/22 being seen today for ongoing management of a low-risk pregnancy.  ? ?Today she reports no complaints. Contractions: Not present. Vag. Bleeding: None.  Movement: Present. denies leaking of fluid. ? ? ?  04/28/2022  ?  9:11 AM 01/02/2022  ?  3:05 PM 02/15/2021  ?  8:34 AM 07/24/2020  ?  1:42 PM 08/20/2018  ? 10:04 AM  ?Depression screen PHQ 2/9  ?Decreased Interest 0 0 0 0 0  ?Down, Depressed, Hopeless 0 0 0 0 0  ?PHQ - 2 Score 0 0 0 0 0  ?Altered sleeping 0 0  0   ?Tired, decreased energy 0 0  0   ?Change in appetite 0 1  0   ?Feeling bad or failure about yourself  0 0  0   ?Trouble concentrating 0 0  0   ?Moving slowly or fidgety/restless 0 0  0   ?Suicidal thoughts 0 0  0   ?PHQ-9 Score 0 1  0   ?Difficult doing work/chores    Not difficult at all   ? ?  ? ?  04/28/2022  ?  9:12 AM 01/02/2022  ?  3:05 PM 07/24/2020  ?  1:43 PM  ?GAD 7 : Generalized Anxiety Score  ?Nervous, Anxious, on Edge 0 0 0  ?Control/stop worrying 0 0 0  ?Worry too much - different things 0 0 0  ?Trouble relaxing 0 0 0  ?Restless 0 0 0  ?Easily annoyed or irritable 0 0 0  ?Afraid - awful might happen 0 0 0  ?Total GAD 7 Score 0 0 0  ?Anxiety Difficulty   Not difficult at all  ? ? ?  ?Review of Systems:   ?Pertinent items are noted in HPI ?Denies abnormal vaginal discharge w/ itching/odor/irritation, headaches, visual changes, shortness of breath, chest pain, abdominal pain, severe nausea/vomiting, or problems with urination or bowel movements unless otherwise stated above. ?Pertinent History Reviewed:  ?Reviewed past medical,surgical, social, obstetrical and family history.  ?Reviewed problem list, medications and allergies. ?Physical Assessment:  ? ?Vitals:  ? 04/28/22 0910   ?BP: 111/68  ?Pulse: (!) 110  ?Weight: 196 lb (88.9 kg)  ?Body mass index is 33.64 kg/m?. ?  ?     Physical Examination:  ? General appearance: Well appearing, and in no distress ? Mental status: Alert, oriented to person, place, and time ? Skin: Warm & dry ? Cardiovascular: Normal heart rate noted ? Respiratory: Normal respiratory effort, no distress ? Abdomen: Soft, gravid, nontender ? Pelvic: Cervical exam deferred        ? Extremities: Edema: Trace ? ?Fetal Status: Fetal Heart Rate (bpm): 150 Fundal Height: 28 cm Movement: Present   ? ?Chaperone: N/A   ?No results found for this or any previous visit (from the past 24 hour(s)).  ?Assessment & Plan:  ?1) Low-risk pregnancy G1P0000 at [redacted]w[redacted]d with an Estimated Date of Delivery: 07/22/22  ?  ?Meds: No orders of the defined types were placed in this encounter. ? ?Labs/procedures today: PN2 and declined tdap, wants to think about it ? ?Plan:  Continue routine obstetrical care  ?Next visit: prefers in person   ? ?Reviewed: Preterm labor symptoms and general obstetric precautions including but not  limited to vaginal bleeding, contractions, leaking of fluid and fetal movement were reviewed in detail with the patient.  All questions were answered. Does have home bp cuff. Office bp cuff given: not applicable. Check bp weekly, let us know if consistently >140 and/or >90. ? ?Follow-up: Return in about 15 days (around 05/13/2022) for [place], [place], in person. ? ?No future appointments. ? ?No orders of the defined types were placed in this encounter. ? ?Roma Schanz CNM, WHNP-BC ?04/28/2022 ?9:22 AM  ?

## 2022-04-28 NOTE — Patient Instructions (Signed)
Heidi Shields, thank you for choosing our office today! We appreciate the opportunity to meet your healthcare needs. You may receive a short survey by mail, e-mail, or through [employer]. If you are happy with your care we would appreciate if you could take just a few minutes to complete the survey questions. We read all of your comments and take your feedback very seriously. Thank you again for choosing our office.  ?Center for [employer] Team at [facility] ? ?Women's & Children's Center at [facility] ?([address] [place], [place] [address]) ?Entrance C, located off of [address] ?Free 24/7 valet parking  ? ?CLASSES: Go to [email or web address] to register for classes (childbirth, breastfeeding, waterbirth, infant CPR, daddy bootcamp, etc.) ? ?Call the office [phone]) or go to [facility] if: ?You begin to have strong, frequent contractions ?Your water breaks.  Sometimes it is a big gush of fluid, sometimes it is just a trickle that keeps getting your panties wet or running down your legs ?You have vaginal bleeding.  It is normal to have a small amount of spotting if your cervix was checked.  ?You don't feel your baby moving like normal.  If you don't, get you something to eat and drink and lay down and focus on feeling your baby move.   If your baby is still not moving like normal, you should call the office or go to [facility]. ? ?Call the office [phone]) or go to [facility] hospital for these signs of pre-eclampsia: ?Severe headache that does not go away with Tylenol ?Visual changes- seeing spots, double, blurred vision ?Pain under your right breast or upper abdomen that does not go away with Tums or heartburn medicine ?Nausea and/or vomiting ?Severe swelling in your hands, feet, and face  ? ?Tdap Vaccine ?It is recommended that you get the Tdap vaccine during the third trimester of EACH pregnancy to help protect your baby from getting pertussis (whooping cough) ?27-36 weeks is the BEST time to do  this so that you can pass the protection on to your baby. During pregnancy is better than after pregnancy, but if you are unable to get it during pregnancy it will be offered at the hospital.  ?You can get this vaccine with [name], at the health department, your family doctor, or some local pharmacies ?Everyone who will be around your baby should also be up-to-date on their vaccines before the baby comes. Adults (who are not pregnant) only need 1 dose of Tdap during adulthood.  ? ?[place] Pediatricians/Family Doctors ?[facility]): [address], [phone]           ?[facility]: [address], [phone]                ?[facility]): [address], [phone] (call to ask if accepting patients) ?[facility] Department: [address], [place], [phone]   ? ?[place] Pediatricians/Family Doctors ?[facility]): [address], [phone] ?[facility]: [address] [place], [phone] ?[facility]: [address], [phone] ? ?[employer] Family Doctors  ?[facility]): [phone] ?[facility]: [address], [phone]  ? ?[facility] Family Doctors ?[facility]: [address], [phone] ? ?[facility]  ?[facility]: [phone], [phone] ? ?Home Blood Pressure Monitoring for Patients  ? ?Your provider has recommended that you check your  blood pressure (BP) at least once a week at home. If you do not have a blood pressure cuff at home, one will be provided for you. Contact your provider if you have not received your monitor within 1 week.  ? ?Helpful Tips for Accurate Home Blood Pressure Checks  ?Don't smoke, exercise, or drink caffeine 30 minutes before checking your BP ?Use the restroom before checking your BP (a full bladder can raise your  pressure) ?Relax in a comfortable upright chair ?Feet on the ground ?Left arm resting comfortably on a flat surface at the level of your heart ?Legs uncrossed ?Back supported ?Sit quietly and don't talk ?Place the cuff on your bare arm ?Adjust snuggly, so that only two fingertips can fit between your skin and the top of the cuff ?Check 2 readings separated by at least one minute ?Keep a log of your BP readings ?For a visual, please reference this diagram: http://ccnc.care/bpdiagram ? ?Provider Name: [facility]     Phone: [phone] ? ?Zone 1: ALL CLEAR  ?Continue to monitor your symptoms:  ?BP reading is less than 140 (top number) or less than 90 (bottom number)  ?No right upper stomach pain ?No headaches or seeing spots ?No feeling nauseated or throwing up ?No swelling in face and hands ? ?Zone 2: CAUTION ?Call your doctor's office for any of the following:  ?BP reading is greater than 140 (top number) or greater than 90 (bottom number)  ?Stomach pain under your ribs in the middle or right side ?Headaches or seeing spots ?Feeling nauseated or throwing up ?Swelling in face and hands ? ?Zone 3: EMERGENCY  ?Seek immediate medical care if you have any of the following:  ?BP reading is greater than160 (top number) or greater than 110 (bottom number) ?Severe headaches not improving with Tylenol ?Serious difficulty catching your breath ?Any worsening symptoms from Zone 2  ? ?Third Trimester of Pregnancy ?The third trimester is from week 29 through week 42, months 7 through 9. The third trimester is a time when the fetus is growing rapidly. At the end of the ninth month, the fetus is about 20 inches in length and weighs 6-10 pounds.  ?BODY CHANGES ?Your body goes through many changes during pregnancy. The changes vary from woman to woman.  ?Your weight will continue to increase. You can expect to gain 25-35 pounds (11-16 kg) by the end of the pregnancy. ?You may begin to get stretch marks on your hips, abdomen,  and breasts. ?You may urinate more often because the fetus is moving lower into your pelvis and pressing on your bladder. ?You may develop or continue to have heartburn as a result of your pregnancy. ?You may develop constipation because certain hormones are causing the muscles that push waste through your intestines to slow down. ?You may develop hemorrhoids or swollen, bulging veins (varicose veins). ?You may have pelvic pain because of the weight gain and pregnancy hormones relaxing your joints between the bones in your pelvis. Backaches may result from overexertion of the muscles supporting your posture. ?You may have changes in your hair. These can include thickening of your hair, rapid growth, and changes in texture. Some women also have hair loss during or after pregnancy, or hair that feels dry or thin. Your hair will most likely return to normal after your baby is born. ?Your breasts will continue to grow and be tender. A yellow discharge may leak from your breasts called colostrum. ?Your belly button may stick out. ?You may  feel short of breath because of your expanding uterus. ?You may notice the fetus "dropping," or moving lower in your abdomen. ?You may have a bloody mucus discharge. This usually occurs a few days to a week before labor begins. ?Your cervix becomes thin and soft (effaced) near your due date. ?WHAT TO EXPECT AT [place]  ?You will have prenatal exams every 2 weeks until week 36. Then, you will have weekly prenatal exams. During a routine prenatal visit: ?You will be weighed to make sure you and the fetus are growing normally. ?Your blood pressure is taken. ?Your abdomen will be measured to track your baby's growth. ?The fetal heartbeat will be listened to. ?Any test results from the previous visit will be discussed. ?You may have a cervical check near your due date to see if you have effaced. ?At around 36 weeks, your caregiver will check your cervix. At the same time, your  caregiver will also perform a test on the secretions of the vaginal tissue. This test is to determine if a type of bacteria, Group B streptococcus, is present. Your caregiver will explain this further. ?Your

## 2022-04-29 ENCOUNTER — Encounter: Payer: Self-pay | Admitting: Women's Health

## 2022-04-29 LAB — CBC
Hematocrit: 35.3 % (ref 34.0–46.6)
Hemoglobin: 12 g/dL (ref 11.1–15.9)
MCH: 29.9 pg (ref 26.6–33.0)
MCHC: 34 g/dL (ref 31.5–35.7)
MCV: 88 fL (ref 79–97)
Platelets: 219 10*3/uL (ref 150–450)
RBC: 4.02 x10E6/uL (ref 3.77–5.28)
RDW: 11.8 % (ref 11.7–15.4)
WBC: 15.7 10*3/uL — ABNORMAL HIGH (ref 3.4–10.8)

## 2022-04-29 LAB — HIV ANTIBODY (ROUTINE TESTING W REFLEX): HIV Screen 4th Generation wRfx: NONREACTIVE

## 2022-04-29 LAB — RPR: RPR Ser Ql: NONREACTIVE

## 2022-04-29 LAB — GLUCOSE TOLERANCE, 2 HOURS W/ 1HR
Glucose, 1 hour: 137 mg/dL (ref 70–179)
Glucose, 2 hour: 96 mg/dL (ref 70–152)
Glucose, Fasting: 75 mg/dL (ref 70–91)

## 2022-04-29 LAB — ANTIBODY SCREEN: Antibody Screen: NEGATIVE

## 2022-05-14 ENCOUNTER — Encounter: Payer: 59 | Admitting: Advanced Practice Midwife

## 2022-05-14 ENCOUNTER — Encounter: Payer: Self-pay | Admitting: Advanced Practice Midwife

## 2022-05-14 ENCOUNTER — Ambulatory Visit (INDEPENDENT_AMBULATORY_CARE_PROVIDER_SITE_OTHER): Payer: 59 | Admitting: Advanced Practice Midwife

## 2022-05-14 VITALS — BP 119/83 | HR 115 | Wt 203.5 lb

## 2022-05-14 DIAGNOSIS — Z3A30 30 weeks gestation of pregnancy: Secondary | ICD-10-CM

## 2022-05-14 DIAGNOSIS — Z3403 Encounter for supervision of normal first pregnancy, third trimester: Secondary | ICD-10-CM

## 2022-05-14 DIAGNOSIS — O3663X0 Maternal care for excessive fetal growth, third trimester, not applicable or unspecified: Secondary | ICD-10-CM

## 2022-05-14 NOTE — Patient Instructions (Signed)
Heidi Shields, thank you for choosing our office today! We appreciate the opportunity to meet your healthcare needs. You may receive a short survey by mail, e-mail, or through MyChart. If you are happy with your care we would appreciate if you could take just a few minutes to complete the survey questions. We read all of your comments and take your feedback very seriously. Thank you again for choosing our office.  [facility] ([address] [place], [place] [address]) Entrance C, located off of [address] Free 24/7 valet parking   CLASSES: Go to [email or web address] to register for classes (childbirth, breastfeeding, waterbirth, infant CPR, daddy bootcamp, etc.)  Call the office ([phone]) or go to [facility] if: You begin to have strong, frequent contractions Your water breaks.  Sometimes it is a big gush of fluid, sometimes it is just a trickle that keeps getting your panties wet or running down your legs You have vaginal bleeding.  It is normal to have a small amount of spotting if your cervix was checked.  You don't feel your baby moving like normal.  If you don't, get you something to eat and drink and lay down and focus on feeling your baby move.   If your baby is still not moving like normal, you should call the office or go to [facility].  Call the office ([phone]) or go to [facility] for these signs of pre-eclampsia: Severe headache that does not go away with Tylenol Visual changes- seeing spots, double, blurred vision Pain under your right breast or upper abdomen that does not go away with Tums or heartburn medicine Nausea and/or vomiting Severe swelling in your hands, feet, and face   Tdap Vaccine It is recommended that you get the Tdap vaccine during the third trimester of EACH pregnancy to help protect your baby from getting pertussis (whooping cough) 27-36 weeks is the BEST time to do  this so that you can pass the protection on to your baby. During pregnancy is better than after pregnancy, but if you are unable to get it during pregnancy it will be offered at the hospital.  You can get this vaccine with us, at the health department, your family doctor, or some local pharmacies Everyone who will be around your baby should also be up-to-date on their vaccines before the baby comes. Adults (who are not pregnant) only need 1 dose of Tdap during adulthood.   [place] Pediatricians/Family Doctors [facility] ([facility]): [address], [phone]           [facility]: [address], [phone]                [facility] ([facility]): [address], [phone] (call to ask if accepting patients) [facility]: [address], [place], [phone]    [place] Pediatricians/Family Doctors [facility] ([facility]): [address], [phone] [facility]: [address], [phone] [facility]: [address], [phone]  [place] [facility]  [facility] ([facility]): [phone] [facility]: [address], [phone]   [place] Family [facility]: [address], [phone]  [place] [facility]  [facility]: [address], [phone]  Home Blood Pressure Monitoring for Patients   Your provider has recommended that you check your   blood pressure (BP) at least once a week at home. If you do not have a blood pressure cuff at home, one will be provided for you. Contact your provider if you have not received your monitor within 1 week.   Helpful Tips for Accurate Home Blood Pressure Checks  Don't smoke, exercise, or drink caffeine 30 minutes before checking your BP Use the restroom before checking your BP (a full bladder can raise your  pressure) Relax in a comfortable upright chair Feet on the ground Left arm resting comfortably on a flat surface at the level of your heart Legs uncrossed Back supported Sit quietly and don't talk Place the cuff on your bare arm Adjust snuggly, so that only two fingertips can fit between your skin and the top of the cuff Check 2 readings separated by at least one minute Keep a log of your BP readings For a visual, please reference this diagram: http://ccnc.care/bpdiagram  Provider Name: [facility]     Phone: [phone]  Zone 1: ALL CLEAR  Continue to monitor your symptoms:  BP reading is less than 140 (top number) or less than 90 (bottom number)  No right upper stomach pain No headaches or seeing spots No feeling nauseated or throwing up No swelling in face and hands  Zone 2: CAUTION Call your doctor's office for any of the following:  BP reading is greater than 140 (top number) or greater than 90 (bottom number)  Stomach pain under your ribs in the middle or right side Headaches or seeing spots Feeling nauseated or throwing up Swelling in face and hands  Zone 3: EMERGENCY  Seek immediate medical care if you have any of the following:  BP reading is greater than160 (top number) or greater than 110 (bottom number) Severe headaches not improving with Tylenol Serious difficulty catching your breath Any worsening symptoms from Zone 2   Third Trimester of Pregnancy The third trimester is from week 29 through week 42, months 7 through 9. The third trimester is a time when the fetus is growing rapidly. At the end of the ninth month, the fetus is about 20 inches in length and weighs 6-10 pounds.  BODY CHANGES Your body goes through many changes during pregnancy. The changes vary from woman to woman.  Your weight will continue to increase. You can expect to gain 25-35 pounds (11-16 kg) by the end of the pregnancy. You may begin to get stretch marks on your hips, abdomen,  and breasts. You may urinate more often because the fetus is moving lower into your pelvis and pressing on your bladder. You may develop or continue to have heartburn as a result of your pregnancy. You may develop constipation because certain hormones are causing the muscles that push waste through your intestines to slow down. You may develop hemorrhoids or swollen, bulging veins (varicose veins). You may have pelvic pain because of the weight gain and pregnancy hormones relaxing your joints between the bones in your pelvis. Backaches may result from overexertion of the muscles supporting your posture. You may have changes in your hair. These can include thickening of your hair, rapid growth, and changes in texture. Some women also have hair loss during or after pregnancy, or hair that feels dry or thin. Your hair will most likely return to normal after your baby is born. Your breasts will continue to grow and be tender. A yellow discharge may leak from your breasts called colostrum. Your belly button may stick out. You may   feel short of breath because of your expanding uterus. You may notice the fetus "dropping," or moving lower in your abdomen. You may have a bloody mucus discharge. This usually occurs a few days to a week before labor begins. Your cervix becomes thin and soft (effaced) near your due date. WHAT TO EXPECT AT YOUR PRENATAL EXAMS  You will have prenatal exams every 2 weeks until week 36. Then, you will have weekly prenatal exams. During a routine prenatal visit: You will be weighed to make sure you and the fetus are growing normally. Your blood pressure is taken. Your abdomen will be measured to track your baby's growth. The fetal heartbeat will be listened to. Any test results from the previous visit will be discussed. You may have a cervical check near your due date to see if you have effaced. At around 36 weeks, your caregiver will check your cervix. At the same time, your  caregiver will also perform a test on the secretions of the vaginal tissue. This test is to determine if a type of bacteria, Group B streptococcus, is present. Your caregiver will explain this further. Your caregiver may ask you: What your birth plan is. How you are feeling. If you are feeling the baby move. If you have had any abnormal symptoms, such as leaking fluid, bleeding, severe headaches, or abdominal cramping. If you have any questions. Other tests or screenings that may be performed during your third trimester include: Blood tests that check for low iron levels (anemia). Fetal testing to check the health, activity level, and growth of the fetus. Testing is done if you have certain medical conditions or if there are problems during the pregnancy. FALSE LABOR You may feel small, irregular contractions that eventually go away. These are called Braxton Hicks contractions, or false labor. Contractions may last for hours, days, or even weeks before true labor sets in. If contractions come at regular intervals, intensify, or become painful, it is best to be seen by your caregiver.  SIGNS OF LABOR  Menstrual-like cramps. Contractions that are 5 minutes apart or less. Contractions that start on the top of the uterus and spread down to the lower abdomen and back. A sense of increased pelvic pressure or back pain. A watery or bloody mucus discharge that comes from the vagina. If you have any of these signs before the 37th week of pregnancy, call your caregiver right away. You need to go to the hospital to get checked immediately. HOME CARE INSTRUCTIONS  Avoid all smoking, herbs, alcohol, and unprescribed drugs. These chemicals affect the formation and growth of the baby. Follow your caregiver's instructions regarding medicine use. There are medicines that are either safe or unsafe to take during pregnancy. Exercise only as directed by your caregiver. Experiencing uterine cramps is a good sign to  stop exercising. Continue to eat regular, healthy meals. Wear a good support bra for breast tenderness. Do not use hot tubs, steam rooms, or saunas. Wear your seat belt at all times when driving. Avoid raw meat, uncooked cheese, cat litter boxes, and soil used by cats. These carry germs that can cause birth defects in the baby. Take your prenatal vitamins. Try taking a stool softener (if your caregiver approves) if you develop constipation. Eat more high-fiber foods, such as fresh vegetables or fruit and whole grains. Drink plenty of fluids to keep your urine clear or pale yellow. Take warm sitz baths to soothe any pain or discomfort caused by hemorrhoids. Use hemorrhoid cream if   your caregiver approves. If you develop varicose veins, wear support hose. Elevate your feet for 15 minutes, 3-4 times a day. Limit salt in your diet. Avoid heavy lifting, wear low heal shoes, and practice good posture. Rest a lot with your legs elevated if you have leg cramps or low back pain. Visit your dentist if you have not gone during your pregnancy. Use a soft toothbrush to brush your teeth and be gentle when you floss. A sexual relationship may be continued unless your caregiver directs you otherwise. Do not travel far distances unless it is absolutely necessary and only with the approval of your caregiver. Take prenatal classes to understand, practice, and ask questions about the labor and delivery. Make a trial run to the hospital. Pack your hospital bag. Prepare the baby's nursery. Continue to go to all your prenatal visits as directed by your caregiver. SEEK MEDICAL CARE IF: You are unsure if you are in labor or if your water has broken. You have dizziness. You have mild pelvic cramps, pelvic pressure, or nagging pain in your abdominal area. You have persistent nausea, vomiting, or diarrhea. You have a bad smelling vaginal discharge. You have pain with urination. SEEK IMMEDIATE MEDICAL CARE IF:  You  have a fever. You are leaking fluid from your vagina. You have spotting or bleeding from your vagina. You have severe abdominal cramping or pain. You have rapid weight loss or gain. You have shortness of breath with chest pain. You notice sudden or extreme swelling of your face, hands, ankles, feet, or legs. You have not felt your baby move in over an hour. You have severe headaches that do not go away with medicine. You have vision changes. Document Released: 11/25/2001 Document Revised: 12/06/2013 Document Reviewed: 02/01/2013 ExitCare Patient Information 2015 [employer]. This information is not intended to replace advice given to you by your health care provider. Make sure you discuss any questions you have with your health care provider.       

## 2022-05-14 NOTE — Progress Notes (Signed)
   LOW-RISK PREGNANCY VISIT Patient name: Heidi Shields MRN [id number]  Date of birth: Sep 10, 1996 Chief Complaint:   Routine Prenatal Visit  History of Present Illness:   Heidi Shields is a 26 y.o. G9P0000 female at [redacted]w[redacted]d with an Estimated Date of Delivery: 07/22/22 being seen today for ongoing management of a low-risk pregnancy.  Today she reports  bilat upper arms (esp the R) feel sore/achy x 3d 'like a vaccine' . Contractions: Not present. Vag. Bleeding: None.  Movement: Present. denies leaking of fluid. Review of Systems:   Pertinent items are noted in HPI Denies abnormal vaginal discharge w/ itching/odor/irritation, headaches, visual changes, shortness of breath, chest pain, abdominal pain, severe nausea/vomiting, or problems with urination or bowel movements unless otherwise stated above. Pertinent History Reviewed:  Reviewed past medical,surgical, social, obstetrical and family history.  Reviewed problem list, medications and allergies. Physical Assessment:   Vitals:   05/14/22 1345  BP: 119/83  Pulse: (!) 115  Weight: 203 lb 8 oz (92.3 kg)  Body mass index is 34.93 kg/m.        Physical Examination:   General appearance: Well appearing, and in no distress  Mental status: Alert, oriented to person, place, and time  Skin: Warm & dry  Cardiovascular: Normal heart rate noted  Respiratory: Normal respiratory effort, no distress  Abdomen: Soft, gravid, nontender  Pelvic: Cervical exam deferred         Extremities: Edema: Trace  Fetal Status: Fetal Heart Rate (bpm): 150 Fundal Height: 30 cm Movement: Present    No results found for this or any previous visit (from the past 24 hour(s)).  Assessment & Plan:  1) Low-risk pregnancy G1P0000 at [redacted]w[redacted]d with an Estimated Date of Delivery: 07/22/22   2) Sore upper arms R>L  3) EFW 99th% at anatomy scan, will schedule EFW for 36wks   Meds: No orders of the defined types were placed in this encounter.  Labs/procedures today:  none  Plan:  Continue routine obstetrical care   Reviewed: Preterm labor symptoms and general obstetric precautions including but not limited to vaginal bleeding, contractions, leaking of fluid and fetal movement were reviewed in detail with the patient.  All questions were answered. Has home bp cuff.  Check bp weekly, let us know if >140/90.   Follow-up: Return in about 2 weeks (around 05/28/2022) for As scheduled, but add an EFW u/s to the 36wk visit.  Orders Placed This Encounter  Procedures   US OB Follow Up   [facility] 05/14/2022 2:05 PM

## 2022-05-27 ENCOUNTER — Ambulatory Visit (INDEPENDENT_AMBULATORY_CARE_PROVIDER_SITE_OTHER): Payer: 59 | Admitting: Women's Health

## 2022-05-27 ENCOUNTER — Encounter: Payer: Self-pay | Admitting: Women's Health

## 2022-05-27 VITALS — BP 125/70 | HR 87 | Wt 209.6 lb

## 2022-05-27 DIAGNOSIS — Z3403 Encounter for supervision of normal first pregnancy, third trimester: Secondary | ICD-10-CM

## 2022-05-27 NOTE — Progress Notes (Signed)
LOW-RISK PREGNANCY VISIT Patient name: Heidi Shields MRN [id number]  Date of birth: 07/16/1996 Chief Complaint:   Routine Prenatal Visit  History of Present Illness:   Heidi Shields is a 26 y.o. G86P0000 female at [redacted]w[redacted]d with an Estimated Date of Delivery: 07/22/22 being seen today for ongoing management of a low-risk pregnancy.   Today she reports  some contractions at night, pressure during day . Contractions: Not present. Vag. Bleeding: None.  Movement: Present. denies leaking of fluid.     04/28/2022    9:11 AM 01/02/2022    3:05 PM 02/15/2021    8:34 AM 07/24/2020    1:42 PM 08/20/2018   10:04 AM  Depression screen PHQ 2/9  Decreased Interest 0 0 0 0 0  Down, Depressed, Hopeless 0 0 0 0 0  PHQ - 2 Score 0 0 0 0 0  Altered sleeping 0 0  0   Tired, decreased energy 0 0  0   Change in appetite 0 1  0   Feeling bad or failure about yourself  0 0  0   Trouble concentrating 0 0  0   Moving slowly or fidgety/restless 0 0  0   Suicidal thoughts 0 0  0   PHQ-9 Score 0 1  0   Difficult doing work/chores    Not difficult at all         04/28/2022    9:12 AM 01/02/2022    3:05 PM 07/24/2020    1:43 PM  GAD 7 : Generalized Anxiety Score  Nervous, Anxious, on Edge 0 0 0  Control/stop worrying 0 0 0  Worry too much - different things 0 0 0  Trouble relaxing 0 0 0  Restless 0 0 0  Easily annoyed or irritable 0 0 0  Afraid - awful might happen 0 0 0  Total GAD 7 Score 0 0 0  Anxiety Difficulty   Not difficult at all      Review of Systems:   Pertinent items are noted in HPI Denies abnormal vaginal discharge w/ itching/odor/irritation, headaches, visual changes, shortness of breath, chest pain, abdominal pain, severe nausea/vomiting, or problems with urination or bowel movements unless otherwise stated above. Pertinent History Reviewed:  Reviewed past medical,surgical, social, obstetrical and family history.  Reviewed problem list, medications and allergies. Physical Assessment:    Vitals:   05/27/22 1338  BP: 125/70  Pulse: 87  Weight: 209 lb 9.6 oz (95.1 kg)  Body mass index is 35.98 kg/m.        Physical Examination:   General appearance: Well appearing, and in no distress  Mental status: Alert, oriented to person, place, and time  Skin: Warm & dry  Cardiovascular: Normal heart rate noted  Respiratory: Normal respiratory effort, no distress  Abdomen: Soft, gravid, nontender  Pelvic: Cervical exam deferred         Extremities: Edema: Trace  Fetal Status: Fetal Heart Rate (bpm): 140 Fundal Height: 32 cm Movement: Present    Chaperone: N/A   No results found for this or any previous visit (from the past 24 hour(s)).  Assessment & Plan:  1) Low-risk pregnancy G1P0000 at [redacted]w[redacted]d with an Estimated Date of Delivery: 07/22/22    Meds: No orders of the defined types were placed in this encounter.  Labs/procedures today: none  Plan:  Continue routine obstetrical care  Next visit: prefers in person    Reviewed: Preterm labor symptoms and general obstetric precautions including but not limited  to vaginal bleeding, contractions, leaking of fluid and fetal movement were reviewed in detail with the patient.  All questions were answered. Does have home bp cuff. Office bp cuff given: not applicable. Check bp weekly, let us know if consistently >140 and/or >90.  Follow-up: Return for As scheduled.  Future Appointments  Date Time Provider Department Center  06/10/2022  1:50 PM Cheral Marker, CNM [facility]  06/24/2022  3:00 PM [facility] [name] [facility] None  06/24/2022  4:10 PM Cheral Marker, CNM [facility]    No orders of the defined types were placed in this encounter.  Cheral Marker CNM, [facility] 05/27/2022 1:47 PM

## 2022-05-27 NOTE — Patient Instructions (Signed)
Heidi Shields, thank you for choosing our office today! We appreciate the opportunity to meet your healthcare needs. You may receive a short survey by mail, e-mail, or through MyChart. If you are happy with your care we would appreciate if you could take just a few minutes to complete the survey questions. We read all of your comments and take your feedback very seriously. Thank you again for choosing our office.  [facility]  [facility] ([address] [place], [place] [address]) Entrance C, located off of [address] Free 24/7 valet parking   CLASSES: Go to [email or web address] to register for classes (childbirth, breastfeeding, waterbirth, infant CPR, daddy bootcamp, etc.)  Call the office ([phone]) or go to [facility] if: You begin to have strong, frequent contractions Your water breaks.  Sometimes it is a big gush of fluid, sometimes it is just a trickle that keeps getting your panties wet or running down your legs You have vaginal bleeding.  It is normal to have a small amount of spotting if your cervix was checked.  You don't feel your baby moving like normal.  If you don't, get you something to eat and drink and lay down and focus on feeling your baby move.   If your baby is still not moving like normal, you should call the office or go to [facility].  Call the office ([phone]) or go to [facility] for these signs of pre-eclampsia: Severe headache that does not go away with Tylenol Visual changes- seeing spots, double, blurred vision Pain under your right breast or upper abdomen that does not go away with Tums or heartburn medicine Nausea and/or vomiting Severe swelling in your hands, feet, and face   Tdap Vaccine It is recommended that you get the Tdap vaccine during the third trimester of EACH pregnancy to help protect your baby from getting pertussis (whooping cough) 27-36 weeks is the BEST time to do  this so that you can pass the protection on to your baby. During pregnancy is better than after pregnancy, but if you are unable to get it during pregnancy it will be offered at the hospital.  You can get this vaccine with us, at the health department, your family doctor, or some local pharmacies Everyone who will be around your baby should also be up-to-date on their vaccines before the baby comes. Adults (who are not pregnant) only need 1 dose of Tdap during adulthood.   [place] Pediatricians/Family Doctors [facility] ([facility]): [address], [phone]           [facility]: [address], [phone]                [facility] ([facility]): [address], [phone] (call to ask if accepting patients) [facility]: [address], [place], [phone]    [place] Pediatricians/Family Doctors [facility] ([facility]): [address], [phone] [facility]: [address], [phone] [facility]: [address], [phone]  [place] [facility]  [facility] ([facility]): [phone] [facility]: [address], [phone]   [place] Family Doctors [facility]: [address], [phone]  [place] [facility]  [facility]: [address], [phone]  Home Blood Pressure Monitoring for Patients   Your provider has recommended that you check your   blood pressure (BP) at least once a week at home. If you do not have a blood pressure cuff at home, one will be provided for you. Contact your provider if you have not received your monitor within 1 week.   Helpful Tips for Accurate Home Blood Pressure Checks  Don't smoke, exercise, or drink caffeine 30 minutes before checking your BP Use the restroom before checking your BP (a full bladder can raise your  pressure) Relax in a comfortable upright chair Feet on the ground Left arm resting comfortably on a flat surface at the level of your heart Legs uncrossed Back supported Sit quietly and don't talk Place the cuff on your bare arm Adjust snuggly, so that only two fingertips can fit between your skin and the top of the cuff Check 2 readings separated by at least one minute Keep a log of your BP readings For a visual, please reference this diagram: http://ccnc.care/bpdiagram  Provider Name: [facility]     Phone: [phone]  Zone 1: ALL CLEAR  Continue to monitor your symptoms:  BP reading is less than 140 (top number) or less than 90 (bottom number)  No right upper stomach pain No headaches or seeing spots No feeling nauseated or throwing up No swelling in face and hands  Zone 2: CAUTION Call your doctor's office for any of the following:  BP reading is greater than 140 (top number) or greater than 90 (bottom number)  Stomach pain under your ribs in the middle or right side Headaches or seeing spots Feeling nauseated or throwing up Swelling in face and hands  Zone 3: EMERGENCY  Seek immediate medical care if you have any of the following:  BP reading is greater than160 (top number) or greater than 110 (bottom number) Severe headaches not improving with Tylenol Serious difficulty catching your breath Any worsening symptoms from Zone 2  Preterm Labor and Birth Information  The normal length of a pregnancy is 39-41 weeks. Preterm labor is when labor starts before 37 completed weeks of pregnancy. What are the risk factors for preterm labor? Preterm labor is more likely to occur in women who: Have certain infections during pregnancy such as a bladder infection, sexually transmitted infection, or infection inside the uterus (chorioamnionitis). Have a shorter-than-normal cervix. Have gone into preterm labor before. Have had surgery on their cervix. Are younger than age 17  or older than age 35. Are African American. Are pregnant with twins or multiple babies (multiple gestation). Take street drugs or smoke while pregnant. Do not gain enough weight while pregnant. Became pregnant shortly after having been pregnant. What are the symptoms of preterm labor? Symptoms of preterm labor include: Cramps similar to those that can happen during a menstrual period. The cramps may happen with diarrhea. Pain in the abdomen or lower back. Regular uterine contractions that may feel like tightening of the abdomen. A feeling of increased pressure in the pelvis. Increased watery or bloody mucus discharge from the vagina. Water breaking (ruptured amniotic sac). Why is it important to recognize signs of preterm labor? It is important to recognize signs of preterm labor because babies who are born prematurely may not be fully developed. This can put them at an increased risk for: Long-term (chronic) heart and lung problems. Difficulty immediately after birth with regulating body systems, including blood sugar, body temperature, heart rate, and breathing rate. Bleeding in the brain. Cerebral palsy. Learning difficulties. Death. These risks are highest for babies who are born before 34 weeks   of pregnancy. How is preterm labor treated? Treatment depends on the length of your pregnancy, your condition, and the health of your baby. It may involve: Having a stitch (suture) placed in your cervix to prevent your cervix from opening too early (cerclage). Taking or being given medicines, such as: Hormone medicines. These may be given early in pregnancy to help support the pregnancy. Medicine to stop contractions. Medicines to help mature the baby's lungs. These may be prescribed if the risk of delivery is high. Medicines to prevent your baby from developing cerebral palsy. If the labor happens before 34 weeks of pregnancy, you may need to stay in the hospital. What should I do if I  think I am in preterm labor? If you think that you are going into preterm labor, call your health care provider right away. How can I prevent preterm labor in future pregnancies? To increase your chance of having a full-term pregnancy: Do not use any tobacco products, such as cigarettes, chewing tobacco, and e-cigarettes. If you need help quitting, ask your health care provider. Do not use street drugs or medicines that have not been prescribed to you during your pregnancy. Talk with your health care provider before taking any herbal supplements, even if you have been taking them regularly. Make sure you gain a healthy amount of weight during your pregnancy. Watch for infection. If you think that you might have an infection, get it checked right away. Make sure to tell your health care provider if you have gone into preterm labor before. This information is not intended to replace advice given to you by your health care provider. Make sure you discuss any questions you have with your health care provider. Document Revised: 03/25/2019 Document Reviewed: 04/23/2016 Elsevier Patient Education  2020 Elsevier Inc.   

## 2022-06-10 ENCOUNTER — Ambulatory Visit (INDEPENDENT_AMBULATORY_CARE_PROVIDER_SITE_OTHER): Payer: 59 | Admitting: Women's Health

## 2022-06-10 ENCOUNTER — Encounter: Payer: Self-pay | Admitting: Women's Health

## 2022-06-10 VITALS — BP 109/69 | HR 90 | Wt 213.4 lb

## 2022-06-10 DIAGNOSIS — O3663X0 Maternal care for excessive fetal growth, third trimester, not applicable or unspecified: Secondary | ICD-10-CM

## 2022-06-10 DIAGNOSIS — Z3403 Encounter for supervision of normal first pregnancy, third trimester: Secondary | ICD-10-CM

## 2022-06-10 MED ORDER — PANTOPRAZOLE SODIUM 20 MG PO TBEC: 20.0000 mg | DELAYED_RELEASE_TABLET | Freq: Every day | ORAL | 1 refills | Status: DC

## 2022-06-11 ENCOUNTER — Encounter: Payer: Self-pay | Admitting: Women's Health

## 2022-06-12 DIAGNOSIS — Z029 Encounter for administrative examinations, unspecified: Secondary | ICD-10-CM

## 2022-06-17 ENCOUNTER — Encounter: Payer: Self-pay | Admitting: Women's Health

## 2022-06-18 ENCOUNTER — Encounter: Payer: Self-pay | Admitting: *Deleted

## 2022-06-24 ENCOUNTER — Ambulatory Visit (INDEPENDENT_AMBULATORY_CARE_PROVIDER_SITE_OTHER): Payer: 59

## 2022-06-24 ENCOUNTER — Ambulatory Visit (INDEPENDENT_AMBULATORY_CARE_PROVIDER_SITE_OTHER): Payer: 59 | Admitting: Women's Health

## 2022-06-24 ENCOUNTER — Other Ambulatory Visit (HOSPITAL_COMMUNITY)
Admission: RE | Admit: 2022-06-24 | Discharge: 2022-06-24 | Disposition: A | Discharge status: Home / Self Care | Payer: 59 | Source: Ambulatory Visit | Attending: Women's Health | Admitting: Women's Health

## 2022-06-24 ENCOUNTER — Encounter: Payer: Self-pay | Admitting: Women's Health

## 2022-06-24 VITALS — BP 127/81 | HR 87 | Wt 223.0 lb

## 2022-06-24 DIAGNOSIS — Z3A36 36 weeks gestation of pregnancy: Secondary | ICD-10-CM | POA: Diagnosis not present

## 2022-06-24 DIAGNOSIS — R030 Elevated blood-pressure reading, without diagnosis of hypertension: Secondary | ICD-10-CM | POA: Diagnosis not present

## 2022-06-24 DIAGNOSIS — O368130 Decreased fetal movements, third trimester, not applicable or unspecified: Secondary | ICD-10-CM

## 2022-06-24 DIAGNOSIS — Z3403 Encounter for supervision of normal first pregnancy, third trimester: Secondary | ICD-10-CM | POA: Insufficient documentation

## 2022-06-24 DIAGNOSIS — O3663X0 Maternal care for excessive fetal growth, third trimester, not applicable or unspecified: Secondary | ICD-10-CM | POA: Diagnosis not present

## 2022-06-24 LAB — POCT URINALYSIS DIPSTICK OB
Blood, UA: NEGATIVE
Glucose, UA: NEGATIVE
Ketones, UA: NEGATIVE
Nitrite, UA: NEGATIVE

## 2022-06-24 NOTE — Patient Instructions (Signed)
Jenne, thank you for choosing our office today! We appreciate the opportunity to meet your healthcare needs. You may receive a short survey by mail, e-mail, or through MyChart. If you are happy with your care we would appreciate if you could take just a few minutes to complete the survey questions. We read all of your comments and take your feedback very seriously. Thank you again for choosing our office.  [facility]  [facility] ([address] [place], [place] [address]) Entrance C, located off of [address] Free 24/7 valet parking   CLASSES: Go to [email or web address] to register for classes (childbirth, breastfeeding, waterbirth, infant CPR, daddy bootcamp, etc.)  Call the office ([phone]) or go to [facility] if: You begin to have strong, frequent contractions Your water breaks.  Sometimes it is a big gush of fluid, sometimes it is just a trickle that keeps getting your panties wet or running down your legs You have vaginal bleeding.  It is normal to have a small amount of spotting if your cervix was checked.  You don't feel your baby moving like normal.  If you don't, get you something to eat and drink and lay down and focus on feeling your baby move.   If your baby is still not moving like normal, you should call the office or go to [facility].  Call the office ([phone]) or [facility] for these signs of pre-eclampsia: Severe headache that does not go away with Tylenol Visual changes- seeing spots, double, blurred vision Pain under your right breast or upper abdomen that does not go away with Tums or heartburn medicine Nausea and/or vomiting Severe swelling in your hands, feet, and face   Tdap Vaccine It is recommended that you get the Tdap vaccine during the third trimester of EACH pregnancy to help protect your baby from getting pertussis (whooping cough) 27-36 weeks is the BEST time to do  this so that you can pass the protection on to your baby. During pregnancy is better than after pregnancy, but if you are unable to get it during pregnancy it will be offered at the hospital.  You can get this vaccine with us, at the health department, your family doctor, or some local pharmacies Everyone who will be around your baby should also be up-to-date on their vaccines before the baby comes. Adults (who are not pregnant) only need 1 dose of Tdap during adulthood.   [place] Pediatricians/Family Doctors [facility] ([facility]): [address], [phone]           [facility]: [address], [phone]                [facility] ([facility]): [address], [phone] (call to ask if accepting patients) [facility]: [address], [place], [phone]    [place] Pediatricians/Family Doctors [facility] ([facility]): [address], [phone] [facility]: [address], [phone] [facility]: [address], [phone]  [place] [facility]  [facility] ([facility]): [phone] [facility]: [address], [phone]   [place] [facility] Doctors [facility]: [address], [phone]  [place] [facility]  [facility]: [address], [phone]  Home Blood Pressure Monitoring for Patients   Your provider has recommended that you check your   blood pressure (BP) at least once a week at home. If you do not have a blood pressure cuff at home, one will be provided for you. Contact your provider if you have not received your monitor within 1 week.   Helpful Tips for Accurate Home Blood Pressure Checks  Don't smoke, exercise, or drink caffeine 30 minutes before checking your BP Use the restroom before checking your BP (a full bladder can raise your  pressure) Relax in a comfortable upright chair Feet on the ground Left arm resting comfortably on a flat surface at the level of your heart Legs uncrossed Back supported Sit quietly and don't talk Place the cuff on your bare arm Adjust snuggly, so that only two fingertips can fit between your skin and the top of the cuff Check 2 readings separated by at least one minute Keep a log of your BP readings For a visual, please reference this diagram: http://ccnc.care/bpdiagram  Provider Name: [facility]     Phone: [phone]  Zone 1: ALL CLEAR  Continue to monitor your symptoms:  BP reading is less than 140 (top number) or less than 90 (bottom number)  No right upper stomach pain No headaches or seeing spots No feeling nauseated or throwing up No swelling in face and hands  Zone 2: CAUTION Call your doctor's office for any of the following:  BP reading is greater than 140 (top number) or greater than 90 (bottom number)  Stomach pain under your ribs in the middle or right side Headaches or seeing spots Feeling nauseated or throwing up Swelling in face and hands  Zone 3: EMERGENCY  Seek immediate medical care if you have any of the following:  BP reading is greater than160 (top number) or greater than 110 (bottom number) Severe headaches not improving with Tylenol Serious difficulty catching your breath Any worsening symptoms from Zone 2  Preterm Labor and Birth Information  The normal length of a pregnancy is 39-41 weeks. Preterm labor is when labor starts before 37 completed weeks of pregnancy. What are the risk factors for preterm labor? Preterm labor is more likely to occur in women who: Have certain infections during pregnancy such as a bladder infection, sexually transmitted infection, or infection inside the uterus (chorioamnionitis). Have a shorter-than-normal cervix. Have gone into preterm labor before. Have had surgery on their cervix. Are younger than age 17  or older than age 35. Are African American. Are pregnant with twins or multiple babies (multiple gestation). Take street drugs or smoke while pregnant. Do not gain enough weight while pregnant. Became pregnant shortly after having been pregnant. What are the symptoms of preterm labor? Symptoms of preterm labor include: Cramps similar to those that can happen during a menstrual period. The cramps may happen with diarrhea. Pain in the abdomen or lower back. Regular uterine contractions that may feel like tightening of the abdomen. A feeling of increased pressure in the pelvis. Increased watery or bloody mucus discharge from the vagina. Water breaking (ruptured amniotic sac). Why is it important to recognize signs of preterm labor? It is important to recognize signs of preterm labor because babies who are born prematurely may not be fully developed. This can put them at an increased risk for: Long-term (chronic) heart and lung problems. Difficulty immediately after birth with regulating body systems, including blood sugar, body temperature, heart rate, and breathing rate. Bleeding in the brain. Cerebral palsy. Learning difficulties. Death. These risks are highest for babies who are born before 34 weeks   of pregnancy. How is preterm labor treated? Treatment depends on the length of your pregnancy, your condition, and the health of your baby. It may involve: Having a stitch (suture) placed in your cervix to prevent your cervix from opening too early (cerclage). Taking or being given medicines, such as: Hormone medicines. These may be given early in pregnancy to help support the pregnancy. Medicine to stop contractions. Medicines to help mature the baby's lungs. These may be prescribed if the risk of delivery is high. Medicines to prevent your baby from developing cerebral palsy. If the labor happens before 34 weeks of pregnancy, you may need to stay in the hospital. What should I do if I  think I am in preterm labor? If you think that you are going into preterm labor, call your health care provider right away. How can I prevent preterm labor in future pregnancies? To increase your chance of having a full-term pregnancy: Do not use any tobacco products, such as cigarettes, chewing tobacco, and e-cigarettes. If you need help quitting, ask your health care provider. Do not use street drugs or medicines that have not been prescribed to you during your pregnancy. Talk with your health care provider before taking any herbal supplements, even if you have been taking them regularly. Make sure you gain a healthy amount of weight during your pregnancy. Watch for infection. If you think that you might have an infection, get it checked right away. Make sure to tell your health care provider if you have gone into preterm labor before. This information is not intended to replace advice given to you by your health care provider. Make sure you discuss any questions you have with your health care provider. Document Revised: 03/25/2019 Document Reviewed: 04/23/2016 Elsevier Patient Education  2020 Elsevier Inc.   

## 2022-06-24 NOTE — Progress Notes (Signed)
[name] 36 wks,cephalic,posterior placenta gr 2,AFI 22.4 cm,right renal pelvic dilation 11 mm,normal left kidney,FHR 157 bpm,EFW 4445 g 99.9%

## 2022-06-24 NOTE — Progress Notes (Signed)
LOW-RISK PREGNANCY VISIT Patient name: Heidi Shields MRN [id number]  Date of birth: 05-22-1996 Chief Complaint:   Routine Prenatal Visit  History of Present Illness:   Heidi Shields is a 26 y.o. G46P0000 female at [redacted]w[redacted]d with an Estimated Date of Delivery: 07/22/22 being seen today for ongoing management of a low-risk pregnancy.   Today she reports decreased fetal movement, eyes, feet/legs swelling. Hasn't checked bp in 2wks. Comes out of work this Friday. Denies ha, visual changes, ruq/epigastric pain, n/v.    Contractions: Not present. Vag. Bleeding: None.  Movement: (!) Decreased. denies leaking of fluid.     04/28/2022    9:11 AM 01/02/2022    3:05 PM 02/15/2021    8:34 AM 07/24/2020    1:42 PM 08/20/2018   10:04 AM  Depression screen PHQ 2/9  Decreased Interest 0 0 0 0 0  Down, Depressed, Hopeless 0 0 0 0 0  PHQ - 2 Score 0 0 0 0 0  Altered sleeping 0 0  0   Tired, decreased energy 0 0  0   Change in appetite 0 1  0   Feeling bad or failure about yourself  0 0  0   Trouble concentrating 0 0  0   Moving slowly or fidgety/restless 0 0  0   Suicidal thoughts 0 0  0   PHQ-9 Score 0 1  0   Difficult doing work/chores    Not difficult at all         04/28/2022    9:12 AM 01/02/2022    3:05 PM 07/24/2020    1:43 PM  GAD 7 : Generalized Anxiety Score  Nervous, Anxious, on Edge 0 0 0  Control/stop worrying 0 0 0  Worry too much - different things 0 0 0  Trouble relaxing 0 0 0  Restless 0 0 0  Easily annoyed or irritable 0 0 0  Afraid - awful might happen 0 0 0  Total GAD 7 Score 0 0 0  Anxiety Difficulty   Not difficult at all      Review of Systems:   Pertinent items are noted in HPI Denies abnormal vaginal discharge w/ itching/odor/irritation, headaches, visual changes, shortness of breath, chest pain, abdominal pain, severe nausea/vomiting, or problems with urination or bowel movements unless otherwise stated above. Pertinent History Reviewed:  Reviewed past  medical,surgical, social, obstetrical and family history.  Reviewed problem list, medications and allergies. Physical Assessment:   Vitals:   06/24/22 1456 06/24/22 1459  BP: (!) 141/86 127/81  Pulse: 87 87  Weight: 223 lb (101.2 kg)   Body mass index is 38.28 kg/m.        Physical Examination:   General appearance: Well appearing, and in no distress  Mental status: Alert, oriented to person, place, and time  Skin: Warm & dry  Cardiovascular: Normal heart rate noted  Respiratory: Normal respiratory effort, no distress  Abdomen: Soft, gravid, nontender  Pelvic: Cervical exam performed         Extremities: Edema: Mild pitting, slight indentation  Fetal Status: Fetal Heart Rate (bpm): 157 u/s   Movement: (!) Decreased  [name] 36 wks,cephalic,posterior placenta gr 2,AFI 22.4 cm,right renal pelvic dilation 11 mm,normal left kidney,FHR 157 bpm,EFW 4445 g 99.9%  NST for decreased fm: FHR baseline 145 bpm, Variability: moderate, Accelerations:present, Decelerations:  Absent= Cat 1/reactive Toco: irregular   Chaperone: Jobe Marker   Results for orders placed or performed in visit on 06/24/22 (from the past  24 hour(s))  POC Urinalysis Dipstick OB   Collection Time: 06/24/22  3:40 PM  Result Value Ref Range   Color, UA     Clarity, UA     Glucose, UA Negative Negative   Bilirubin, UA     Ketones, UA neg    Spec Grav, UA     Blood, UA neg    pH, UA     POC,PROTEIN,UA Small (1+) Negative, Trace, Small (1+), Moderate (2+), Large (3+), 4+   Urobilinogen, UA     Nitrite, UA neg    Leukocytes, UA Small (1+) (A) Negative   Appearance     Odor      Assessment & Plan:  1) Low-risk pregnancy G1P0000 at [redacted]w[redacted]d with an Estimated Date of Delivery: 07/22/22   2) Suspected fetal macrosomia, EFW 4445g, AFI 22.4, normal GTT @ 27.6wk, has gained 59lb this pregnancy-discussed w/ LHE, deliver @ 39wks unless indicated earlier by BP  3) Elevated bp x 1> repeat normal, 1+ proteinuria, will check  labs, return tomorrow for bp check w/ nurse. Reviewed pre-e s/s, reasons to seek care  4) Decreased fm> reactive NST, reviewed [facility], reasons to seek care  5) Fetal Rt RPD 45mm> discussed, plan f/u by peds   Meds: No orders of the defined types were placed in this encounter.  Labs/procedures today: GBS, GC/CT, SVE, NST, and U/S  Plan:  Continue routine obstetrical care  Next visit: prefers will be in person for bp check     Reviewed: Preterm labor symptoms and general obstetric precautions including but not limited to vaginal bleeding, contractions, leaking of fluid and fetal movement were reviewed in detail with the patient.  All questions were answered. Does have home bp cuff. Office bp cuff given: not applicable. Check bp daily, let us know if consistently >140 and/or >90.  Follow-up: Return in about 1 day (around 06/25/2022) for nurse bp check.  Future Appointments  Date Time Provider Department Center  06/25/2022  3:30 PM [facility] NURSE [facility]    Orders Placed This Encounter  Procedures   Culture, beta strep (group b only)   CBC   Comprehensive metabolic panel   Protein / creatinine ratio, urine   POC Urinalysis Dipstick OB   Cheral Marker CNM, [facility] 06/24/2022 4:38 PM

## 2022-06-25 ENCOUNTER — Ambulatory Visit (INDEPENDENT_AMBULATORY_CARE_PROVIDER_SITE_OTHER): Payer: 59 | Admitting: *Deleted

## 2022-06-25 ENCOUNTER — Encounter: Payer: Self-pay | Admitting: Advanced Practice Midwife

## 2022-06-25 VITALS — BP 133/84 | HR 83

## 2022-06-25 DIAGNOSIS — Z3403 Encounter for supervision of normal first pregnancy, third trimester: Secondary | ICD-10-CM

## 2022-06-25 LAB — CBC
Hematocrit: 33.7 % — ABNORMAL LOW (ref 34.0–46.6)
Hemoglobin: 11.7 g/dL (ref 11.1–15.9)
MCH: 27.6 pg (ref 26.6–33.0)
MCHC: 34.7 g/dL (ref 31.5–35.7)
MCV: 80 fL (ref 79–97)
Platelets: 257 10*3/uL (ref 150–450)
RBC: 4.24 x10E6/uL (ref 3.77–5.28)
RDW: 11.9 % (ref 11.7–15.4)
WBC: 13.3 10*3/uL — ABNORMAL HIGH (ref 3.4–10.8)

## 2022-06-25 LAB — COMPREHENSIVE METABOLIC PANEL
ALT: 16 IU/L (ref 0–32)
AST: 25 IU/L (ref 0–40)
Albumin/Globulin Ratio: 1.4 (ref 1.2–2.2)
Albumin: 3.9 g/dL — ABNORMAL LOW (ref 4.0–5.0)
Alkaline Phosphatase: 140 IU/L — ABNORMAL HIGH (ref 44–121)
BUN/Creatinine Ratio: 15 (ref 9–23)
BUN: 9 mg/dL (ref 6–20)
Bilirubin Total: 0.3 mg/dL (ref 0.0–1.2)
CO2: 20 mmol/L (ref 20–29)
Calcium: 9.4 mg/dL (ref 8.7–10.2)
Chloride: 104 mmol/L (ref 96–106)
Creatinine, Ser: 0.61 mg/dL (ref 0.57–1.00)
Globulin, Total: 2.7 g/dL (ref 1.5–4.5)
Glucose: 80 mg/dL (ref 70–99)
Potassium: 4.3 mmol/L (ref 3.5–5.2)
Sodium: 140 mmol/L (ref 134–144)
Total Protein: 6.6 g/dL (ref 6.0–8.5)
eGFR: 126 mL/min/{1.73_m2} (ref >59)

## 2022-06-25 LAB — PROTEIN / CREATININE RATIO, URINE
Creatinine, Urine: 162.7 mg/dL
Protein, Ur: 36.2 mg/dL
Protein/Creat Ratio: 222 mg/g creat — ABNORMAL HIGH (ref 0–200)

## 2022-06-25 NOTE — Progress Notes (Signed)
   NURSE VISIT- BLOOD PRESSURE CHECK  SUBJECTIVE:  Heidi Shields is a 26 y.o. G53P0000 female here for BP check. She is [redacted]w[redacted]d pregnant    HYPERTENSION ROS:  Pregnant:  Severe headaches that don't go away with tylenol/other medicines: No  Visual changes (seeing spots/double/blurred vision) No  Severe pain under right breast breast or in center of upper chest No  Severe nausea/vomiting No  Taking medicines as instructed not applicable   OBJECTIVE:  BP 133/84   Pulse 83   LMP 10/15/2021   Appearance alert, well appearing, and in no distress and oriented to person, place, and time.  ASSESSMENT: Pregnancy [redacted]w[redacted]d  blood pressure check  PLAN: Discussed with Philipp Deputy, CNM   Recommendations: no changes needed   Follow-up: as scheduled   Jobe Marker  06/25/2022 3:38 PM

## 2022-06-26 ENCOUNTER — Encounter (HOSPITAL_COMMUNITY): Payer: Self-pay | Admitting: Obstetrics and Gynecology

## 2022-06-26 ENCOUNTER — Inpatient Hospital Stay (HOSPITAL_COMMUNITY)
Admission: AD | Admit: 2022-06-26 | Discharge: 2022-06-26 | Disposition: A | Discharge status: Home / Self Care | Payer: 59 | Attending: Obstetrics and Gynecology | Admitting: Obstetrics and Gynecology

## 2022-06-26 DIAGNOSIS — O133 Gestational [pregnancy-induced] hypertension without significant proteinuria, third trimester: Secondary | ICD-10-CM | POA: Diagnosis not present

## 2022-06-26 DIAGNOSIS — O26893 Other specified pregnancy related conditions, third trimester: Secondary | ICD-10-CM | POA: Insufficient documentation

## 2022-06-26 DIAGNOSIS — R519 Headache, unspecified: Secondary | ICD-10-CM | POA: Diagnosis not present

## 2022-06-26 DIAGNOSIS — Z3A36 36 weeks gestation of pregnancy: Secondary | ICD-10-CM

## 2022-06-26 LAB — CBC
HCT: 32.2 % — ABNORMAL LOW (ref 36.0–46.0)
Hemoglobin: 11.2 g/dL — ABNORMAL LOW (ref 12.0–15.0)
MCH: 27.6 pg (ref 26.0–34.0)
MCHC: 34.8 g/dL (ref 30.0–36.0)
MCV: 79.3 fL — ABNORMAL LOW (ref 80.0–100.0)
Platelets: 242 10*3/uL (ref 150–400)
RBC: 4.06 MIL/uL (ref 3.87–5.11)
RDW: 13.2 % (ref 11.5–15.5)
WBC: 13.9 10*3/uL — ABNORMAL HIGH (ref 4.0–10.5)
nRBC: 0 % (ref 0.0–0.2)

## 2022-06-26 LAB — CERVICOVAGINAL ANCILLARY ONLY
Chlamydia: NEGATIVE
Comment: NEGATIVE
Comment: NORMAL
Neisseria Gonorrhea: NEGATIVE

## 2022-06-26 LAB — COMPREHENSIVE METABOLIC PANEL
ALT: 16 U/L (ref 0–44)
AST: 25 U/L (ref 15–41)
Albumin: 2.8 g/dL — ABNORMAL LOW (ref 3.5–5.0)
Alkaline Phosphatase: 112 U/L (ref 38–126)
Anion gap: 7 (ref 5–15)
BUN: 8 mg/dL (ref 6–20)
CO2: 21 mmol/L — ABNORMAL LOW (ref 22–32)
Calcium: 9.2 mg/dL (ref 8.9–10.3)
Chloride: 108 mmol/L (ref 98–111)
Creatinine, Ser: 0.63 mg/dL (ref 0.44–1.00)
GFR, Estimated: >60 mL/min (ref >60)
Glucose, Bld: 107 mg/dL — ABNORMAL HIGH (ref 70–99)
Potassium: 3.8 mmol/L (ref 3.5–5.1)
Sodium: 136 mmol/L (ref 135–145)
Total Bilirubin: 0.3 mg/dL (ref 0.3–1.2)
Total Protein: 6.1 g/dL — ABNORMAL LOW (ref 6.5–8.1)

## 2022-06-26 LAB — PROTEIN / CREATININE RATIO, URINE
Creatinine, Urine: 124 mg/dL
Protein Creatinine Ratio: 0.2 mg/mg{Cre} — ABNORMAL HIGH (ref 0.00–0.15)
Total Protein, Urine: 25 mg/dL

## 2022-06-26 MED ORDER — ACETAMINOPHEN 500 MG PO TABS
1000.0000 mg | ORAL_TABLET | Freq: Once | ORAL | Status: AC
  Administered 2022-06-26: 1000 mg via ORAL
  Filled 2022-06-26: qty 2

## 2022-06-26 NOTE — MAU Note (Signed)
.  Heidi Shields is a 26 y.o. at [redacted]w[redacted]d here in MAU reporting: She started to have a headache at home and took her blood pressure and it was (140's/90's) Has not taken anything for her headache. Denies epigastric pain or visual changes. +FM. Denies ctx or leaking of fluid.   Pain score: 3/10 Vitals:   06/26/22 2005 06/26/22 2007  BP: (!) 166/91 (!) 149/100  Pulse: (!) 116   Resp: 18   Temp: 98.6 F (37 C)   SpO2: 100%      FHT:163

## 2022-06-26 NOTE — MAU Provider Note (Signed)
History     [id number]  Arrival date and time: 06/26/22 1954    Chief Complaint  Patient presents with   Hypertension   Headache     HPI Heidi Shields is a 26 y.o. at [redacted]w[redacted]d who presents for hypertension & headache. Had elevated BP in the office earlier this week with normal labs. Reports frontal headache since 6 pm. Rates pain 3/10. Hasn't treated symptoms. Denies visual disturbance, epigastric pain, abdominal pain, vaginal bleeding, or LOF. Good fetal movement.    OB History     Gravida  1   Para  0   Term  0   Preterm  0   AB  0   Living  0      SAB  0   IAB  0   Ectopic  0   Multiple  0   Live Births  0           Past Medical History:  Diagnosis Date   Acne vulgaris    Anxiety and depression    Chlamydia 07/21/2017   + at GYN screening age 23.  Test of cure done and was negative (Dr. Despina Hidden).   Hay fever    History of mononucleosis syndrome 2012   Irregular menstrual cycle     Past Surgical History:  Procedure Laterality Date   TOE SURGERY     age 49    Family History  Problem Relation Age of Onset   Diabetes Father    Thyroid disease Sister    Cirrhosis Maternal Grandmother    High blood pressure Paternal Grandmother    High blood pressure Paternal Grandfather    Breast cancer Other        Aunt    Allergies  Allergen Reactions   Prednisone Anaphylaxis    Elevated BP, HR and BS   Duloxetine Nausea Only    No current facility-administered medications on file prior to encounter.   Current Outpatient Medications on File Prior to Encounter  Medication Sig Dispense Refill   pantoprazole (PROTONIX) 20 MG tablet Take 1 tablet (20 mg total) by mouth daily. 30 tablet 1   Pediatric Multiple Vitamins (FLINSTONES GUMMIES OMEGA-3 DHA PO) Take by mouth. Takes 2 daily       ROS Pertinent positives and negative per HPI, all others reviewed and negative  Physical Exam   BP 99/68   Pulse 99   Temp 98.6 F (37 C) (Oral)   Resp 18    Ht 5\' 3"  (1.6 m)   Wt 102.8 kg   LMP 10/15/2021   SpO2 99%   BMI 40.14 kg/m   Patient Vitals for the past 24 hrs:  BP Temp Temp src Pulse Resp SpO2 Height Weight  06/26/22 2201 120/80 -- -- 84 -- -- -- --  06/26/22 2146 99/68 -- -- 99 -- -- -- --  06/26/22 2131 133/72 -- -- (!) 106 -- -- -- --  06/26/22 2116 125/82 -- -- (!) 107 -- -- -- --  06/26/22 2101 133/86 -- -- (!) 117 -- -- -- --  06/26/22 2016 127/87 -- -- (!) 122 -- -- -- --  06/26/22 2015 (!) 153/89 -- -- (!) 118 -- 99 % -- --  06/26/22 2007 (!) 149/100 -- -- -- -- -- -- --  06/26/22 2005 (!) 166/91 98.6 F (37 C) Oral (!) 116 18 100 % 5\' 3"  (1.6 m) 102.8 kg    Physical Exam Vitals and nursing note reviewed.  Constitutional:  General: She is not in acute distress.    Appearance: She is well-developed.  Pulmonary:     Effort: Pulmonary effort is normal. No respiratory distress.  Musculoskeletal:     Right lower leg: 2+ Pitting Edema present.     Left lower leg: 2+ Pitting Edema present.  Skin:    General: Skin is warm and dry.  Neurological:     Mental Status: She is alert.     Deep Tendon Reflexes:     Reflex Scores:      Patellar reflexes are 2+ on the right side and 2+ on the left side.    Comments: No clonus       FHT Baseline 155, moderate variability, 15x15 accels, no decels Toco: irregular Cat: 1  Labs Results for orders placed or performed during the hospital encounter of 06/26/22 (from the past 24 hour(s))  Protein / creatinine ratio, urine     Status: Abnormal   Collection Time: 06/26/22  8:18 PM  Result Value Ref Range   Creatinine, Urine 124 mg/dL   Total Protein, Urine 25 mg/dL   Protein Creatinine Ratio 0.20 (H) 0.00 - 0.15 mg/mg[Cre]  CBC     Status: Abnormal   Collection Time: 06/26/22  9:24 PM  Result Value Ref Range   WBC 13.9 (H) 4.0 - 10.5 K/uL   RBC 4.06 3.87 - 5.11 MIL/uL   Hemoglobin 11.2 (L) 12.0 - 15.0 g/dL   HCT 44.3 (L) 15.4 - 00.8 %   MCV 79.3 (L) 80.0 - 100.0  fL   MCH 27.6 26.0 - 34.0 pg   MCHC 34.8 30.0 - 36.0 g/dL   RDW 67.6 19.5 - 09.3 %   Platelets 242 150 - 400 K/uL   nRBC 0.0 0.0 - 0.2 %  Comprehensive metabolic panel     Status: Abnormal   Collection Time: 06/26/22  9:24 PM  Result Value Ref Range   Sodium 136 135 - 145 mmol/L   Potassium 3.8 3.5 - 5.1 mmol/L   Chloride 108 98 - 111 mmol/L   CO2 21 (L) 22 - 32 mmol/L   Glucose, Bld 107 (H) 70 - 99 mg/dL   BUN 8 6 - 20 mg/dL   Creatinine, Ser 2.67 0.44 - 1.00 mg/dL   Calcium 9.2 8.9 - 12.4 mg/dL   Total Protein 6.1 (L) 6.5 - 8.1 g/dL   Albumin 2.8 (L) 3.5 - 5.0 g/dL   AST 25 15 - 41 U/L   ALT 16 0 - 44 U/L   Alkaline Phosphatase 112 38 - 126 U/L   Total Bilirubin 0.3 0.3 - 1.2 mg/dL   GFR, Estimated >58 >09 mL/min   Anion gap 7 5 - 15    Imaging No results found.  MAU Course  Procedures Lab Orders         CBC         Comprehensive metabolic panel         Protein / creatinine ratio, urine     Meds ordered this encounter  Medications   acetaminophen (TYLENOL) tablet 1,000 mg   Imaging Orders  No imaging studies ordered today    MDM Reviewed prenatal records in epic> Had elevated BP in the office earlier this week with normal preeclampsia labs Labs ordered- results reviewed Treated with oral tylenol  Initial BPs in MAU elevated with one in severe range. BPs improved without intervention & most were normal. Mild headache improved with tylenol & normal preeclampsia labs.  Assessment and Plan  1. Gestational hypertension, third trimester   2. [redacted] weeks gestation of pregnancy    -Reviewed s/s of preeclampsia & reasons to return to MAU -Message sent to FT for f/u appointment tomorrow or Monday - should have BP check & discussion/scheduled for 37 week IOL  Judeth Horn, NP 06/26/22 10:05 PM

## 2022-06-27 ENCOUNTER — Ambulatory Visit (INDEPENDENT_AMBULATORY_CARE_PROVIDER_SITE_OTHER): Payer: 59 | Admitting: Obstetrics & Gynecology

## 2022-06-27 ENCOUNTER — Encounter: Payer: Self-pay | Admitting: Obstetrics & Gynecology

## 2022-06-27 ENCOUNTER — Inpatient Hospital Stay (HOSPITAL_COMMUNITY): Admission: AD | Admit: 2022-06-27 | Payer: 59 | Source: Home / Self Care | Admitting: Obstetrics and Gynecology

## 2022-06-27 VITALS — BP 140/89 | HR 119

## 2022-06-27 DIAGNOSIS — O139 Gestational [pregnancy-induced] hypertension without significant proteinuria, unspecified trimester: Secondary | ICD-10-CM | POA: Insufficient documentation

## 2022-06-27 DIAGNOSIS — O133 Gestational [pregnancy-induced] hypertension without significant proteinuria, third trimester: Secondary | ICD-10-CM

## 2022-06-27 DIAGNOSIS — Z8759 Personal history of other complications of pregnancy, childbirth and the puerperium: Secondary | ICD-10-CM | POA: Insufficient documentation

## 2022-06-27 DIAGNOSIS — Z3A36 36 weeks gestation of pregnancy: Secondary | ICD-10-CM

## 2022-06-27 DIAGNOSIS — O0993 Supervision of high risk pregnancy, unspecified, third trimester: Secondary | ICD-10-CM

## 2022-06-27 LAB — POCT URINALYSIS DIPSTICK OB
Blood, UA: NEGATIVE
Glucose, UA: NEGATIVE
Ketones, UA: NEGATIVE
Leukocytes, UA: NEGATIVE
Nitrite, UA: NEGATIVE
POC,PROTEIN,UA: NEGATIVE

## 2022-06-27 LAB — CULTURE, BETA STREP (GROUP B ONLY): Strep Gp B Culture: POSITIVE — AB

## 2022-06-27 NOTE — Progress Notes (Signed)
HIGH-RISK PREGNANCY VISIT Patient name: Heidi Shields MRN [id number]  Date of birth: [birth year] Chief Complaint:   Routine Prenatal Visit  History of Present Illness:   Heidi Shields is a 26 y.o. G69P0000 female at [redacted]w[redacted]d with an Estimated Date of Delivery: 07/22/22 being seen today for ongoing management of a high-risk pregnancy complicated by: -Gestational HTN Currently asymptomatic, pt seen yesterday in MAU.    Today she reports no acute changes- LE swelling noted.  Contractions: Not present. Vag. Bleeding: None.  Movement: Present. denies leaking of fluid.      04/28/2022    9:11 AM 01/02/2022    3:05 PM 02/15/2021    8:34 AM 07/24/2020    1:42 PM 08/20/2018   10:04 AM  Depression screen PHQ 2/9  Decreased Interest 0 0 0 0 0  Down, Depressed, Hopeless 0 0 0 0 0  PHQ - 2 Score 0 0 0 0 0  Altered sleeping 0 0  0   Tired, decreased energy 0 0  0   Change in appetite 0 1  0   Feeling bad or failure about yourself  0 0  0   Trouble concentrating 0 0  0   Moving slowly or fidgety/restless 0 0  0   Suicidal thoughts 0 0  0   PHQ-9 Score 0 1  0   Difficult doing work/chores    Not difficult at all      Current Outpatient Medications  Medication Instructions   pantoprazole (PROTONIX) 20 mg, Oral, Daily   Pediatric Multiple Vitamins (FLINSTONES GUMMIES OMEGA-3 DHA PO) Oral, Takes 2 daily     Review of Systems:   Pertinent items are noted in HPI Denies abnormal vaginal discharge w/ itching/odor/irritation, headaches, visual changes, shortness of breath, chest pain, abdominal pain, severe nausea/vomiting, or problems with urination or bowel movements unless otherwise stated above. Pertinent History Reviewed:  Reviewed past medical,surgical, social, obstetrical and family history.  Reviewed problem list, medications and allergies. Physical Assessment:   Vitals:   06/27/22 0903  BP: 140/89  Pulse: (!) 119  There is no height or weight on file to calculate BMI.            Physical Examination:   General appearance: alert, well appearing, and in no distress  Mental status: normal mood, behavior, speech, dress, motor activity, and thought processes  Skin: warm & dry   Extremities: Edema: Moderate pitting, indentation subsides rapidly    Cardiovascular: normal heart rate noted  Respiratory: normal respiratory effort, no distress  Abdomen: gravid, soft, non-tender  Pelvic: Cervical exam deferred         Fetal Status: Fetal Heart Rate (bpm): 155 Fundal Height: 36 cm Movement: Present    Fetal Surveillance Testing today: doppler   Chaperone: N/A    Results for orders placed or performed in visit on 06/27/22 (from the past 24 hour(s))  POC Urinalysis Dipstick OB   Collection Time: 06/27/22  9:04 AM  Result Value Ref Range   Color, UA     Clarity, UA     Glucose, UA Negative Negative   Bilirubin, UA     Ketones, UA neg    Spec Grav, UA     Blood, UA neg    pH, UA     POC,PROTEIN,UA Negative Negative, Trace, Small (1+), Moderate (2+), Large (3+), 4+   Urobilinogen, UA     Nitrite, UA neg    Leukocytes, UA Negative Negative   Appearance  Odor    Results for orders placed or performed during the hospital encounter of 06/26/22 (from the past 24 hour(s))  Protein / creatinine ratio, urine   Collection Time: 06/26/22  8:18 PM  Result Value Ref Range   Creatinine, Urine 124 mg/dL   Total Protein, Urine 25 mg/dL   Protein Creatinine Ratio 0.20 (H) 0.00 - 0.15 mg/mg[Cre]  CBC   Collection Time: 06/26/22  9:24 PM  Result Value Ref Range   WBC 13.9 (H) 4.0 - 10.5 K/uL   RBC 4.06 3.87 - 5.11 MIL/uL   Hemoglobin 11.2 (L) 12.0 - 15.0 g/dL   HCT 16.9 (L) 67.8 - 93.8 %   MCV 79.3 (L) 80.0 - 100.0 fL   MCH 27.6 26.0 - 34.0 pg   MCHC 34.8 30.0 - 36.0 g/dL   RDW 10.1 75.1 - 02.5 %   Platelets 242 150 - 400 K/uL   nRBC 0.0 0.0 - 0.2 %  Comprehensive metabolic panel   Collection Time: 06/26/22  9:24 PM  Result Value Ref Range   Sodium 136 135 - 145  mmol/L   Potassium 3.8 3.5 - 5.1 mmol/L   Chloride 108 98 - 111 mmol/L   CO2 21 (L) 22 - 32 mmol/L   Glucose, Bld 107 (H) 70 - 99 mg/dL   BUN 8 6 - 20 mg/dL   Creatinine, Ser 8.52 0.44 - 1.00 mg/dL   Calcium 9.2 8.9 - 77.8 mg/dL   Total Protein 6.1 (L) 6.5 - 8.1 g/dL   Albumin 2.8 (L) 3.5 - 5.0 g/dL   AST 25 15 - 41 U/L   ALT 16 0 - 44 U/L   Alkaline Phosphatase 112 38 - 126 U/L   Total Bilirubin 0.3 0.3 - 1.2 mg/dL   GFR, Estimated >24 >23 mL/min   Anion gap 7 5 - 15     Assessment & Plan:  High-risk pregnancy: G1P0000 at [redacted]w[redacted]d with an Estimated Date of Delivery: 07/22/22   1) Gestational HTN -reviewed preeclampsia precautions -lab work completed yesterday in MAU, including NST -Last [name] @ 36wk- EFW >99% 4445g -scheduled for IOL @ 37wk  2) GBS pos -PCN in labor  Meds: No orders of the defined types were placed in this encounter.   Labs/procedures today: none  Treatment Plan:  as outlined above  Reviewed: Preterm labor symptoms and general obstetric precautions including but not limited to vaginal bleeding, contractions, leaking of fluid and fetal movement were reviewed in detail with the patient.  All questions were answered. Pt has home bp cuff. Check bp weekly, let us know if >160/100s.   Follow-up: Return in about 2 weeks (around 07/11/2022) for RN BP postpartum check then 5wk pospartum visit (IOL scheduled next Tuesday).   Future Appointments  Date Time Provider Department Center  07/01/2022  7:30 AM [facility] SCHED ROOM [facility] None  07/11/2022 10:30 AM [facility] NURSE [facility]  08/04/2022  2:10 PM Cheral Marker, CNM [facility]    Orders Placed This Encounter  Procedures   POC Urinalysis Dipstick OB    Myna Hidalgo, DO Attending Obstetrician & Gynecologist, [facility] for [employer], [facility]

## 2022-06-28 ENCOUNTER — Other Ambulatory Visit: Payer: Self-pay | Admitting: Family Medicine

## 2022-06-30 ENCOUNTER — Inpatient Hospital Stay (HOSPITAL_COMMUNITY)
Admission: AD | Admit: 2022-06-30 | Discharge: 2022-07-03 | DRG: 787 | Disposition: A | Discharge status: Home / Self Care | Payer: 59 | Attending: Obstetrics and Gynecology | Admitting: Obstetrics and Gynecology

## 2022-06-30 ENCOUNTER — Inpatient Hospital Stay (HOSPITAL_COMMUNITY): Payer: 59 | Admitting: Anesthesiology

## 2022-06-30 ENCOUNTER — Other Ambulatory Visit: Payer: Self-pay

## 2022-06-30 ENCOUNTER — Encounter (HOSPITAL_COMMUNITY): Payer: Self-pay | Admitting: Obstetrics and Gynecology

## 2022-06-30 DIAGNOSIS — O139 Gestational [pregnancy-induced] hypertension without significant proteinuria, unspecified trimester: Secondary | ICD-10-CM | POA: Diagnosis present

## 2022-06-30 DIAGNOSIS — O99215 Obesity complicating the puerperium: Secondary | ICD-10-CM | POA: Diagnosis not present

## 2022-06-30 DIAGNOSIS — O35EXX0 Maternal care for other (suspected) fetal abnormality and damage, fetal genitourinary anomalies, not applicable or unspecified: Secondary | ICD-10-CM | POA: Diagnosis present

## 2022-06-30 DIAGNOSIS — D62 Acute posthemorrhagic anemia: Secondary | ICD-10-CM | POA: Diagnosis not present

## 2022-06-30 DIAGNOSIS — O99824 Streptococcus B carrier state complicating childbirth: Secondary | ICD-10-CM | POA: Diagnosis not present

## 2022-06-30 DIAGNOSIS — O358XX0 Maternal care for other (suspected) fetal abnormality and damage, not applicable or unspecified: Secondary | ICD-10-CM | POA: Diagnosis present

## 2022-06-30 DIAGNOSIS — O99820 Streptococcus B carrier state complicating pregnancy: Secondary | ICD-10-CM | POA: Diagnosis not present

## 2022-06-30 DIAGNOSIS — Z8759 Personal history of other complications of pregnancy, childbirth and the puerperium: Secondary | ICD-10-CM | POA: Diagnosis present

## 2022-06-30 DIAGNOSIS — O9081 Anemia of the puerperium: Secondary | ICD-10-CM | POA: Diagnosis not present

## 2022-06-30 DIAGNOSIS — Z3A36 36 weeks gestation of pregnancy: Secondary | ICD-10-CM

## 2022-06-30 DIAGNOSIS — O0990 Supervision of high risk pregnancy, unspecified, unspecified trimester: Secondary | ICD-10-CM

## 2022-06-30 DIAGNOSIS — O133 Gestational [pregnancy-induced] hypertension without significant proteinuria, third trimester: Secondary | ICD-10-CM

## 2022-06-30 DIAGNOSIS — O134 Gestational [pregnancy-induced] hypertension without significant proteinuria, complicating childbirth: Principal | ICD-10-CM | POA: Diagnosis present

## 2022-06-30 DIAGNOSIS — O4202 Full-term premature rupture of membranes, onset of labor within 24 hours of rupture: Secondary | ICD-10-CM | POA: Diagnosis not present

## 2022-06-30 DIAGNOSIS — O26893 Other specified pregnancy related conditions, third trimester: Secondary | ICD-10-CM | POA: Diagnosis present

## 2022-06-30 DIAGNOSIS — O0993 Supervision of high risk pregnancy, unspecified, third trimester: Principal | ICD-10-CM

## 2022-06-30 DIAGNOSIS — Z3A37 37 weeks gestation of pregnancy: Secondary | ICD-10-CM | POA: Diagnosis not present

## 2022-06-30 LAB — COMPREHENSIVE METABOLIC PANEL
ALT: 15 U/L (ref 0–44)
AST: 24 U/L (ref 15–41)
Albumin: 2.9 g/dL — ABNORMAL LOW (ref 3.5–5.0)
Alkaline Phosphatase: 120 U/L (ref 38–126)
Anion gap: 14 (ref 5–15)
BUN: 9 mg/dL (ref 6–20)
CO2: 18 mmol/L — ABNORMAL LOW (ref 22–32)
Calcium: 9.3 mg/dL (ref 8.9–10.3)
Chloride: 105 mmol/L (ref 98–111)
Creatinine, Ser: 0.66 mg/dL (ref 0.44–1.00)
GFR, Estimated: >60 mL/min (ref >60)
Glucose, Bld: 89 mg/dL (ref 70–99)
Potassium: 4 mmol/L (ref 3.5–5.1)
Sodium: 137 mmol/L (ref 135–145)
Total Bilirubin: 0.7 mg/dL (ref 0.3–1.2)
Total Protein: 6.1 g/dL — ABNORMAL LOW (ref 6.5–8.1)

## 2022-06-30 LAB — CBC
HCT: 33.9 % — ABNORMAL LOW (ref 36.0–46.0)
Hemoglobin: 11.7 g/dL — ABNORMAL LOW (ref 12.0–15.0)
MCH: 28.1 pg (ref 26.0–34.0)
MCHC: 34.5 g/dL (ref 30.0–36.0)
MCV: 81.5 fL (ref 80.0–100.0)
Platelets: 258 10*3/uL (ref 150–400)
RBC: 4.16 MIL/uL (ref 3.87–5.11)
RDW: 13.1 % (ref 11.5–15.5)
WBC: 14.3 10*3/uL — ABNORMAL HIGH (ref 4.0–10.5)
nRBC: 0 % (ref 0.0–0.2)

## 2022-06-30 LAB — TYPE AND SCREEN
ABO/RH(D): O POS
Antibody Screen: NEGATIVE

## 2022-06-30 LAB — POCT FERN TEST: POCT Fern Test: NEGATIVE

## 2022-06-30 LAB — AMNISURE RUPTURE OF MEMBRANE (ROM) NOT AT ARMC: Amnisure ROM: POSITIVE

## 2022-06-30 MED ORDER — LACTATED RINGERS IV SOLN: 500.0000 mL | INTRAVENOUS | Status: DC | PRN

## 2022-06-30 MED ORDER — PHENYLEPHRINE 80 MCG/ML (10ML) SYRINGE FOR IV PUSH (FOR BLOOD PRESSURE SUPPORT)
80.0000 ug | PREFILLED_SYRINGE | INTRAVENOUS | Status: AC | PRN
  Administered 2022-07-01 (×3): 80 ug via INTRAVENOUS

## 2022-06-30 MED ORDER — PENICILLIN G POT IN DEXTROSE 60000 UNIT/ML IV SOLN
3.0000 10*6.[IU] | INTRAVENOUS | Status: DC
  Administered 2022-07-01 (×5): 3 10*6.[IU] via INTRAVENOUS
  Filled 2022-06-30 (×5): qty 50

## 2022-06-30 MED ORDER — PHENYLEPHRINE 80 MCG/ML (10ML) SYRINGE FOR IV PUSH (FOR BLOOD PRESSURE SUPPORT)
80.0000 ug | PREFILLED_SYRINGE | INTRAVENOUS | Status: DC | PRN
  Filled 2022-06-30: qty 10

## 2022-06-30 MED ORDER — PENICILLIN G POTASSIUM 5000000 UNITS IJ SOLR
5.0000 10*6.[IU] | Freq: Once | INTRAMUSCULAR | Status: AC
  Administered 2022-06-30: 5 10*6.[IU] via INTRAVENOUS
  Filled 2022-06-30: qty 5

## 2022-06-30 MED ORDER — LACTATED RINGERS IV SOLN: INTRAVENOUS | Status: DC

## 2022-06-30 MED ORDER — LACTATED RINGERS IV SOLN
500.0000 mL | Freq: Once | INTRAVENOUS | Status: AC
  Administered 2022-06-30: 500 mL via INTRAVENOUS

## 2022-06-30 MED ORDER — ONDANSETRON HCL 4 MG/2ML IJ SOLN
4.0000 mg | Freq: Four times a day (QID) | INTRAMUSCULAR | Status: DC
  Administered 2022-06-30: 4 mg via INTRAVENOUS
  Filled 2022-06-30 (×2): qty 2

## 2022-06-30 MED ORDER — EPHEDRINE 5 MG/ML INJ: 10.0000 mg | INTRAVENOUS | Status: DC | PRN

## 2022-06-30 MED ORDER — FENTANYL-BUPIVACAINE-NACL 0.5-0.125-0.9 MG/250ML-% EP SOLN
12.0000 mL/h | EPIDURAL | Status: DC | PRN
  Administered 2022-06-30 – 2022-07-01 (×2): 12 mL/h via EPIDURAL
  Filled 2022-06-30 (×2): qty 250

## 2022-06-30 MED ORDER — DIPHENHYDRAMINE HCL 50 MG/ML IJ SOLN: 12.5000 mg | INTRAMUSCULAR | Status: DC | PRN

## 2022-06-30 MED ORDER — LIDOCAINE HCL (PF) 1 % IJ SOLN
INTRAMUSCULAR | Status: DC | PRN
  Administered 2022-06-30 (×2): 4 mL via EPIDURAL

## 2022-06-30 NOTE — H&P (Addendum)
OBSTETRIC ADMISSION HISTORY AND PHYSICAL  Heidi Shields is a 26 y.o. female G1P0000 with IUP at [redacted]w[redacted]d by LMP presenting for SROM with EFW 99%. She reports her water breaking at 1400 today, described at clear fluid. Amniosure positive in MAU. She reports +FMs, No LOF, no VB, no blurry vision, headaches or peripheral edema, and RUQ pain.  She plans on bottle feeding. She requests COCs for birth control. She received her prenatal [facility]   Dating: By LMP, confirmed by U/S --->  Estimated Date of Delivery: 07/22/22  Sono:   @[redacted]w[redacted]d , CWD, right renal pelvic dilation 85mm and normal left kidney - all other findings normal, cephalic presentation,  [id number], 99% EFW   Prenatal History/Complications:  gestational HTN   Past Medical History: Past Medical History:  Diagnosis Date   Acne vulgaris    Anxiety and depression    Chlamydia 07/21/2017   + at GYN screening age 35.  Test of cure done and was negative (Dr. Elonda Husky).   Hay fever    History of mononucleosis syndrome 2012   Irregular menstrual cycle     Past Surgical History: Past Surgical History:  Procedure Laterality Date   TOE SURGERY     age 56    Obstetrical History: OB History     Gravida  1   Para  0   Term  0   Preterm  0   AB  0   Living  0      SAB  0   IAB  0   Ectopic  0   Multiple  0   Live Births  0           Social History Social History   Socioeconomic History   Marital status: Married    Spouse name: Not on file   Number of children: Not on file   Years of education: Not on file   Highest education level: Not on file  Occupational History   Not on file  Tobacco Use   Smoking status: Never   Smokeless tobacco: Never  Vaping Use   Vaping Use: Never used  Substance and Sexual Activity   Alcohol use: Not Currently    Comment: occ   Drug use: No   Sexual activity: Yes    Birth control/protection: None  Other Topics Concern   Not on file  Social History Narrative    Married 2021.   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   No T/A/Ds.   Social Determinants of Health   Financial Resource Strain: Low Risk  (07/24/2020)   Overall Financial Resource Strain (CARDIA)    Difficulty of Paying Living Expenses: Not hard at all  Food Insecurity: No Food Insecurity (07/24/2020)   Hunger Vital Sign    Worried About Running Out of Food in the Last Year: Never true    Ran Out of Food in the Last Year: Never true  Transportation Needs: No Transportation Needs (07/24/2020)   PRAPARE - Hydrologist (Medical): No    Lack of Transportation (Non-Medical): No  Physical Activity: Insufficiently Active (07/24/2020)   Exercise Vital Sign    Days of Exercise per Week: 2 days    Minutes of Exercise per Session: 30 min  Stress: No Stress Concern Present (07/24/2020)   [place]    Feeling of Stress : Not at all  Social Connections: Moderately Integrated (07/24/2020)   Social  Connection and Isolation Panel [NHANES]    Frequency of Communication with Friends and Family: More than three times a week    Frequency of Social Gatherings with Friends and Family: More than three times a week    Attends Religious Services: More than 4 times per year    Active Member of [employer] or Organizations: No    Attends Engineer, structural: Never    Marital Status: Living with partner    Family History: Family History  Problem Relation Age of Onset   Diabetes Father    Thyroid disease Sister    Cirrhosis Maternal Grandmother    High blood pressure Paternal Grandmother    High blood pressure Paternal Grandfather    Breast cancer Other        Aunt    Allergies: Allergies  Allergen Reactions   Prednisone Anaphylaxis    Elevated BP, HR and BS   Duloxetine Nausea Only    Pt denies allergies to latex, iodine, or shellfish.  Medications Prior to Admission  Medication Sig  Dispense Refill Last Dose   pantoprazole (PROTONIX) 20 MG tablet Take 1 tablet (20 mg total) by mouth daily. 30 tablet 1 06/29/2022   Pediatric Multiple Vitamins (FLINSTONES GUMMIES OMEGA-3 DHA PO) Take by mouth. Takes 2 daily   06/29/2022     Review of Systems   All systems reviewed and negative except as stated in HPI  Blood pressure 112/68, pulse 79, temperature 98.3 F (36.8 C), temperature source Oral, resp. rate 14, height 5\' 3"  (1.6 m), weight 102.8 kg, last menstrual period 10/15/2021, SpO2 99 %. General appearance: alert and cooperative Lungs: clear to auscultation bilaterally Heart: regular rate and rhythm Abdomen: soft, non-tender; bowel sounds normal Extremities: Homans sign is negative, no sign of DVT Presentation: cephalic Fetal monitoringBaseline: 160 bpm, Variability: Good {> 6 bpm), Accelerations: Reactive, and Decelerations: Absent Uterine activityFrequency: Every 2-4 minutes Dilation: 3.5 Effacement (%): 70 Station: -3 Exam by:: [email or web address] RN   Prenatal labs: ABO, Rh: --/--/O POS (07/17 1659) Antibody: NEG (07/17 1659) Rubella: 1.89 (01/19 1552) RPR: Non Reactive (05/15 0827)  HBsAg: Negative (01/19 1552)  HIV: Non Reactive (05/15 0827)  GBS: Positive/-- (07/11 1640)  1 hr Glucola: normal Genetic screening:  normal Anatomy 01-29-1984: right renal pelvic dilation 11 mm  Prenatal Transfer Tool  Maternal Diabetes: No Genetic Screening: Normal Maternal Ultrasounds/Referrals: Other:EFW 99%,  right renal pelvic dilation 11 mm Fetal Ultrasounds or other Referrals:  None Maternal Substance Abuse:  No Significant Maternal Medications:  None Significant Maternal Lab Results: Group B Strep positive  Results for orders placed or performed during the hospital encounter of 06/30/22 (from the past 24 hour(s))  POCT fern test   Collection Time: 06/30/22  3:47 PM  Result Value Ref Range   POCT Fern Test Negative = intact amniotic membranes   Amnisure rupture of  membrane (rom)not at [facility]   Collection Time: 06/30/22  3:52 PM  Result Value Ref Range   Amnisure ROM POSITIVE   Type and screen   Collection Time: 06/30/22  4:59 PM  Result Value Ref Range   ABO/RH(D) O POS    Antibody Screen NEG    Sample Expiration      07/03/2022,2359 Performed at [facility], [address], [place], [place]   CBC   Collection Time: 06/30/22  5:05 PM  Result Value Ref Range   WBC 14.3 (H) 4.0 - 10.5 K/uL   RBC 4.16 3.87 - 5.11 MIL/uL  Hemoglobin 11.7 (L) 12.0 - 15.0 g/dL   HCT 21.1 (L) 94.1 - 74.0 %   MCV 81.5 80.0 - 100.0 fL   MCH 28.1 26.0 - 34.0 pg   MCHC 34.5 30.0 - 36.0 g/dL   RDW 81.4 48.1 - 85.6 %   Platelets 258 150 - 400 K/uL   nRBC 0.0 0.0 - 0.2 %  Comprehensive metabolic panel   Collection Time: 06/30/22  5:05 PM  Result Value Ref Range   Sodium 137 135 - 145 mmol/L   Potassium 4.0 3.5 - 5.1 mmol/L   Chloride 105 98 - 111 mmol/L   CO2 18 (L) 22 - 32 mmol/L   Glucose, Bld 89 70 - 99 mg/dL   BUN 9 6 - 20 mg/dL   Creatinine, Ser 3.14 0.44 - 1.00 mg/dL   Calcium 9.3 8.9 - 97.0 mg/dL   Total Protein 6.1 (L) 6.5 - 8.1 g/dL   Albumin 2.9 (L) 3.5 - 5.0 g/dL   AST 24 15 - 41 U/L   ALT 15 0 - 44 U/L   Alkaline Phosphatase 120 38 - 126 U/L   Total Bilirubin 0.7 0.3 - 1.2 mg/dL   GFR, Estimated >26 >37 mL/min   Anion gap 14 5 - 15    Patient Active Problem List   Diagnosis Date Noted   Gestational hypertension 06/27/2022   Supervision of high-risk pregnancy 01/02/2022   Panic attack 03/24/2018   Headache disorder 03/10/2013    Assessment/Plan:  Heidi Shields is a 26 y.o. G1P0000 at [redacted]w[redacted]d here for SROM (at 1400) and suspected macrosomia at EFW 99%.   #Labor:Patient SROM @1400  and found to be 3-4 cm in MAU and painfully contracting. Will plan for expectant management since patient SROMed ~5 hours ago and last check was 3 hours ago. Plan for recheck in 1-2 hours and consider augmentation if warranted #Pain: Epidural   #FWB: Cat 1 #ID:  GBS + --> PCN #MOF: bottle #MOC: COCs #Circ:  Yes  #Suspected macrosomia No GDM per Glucola test. EWF 4445gm 99%ile at 36 weeks. Discussed possibility of shoulder dystocia in detail with patient and discussed risks and potential maneuvers. Patient expressed understanding.   , DO PGY1  GME ATTESTATION:  I saw and evaluated the patient. I agree with the findings and the plan of care as documented in the resident's note and have made all necessary edits  Glendale Chard, MD, MPH OB Fellow, Faculty Practice [facility], Center for [facility]

## 2022-06-30 NOTE — MAU Provider Note (Signed)
History   [id number]   Chief Complaint  Patient presents with   Contractions   Rupture of Membranes    HPI Heidi Shields is a 26 y.o. female  G1P0000 @36 .6 wks here with report of gush of clear fluid around 1400 today.  Leaking of fluid has not continued. Pt reports regular contractions. She denies vaginal bleeding. Last intercourse was not recent. She reports good fetal movement. All other systems negative.    Patient's last menstrual period was 10/15/2021.  OB History  Gravida Para Term Preterm AB Living  1 0 0 0 0 0  SAB IAB Ectopic Multiple Live Births  0 0 0 0 0    # Outcome Date GA Lbr Len/2nd Weight Sex Delivery Anes PTL Lv  1 Current             Past Medical History:  Diagnosis Date   Acne vulgaris    Anxiety and depression    Chlamydia 07/21/2017   + at GYN screening age 32.  Test of cure done and was negative (Dr. 35).   Hay fever    History of mononucleosis syndrome 2012   Irregular menstrual cycle     Family History  Problem Relation Age of Onset   Diabetes Father    Thyroid disease Sister    Cirrhosis Maternal Grandmother    High blood pressure Paternal Grandmother    High blood pressure Paternal Grandfather    Breast cancer Other        Aunt    Social History   Socioeconomic History   Marital status: Married    Spouse name: Not on file   Number of children: Not on file   Years of education: Not on file   Highest education level: Not on file  Occupational History   Not on file  Tobacco Use   Smoking status: Never   Smokeless tobacco: Never  Vaping Use   Vaping Use: Never used  Substance and Sexual Activity   Alcohol use: Not Currently    Comment: occ   Drug use: No   Sexual activity: Yes    Birth control/protection: None  Other Topics Concern   Not on file  Social History Narrative   Married 2021.   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   No T/A/Ds.   Social Determinants of Health   Financial Resource Strain:  Low Risk  (07/24/2020)   Overall Financial Resource Strain (CARDIA)    Difficulty of Paying Living Expenses: Not hard at all  Food Insecurity: No Food Insecurity (07/24/2020)   Hunger Vital Sign    Worried About Running Out of Food in the Last Year: Never true    Ran Out of Food in the Last Year: Never true  Transportation Needs: No Transportation Needs (07/24/2020)   PRAPARE - 09/23/2020 (Medical): No    Lack of Transportation (Non-Medical): No  Physical Activity: Insufficiently Active (07/24/2020)   Exercise Vital Sign    Days of Exercise per Week: 2 days    Minutes of Exercise per Session: 30 min  Stress: No Stress Concern Present (07/24/2020)   09/23/2020 of Occupational Health - Occupational Stress Questionnaire    Feeling of Stress : Not at all  Social Connections: Moderately Integrated (07/24/2020)   Social Connection and Isolation Panel [NHANES]    Frequency of Communication with Friends and Family: More than three times a week    Frequency of Social Gatherings with Friends  and Family: More than three times a week    Attends Religious Services: More than 4 times per year    Active Member of Clubs or Organizations: No    Attends Banker Meetings: Never    Marital Status: Living with partner    Allergies  Allergen Reactions   Prednisone Anaphylaxis    Elevated BP, HR and BS   Duloxetine Nausea Only    No current facility-administered medications on file prior to encounter.   Current Outpatient Medications on File Prior to Encounter  Medication Sig Dispense Refill   pantoprazole (PROTONIX) 20 MG tablet Take 1 tablet (20 mg total) by mouth daily. 30 tablet 1   Pediatric Multiple Vitamins (FLINSTONES GUMMIES OMEGA-3 DHA PO) Take by mouth. Takes 2 daily       Review of Systems  Gastrointestinal:  Positive for abdominal pain.  Genitourinary:  Positive for vaginal discharge. Negative for vaginal bleeding.     Physical Exam    Vitals:   06/30/22 1546 06/30/22 1601 06/30/22 1616 06/30/22 1631  BP: 115/61 125/81 109/60 (!) 122/58  Pulse: 95 98 83 94  Resp:      Temp:      TempSrc:      SpO2: 97% 97% 98% 98%  Weight:      Height:        Physical Exam Vitals and nursing note reviewed.  Constitutional:      General: She is not in acute distress.    Appearance: Normal appearance.  HENT:     Head: Normocephalic and atraumatic.  Pulmonary:     Effort: Pulmonary effort is normal. No respiratory distress.  Genitourinary:    Comments: SSE: no pool SVE: 3.5/70/-2, vtx Musculoskeletal:        General: Normal range of motion.     Cervical back: Normal range of motion.  Skin:    General: Skin is warm and dry.  Neurological:     General: No focal deficit present.     Mental Status: She is alert and oriented to person, place, and time.  Psychiatric:        Mood and Affect: Mood normal.        Behavior: Behavior normal.   EFM: 155 bpm, mod variability, + accels, no decels Toco: 3-4  Results for orders placed or performed during the hospital encounter of 06/30/22 (from the past 24 hour(s))  POCT fern test     Status: None   Collection Time: 06/30/22  3:47 PM  Result Value Ref Range   POCT Fern Test Negative = intact amniotic membranes   Amnisure rupture of membrane (rom)not at [facility]     Status: None   Collection Time: 06/30/22  3:52 PM  Result Value Ref Range   Amnisure ROM POSITIVE    MAU Course  Procedures  MDM Labs ordered and reviewed. SROM confirmed. Plan for admit.  Assessment and Plan  [redacted] weeks gestation Reactive NST PROM Admit to LD Mngt per labor team   Donette Larry, [place] 06/30/2022 4:45 PM

## 2022-06-30 NOTE — Anesthesia Procedure Notes (Signed)
Epidural Patient location during procedure: OB Start time: 06/30/2022 6:17 PM End time: 06/30/2022 6:22 PM  Staffing Anesthesiologist: Kaylyn Layer, MD Performed: anesthesiologist   Preanesthetic Checklist Completed: patient identified, IV checked, risks and benefits discussed, monitors and equipment checked, pre-op evaluation and timeout performed  Epidural Patient position: sitting Prep: DuraPrep and site prepped and draped Patient monitoring: continuous pulse ox, blood pressure and heart rate Approach: midline Location: L2-L3 Injection technique: LOR air  Needle:  Needle type: Tuohy  Needle gauge: 17 G Needle length: 9 cm Needle insertion depth: 6 cm Catheter type: closed end flexible Catheter size: 19 Gauge Catheter at skin depth: 11 cm Test dose: negative and Other (1% lidocaine)  Assessment Events: blood not aspirated, injection not painful, no injection resistance, no paresthesia and negative IV test  Additional Notes Patient identified. Risks, benefits, and alternatives discussed with patient including but not limited to bleeding, infection, nerve damage, paralysis, failed block, incomplete pain control, headache, blood pressure changes, nausea, vomiting, reactions to medication, itching, and postpartum back pain. Confirmed with bedside nurse the patient's most recent platelet count. Confirmed with patient that they are not currently taking any anticoagulation, have any bleeding history, or any family history of bleeding disorders. Patient expressed understanding and wished to proceed. All questions were answered. Sterile technique was used throughout the entire procedure. Please see nursing notes for vital signs.   Crisp LOR on second attempt (first at L3-4, second at L2-3). Test dose was given through epidural catheter and negative prior to continuing to dose epidural or start infusion. Warning signs of high block given to the patient including shortness of breath,  tingling/numbness in hands, complete motor block, or any concerning symptoms with instructions to call for help. Patient was given instructions on fall risk and not to get out of bed. All questions and concerns addressed with instructions to call with any issues or inadequate analgesia.  Reason for block:procedure for pain

## 2022-06-30 NOTE — Progress Notes (Signed)
Labor Progress Note Heidi Shields is a 26 y.o. G1P0000 at [redacted]w[redacted]d presented for SROM.   S: Comfortable with epidural now.   O:  BP 110/62   Pulse 79   Temp 98.3 F (36.8 C) (Oral)   Resp 14   Ht 5\' 3"  (1.6 m)   Wt 102.8 kg   LMP 10/15/2021   SpO2 100%   BMI 40.16 kg/m  EFM: 140/mod/15x15/none  CVE: Dilation: 5.5 Effacement (%): 90 Station: -3 Presentation: Vertex Exam by:: Dr. [email or web address]   [name]: 26 y.o. G1P0000 [redacted]w[redacted]d  #Labor: Progressing well with expectant management, with contractions every 1-3 minutes. [redacted]w[redacted]d is present, but head is quite posterior still. Will cont to allow expectant management, AROM in the future if no longer progressing/better engaged (and after adequate GBS coverage).  #Pain: Epidural  #FWB: Cat I  #GBS positive> PCN (first dose at 2200)    [name], DO 12:00 AM

## 2022-06-30 NOTE — Anesthesia Preprocedure Evaluation (Signed)
Anesthesia Evaluation  Patient identified by MRN, date of birth, ID band Patient awake    Reviewed: Allergy & Precautions, Patient's Chart, lab work & pertinent test results  History of Anesthesia Complications Negative for: history of anesthetic complications  Airway Mallampati: II  TM Distance: >3 FB Neck ROM: Full    Dental no notable dental hx.    Pulmonary neg pulmonary ROS,    Pulmonary exam normal        Cardiovascular hypertension (gestational), Normal cardiovascular exam     Neuro/Psych  Headaches, Depression    GI/Hepatic negative GI ROS, Neg liver ROS,   Endo/Other  Morbid obesity  Renal/GU negative Renal ROS  negative genitourinary   Musculoskeletal negative musculoskeletal ROS (+)   Abdominal   Peds  Hematology negative hematology ROS (+)   Anesthesia Other Findings Day of surgery medications reviewed with patient.  Reproductive/Obstetrics (+) Pregnancy                             Anesthesia Physical Anesthesia Plan  ASA: 3  Anesthesia Plan: Epidural   Post-op Pain Management:    Induction:   PONV Risk Score and Plan: Treatment may vary due to age or medical condition  Airway Management Planned: Natural Airway  Additional Equipment: Fetal Monitoring  Intra-op Plan:   Post-operative Plan:   Informed Consent: I have reviewed the patients History and Physical, chart, labs and discussed the procedure including the risks, benefits and alternatives for the proposed anesthesia with the patient or authorized representative who has indicated his/her understanding and acceptance.       Plan Discussed with:   Anesthesia Plan Comments:         Anesthesia Quick Evaluation

## 2022-06-30 NOTE — MAU Note (Addendum)
.  COBY ANTROBUS is a 26 y.o. at [redacted]w[redacted]d here in MAU reporting: water broke at 1400, clear fluid. States she felt a "pop" and the fluid came out after her shower. Had some bloody show when her mucus plug came out, but none since. Endorses good fetal movement. Is having contractions but has not timed them.   Onset of complaint: 1400 Pain score: 8 Lab orders placed from triage:  fern

## 2022-07-01 ENCOUNTER — Encounter: Payer: 59 | Admitting: Obstetrics & Gynecology

## 2022-07-01 ENCOUNTER — Encounter (HOSPITAL_COMMUNITY)
Admission: AD | Disposition: A | Discharge status: Home / Self Care | Payer: Self-pay | Source: Home / Self Care | Attending: Obstetrics and Gynecology

## 2022-07-01 ENCOUNTER — Inpatient Hospital Stay (HOSPITAL_COMMUNITY): Payer: 59

## 2022-07-01 DIAGNOSIS — O134 Gestational [pregnancy-induced] hypertension without significant proteinuria, complicating childbirth: Secondary | ICD-10-CM

## 2022-07-01 DIAGNOSIS — O4202 Full-term premature rupture of membranes, onset of labor within 24 hours of rupture: Secondary | ICD-10-CM

## 2022-07-01 DIAGNOSIS — Z3A37 37 weeks gestation of pregnancy: Secondary | ICD-10-CM

## 2022-07-01 DIAGNOSIS — Z3A36 36 weeks gestation of pregnancy: Secondary | ICD-10-CM

## 2022-07-01 DIAGNOSIS — O99820 Streptococcus B carrier state complicating pregnancy: Secondary | ICD-10-CM

## 2022-07-01 DIAGNOSIS — O35EXX0 Maternal care for other (suspected) fetal abnormality and damage, fetal genitourinary anomalies, not applicable or unspecified: Secondary | ICD-10-CM | POA: Diagnosis present

## 2022-07-01 LAB — RPR: RPR Ser Ql: NONREACTIVE

## 2022-07-01 SURGERY — Surgical Case
Anesthesia: Epidural | Laterality: Bilateral | Wound class: Clean Contaminated

## 2022-07-01 MED ORDER — OXYCODONE-ACETAMINOPHEN 5-325 MG PO TABS: 1.0000 | ORAL_TABLET | ORAL | Status: DC | PRN

## 2022-07-01 MED ORDER — LIDOCAINE-EPINEPHRINE (PF) 2 %-1:200000 IJ SOLN
INTRAMUSCULAR | Status: DC | PRN
  Administered 2022-07-01 (×3): 5 mL via EPIDURAL

## 2022-07-01 MED ORDER — METHYLERGONOVINE MALEATE 0.2 MG/ML IJ SOLN
INTRAMUSCULAR | Status: DC | PRN
  Administered 2022-07-01: .2 mg via INTRAMUSCULAR

## 2022-07-01 MED ORDER — SODIUM CHLORIDE 0.9 % IV SOLN
500.0000 mg | INTRAVENOUS | Status: AC
  Administered 2022-07-01: 500 mg via INTRAVENOUS

## 2022-07-01 MED ORDER — MORPHINE SULFATE (PF) 0.5 MG/ML IJ SOLN
INTRAMUSCULAR | Status: DC | PRN
  Administered 2022-07-01: 3 mg via EPIDURAL

## 2022-07-01 MED ORDER — MORPHINE SULFATE (PF) 0.5 MG/ML IJ SOLN
INTRAMUSCULAR | Status: AC
  Filled 2022-07-01: qty 10

## 2022-07-01 MED ORDER — METOCLOPRAMIDE HCL 5 MG/ML IJ SOLN
INTRAMUSCULAR | Status: DC | PRN
  Administered 2022-07-01: 5 mg via INTRAVENOUS

## 2022-07-01 MED ORDER — TERBUTALINE SULFATE 1 MG/ML IJ SOLN: 0.2500 mg | Freq: Once | INTRAMUSCULAR | Status: DC | PRN

## 2022-07-01 MED ORDER — CEFAZOLIN SODIUM-DEXTROSE 2-3 GM-%(50ML) IV SOLR
INTRAVENOUS | Status: DC | PRN
  Administered 2022-07-01: 2 g via INTRAVENOUS

## 2022-07-01 MED ORDER — SCOPOLAMINE 1 MG/3DAYS TD PT72
MEDICATED_PATCH | TRANSDERMAL | Status: AC
  Filled 2022-07-01: qty 1

## 2022-07-01 MED ORDER — PHENYLEPHRINE 80 MCG/ML (10ML) SYRINGE FOR IV PUSH (FOR BLOOD PRESSURE SUPPORT)
PREFILLED_SYRINGE | INTRAVENOUS | Status: DC | PRN
  Administered 2022-07-01 (×2): 160 ug via INTRAVENOUS

## 2022-07-01 MED ORDER — SOD CITRATE-CITRIC ACID 500-334 MG/5ML PO SOLN
ORAL | Status: AC
  Filled 2022-07-01: qty 30

## 2022-07-01 MED ORDER — FENTANYL CITRATE (PF) 100 MCG/2ML IJ SOLN
INTRAMUSCULAR | Status: AC
  Filled 2022-07-01: qty 2

## 2022-07-01 MED ORDER — OXYTOCIN BOLUS FROM INFUSION: 333.0000 mL | Freq: Once | INTRAVENOUS | Status: DC

## 2022-07-01 MED ORDER — OXYTOCIN-SODIUM CHLORIDE 30-0.9 UT/500ML-% IV SOLN
INTRAVENOUS | Status: DC | PRN
  Administered 2022-07-01 (×2): 30 [IU] via INTRAVENOUS

## 2022-07-01 MED ORDER — LIDOCAINE HCL (PF) 1 % IJ SOLN: 30.0000 mL | INTRAMUSCULAR | Status: DC | PRN

## 2022-07-01 MED ORDER — ONDANSETRON HCL 4 MG/2ML IJ SOLN
INTRAMUSCULAR | Status: DC | PRN
  Administered 2022-07-01: 4 mg via INTRAVENOUS

## 2022-07-01 MED ORDER — FENTANYL CITRATE (PF) 100 MCG/2ML IJ SOLN: 50.0000 ug | INTRAMUSCULAR | Status: DC | PRN

## 2022-07-01 MED ORDER — SCOPOLAMINE 1 MG/3DAYS TD PT72
MEDICATED_PATCH | TRANSDERMAL | Status: DC | PRN
  Administered 2022-07-01: 1 via TRANSDERMAL

## 2022-07-01 MED ORDER — TRANEXAMIC ACID-NACL 1000-0.7 MG/100ML-% IV SOLN
INTRAVENOUS | Status: DC | PRN
  Administered 2022-07-01: 1000 mg via INTRAVENOUS

## 2022-07-01 MED ORDER — MISOPROSTOL 25 MCG QUARTER TABLET: 25.0000 ug | ORAL_TABLET | ORAL | Status: DC | PRN

## 2022-07-01 MED ORDER — PHENYLEPHRINE HCL-NACL 20-0.9 MG/250ML-% IV SOLN
INTRAVENOUS | Status: DC | PRN
  Administered 2022-07-01: 40 ug/min via INTRAVENOUS
  Administered 2022-07-02 (×2): 160 ug via INTRAVENOUS

## 2022-07-01 MED ORDER — OXYTOCIN-SODIUM CHLORIDE 30-0.9 UT/500ML-% IV SOLN
2.5000 [IU]/h | INTRAVENOUS | Status: DC
  Filled 2022-07-01: qty 500

## 2022-07-01 MED ORDER — OXYTOCIN-SODIUM CHLORIDE 30-0.9 UT/500ML-% IV SOLN
1.0000 m[IU]/min | INTRAVENOUS | Status: DC
  Administered 2022-07-01: 2 m[IU]/min via INTRAVENOUS

## 2022-07-01 MED ORDER — OXYTOCIN-SODIUM CHLORIDE 30-0.9 UT/500ML-% IV SOLN
INTRAVENOUS | Status: AC
  Filled 2022-07-01: qty 500

## 2022-07-01 MED ORDER — OXYCODONE-ACETAMINOPHEN 5-325 MG PO TABS: 2.0000 | ORAL_TABLET | ORAL | Status: DC | PRN

## 2022-07-01 MED ORDER — LACTATED RINGERS IV SOLN: INTRAVENOUS | Status: DC | PRN

## 2022-07-01 MED ORDER — STERILE WATER FOR IRRIGATION IR SOLN
Status: DC | PRN
  Administered 2022-07-01: 1000 mL

## 2022-07-01 MED ORDER — PENICILLIN G POT IN DEXTROSE 60000 UNIT/ML IV SOLN: 3.0000 10*6.[IU] | INTRAVENOUS | Status: DC

## 2022-07-01 MED ORDER — SOD CITRATE-CITRIC ACID 500-334 MG/5ML PO SOLN
30.0000 mL | ORAL | Status: AC
  Administered 2022-07-01: 30 mL via ORAL

## 2022-07-01 MED ORDER — ONDANSETRON HCL 4 MG/2ML IJ SOLN
4.0000 mg | Freq: Four times a day (QID) | INTRAMUSCULAR | Status: DC | PRN
  Administered 2022-07-01 (×3): 4 mg via INTRAVENOUS
  Filled 2022-07-01 (×2): qty 2

## 2022-07-01 MED ORDER — SOD CITRATE-CITRIC ACID 500-334 MG/5ML PO SOLN
30.0000 mL | ORAL | Status: DC | PRN
  Administered 2022-07-01: 30 mL via ORAL
  Filled 2022-07-01 (×2): qty 30

## 2022-07-01 MED ORDER — SODIUM CHLORIDE 0.9 % IV SOLN: 5.0000 10*6.[IU] | Freq: Once | INTRAVENOUS | Status: DC

## 2022-07-01 MED ORDER — FENTANYL CITRATE (PF) 100 MCG/2ML IJ SOLN
INTRAMUSCULAR | Status: DC | PRN
  Administered 2022-07-01: 100 ug via EPIDURAL

## 2022-07-01 MED ORDER — ACETAMINOPHEN 325 MG PO TABS
650.0000 mg | ORAL_TABLET | ORAL | Status: DC | PRN
Start: 2022-07-01 — End: 2022-07-02

## 2022-07-01 MED ORDER — CEFAZOLIN SODIUM-DEXTROSE 2-4 GM/100ML-% IV SOLN: 2.0000 g | INTRAVENOUS | Status: DC

## 2022-07-01 SURGICAL SUPPLY — 35 items
APL PRP STRL LF DISP 70% ISPRP (MISCELLANEOUS) ×2
APL SKNCLS STERI-STRIP NONHPOA (GAUZE/BANDAGES/DRESSINGS) ×1
BENZOIN TINCTURE PRP APPL 2/3 (GAUZE/BANDAGES/DRESSINGS) ×1 IMPLANT
CHLORAPREP W/TINT 26 (MISCELLANEOUS) ×6 IMPLANT
CLAMP UMBILICAL CORD (MISCELLANEOUS) ×3 IMPLANT
CLOTH BEACON ORANGE TIMEOUT ST (SAFETY) ×3 IMPLANT
DRSG OPSITE POSTOP 4X10 (GAUZE/BANDAGES/DRESSINGS) ×3 IMPLANT
ELECT REM PT RETURN 9FT ADLT (ELECTROSURGICAL)
ELECTRODE REM PT RTRN 9FT ADLT (ELECTROSURGICAL) ×2 IMPLANT
EXTRACTOR VACUUM KIWI (MISCELLANEOUS) IMPLANT
GLOVE SURG ORTHO 8.0 STRL STRW (GLOVE) ×3 IMPLANT
GOWN STRL REUS W/TWL LRG LVL3 (GOWN DISPOSABLE) ×6 IMPLANT
KIT ABG SYR 3ML LUER SLIP (SYRINGE) IMPLANT
NDL HYPO 25X5/8 SAFETYGLIDE (NEEDLE) IMPLANT
NEEDLE HYPO 25X5/8 SAFETYGLIDE (NEEDLE) IMPLANT
NS IRRIG 1000ML POUR BTL (IV SOLUTION) ×3 IMPLANT
PACK C SECTION WH (CUSTOM PROCEDURE TRAY) ×3 IMPLANT
PAD OB MATERNITY 4.3X12.25 (PERSONAL CARE ITEMS) ×3 IMPLANT
RTRCTR C-SECT PINK 25CM LRG (MISCELLANEOUS) IMPLANT
STRIP CLOSURE SKIN 1/2X4 (GAUZE/BANDAGES/DRESSINGS) ×1 IMPLANT
SUT MON AB-0 CT1 36 (SUTURE) ×6 IMPLANT
SUT PLAIN 0 NONE (SUTURE) IMPLANT
SUT PLAIN 2 0 (SUTURE) ×2
SUT PLAIN ABS 2-0 CT1 27XMFL (SUTURE) IMPLANT
SUT VIC AB 0 CT1 27 (SUTURE) ×6
SUT VIC AB 0 CT1 27XBRD ANBCTR (SUTURE) ×4 IMPLANT
SUT VIC AB 0 CT1 36 (SUTURE) ×1 IMPLANT
SUT VIC AB 2-0 CT1 27 (SUTURE) ×2
SUT VIC AB 2-0 CT1 TAPERPNT 27 (SUTURE) ×2 IMPLANT
SUT VIC AB 4-0 KS 27 (SUTURE) ×1 IMPLANT
SUT VIC AB 4-0 SH 27 (SUTURE) ×2
SUT VIC AB 4-0 SH 27XANBCTRL (SUTURE) ×2 IMPLANT
TOWEL OR 17X24 6PK STRL BLUE (TOWEL DISPOSABLE) ×3 IMPLANT
TRAY FOLEY W/BAG SLVR 14FR LF (SET/KITS/TRAYS/PACK) ×3 IMPLANT
WATER STERILE IRR 1000ML POUR (IV SOLUTION) ×3 IMPLANT

## 2022-07-01 NOTE — Progress Notes (Signed)
Labor Progress Note Heidi Shields is a 26 y.o. G1P0000 at [redacted]w[redacted]d presented for SROM S: Doing well, reporting discomfort with CTX despite epidural - describes as pressure. Reports ongoing LOF. No other acute concerns.  O:  BP 119/77   Pulse 76   Temp 98.5 F (36.9 C) (Oral)   Resp 18   Ht 5\' 3"  (1.6 m)   Wt 102.8 kg   LMP 10/15/2021   SpO2 100%   BMI 40.16 kg/m  EFM: 140 bpm/moderate variability/+accels, intermittent variables  CVE: Dilation: 6.5 Effacement (%): 100 Station: -2 Presentation: Vertex Exam by:: Dr. [email or web address]   [name]: 26 y.o. G1P0000 [redacted]w[redacted]d SROM #Labor: Progressing well. Discussed starting pitocin and potential for IUPC if unchanged at next check, patient agreeable.  #Pain: Epidural #FWB: Cat 1 #GBS positive > PCN #gHTN: CMP reveals LFTs normal, BP within normal limits. Continue to monitor.  [redacted]w[redacted]d, MD 9:54 AM

## 2022-07-01 NOTE — Progress Notes (Signed)
Labor Progress Note YOMARA TOOTHMAN is a 26 y.o. G1P0000 at [redacted]w[redacted]d presented for SROM S: Doing well, but does report a constant low abdominal pressure-like pain. Also with some nausea while laying flat for cervical check. Foley bag with red-tinged urine. Significant amount of bloody show. Support people at the bedside. No acute concerns.  O:  BP 117/68   Pulse 79   Temp 98.3 F (36.8 C) (Oral)   Resp 18   Ht 5\' 3"  (1.6 m)   Wt 102.8 kg   LMP 10/15/2021   SpO2 100%   BMI 40.16 kg/m  EFM: 145 bpm/moderate variability/+accels, no decels  CVE: Dilation: 10 Dilation Complete Date: 07/01/22 Dilation Complete Time: 1629 Effacement (%): 100 Station: -1 Presentation: Vertex Exam by:: Dr. [email or web address]   [name]: 26 y.o. G1P0000 [redacted]w[redacted]d SROM #Labor: Progressing well. Continue pitocin. Will allow patient to labor down for 1-2 hrs given station then reassess for trial pushes. Anticipate vaginal delivery #Pain: Epidural #FWB: Cat 1 #GBS positive > PCN #gHTN: BP stable and within normal limits. Consider repeat CMP given red-tinged urine although likely related to traumatic foley insertion. Continue to monitor.   [redacted]w[redacted]d, MD 4:35 PM

## 2022-07-01 NOTE — Progress Notes (Signed)
Labor Progress Note Heidi Shields is a 26 y.o. G1P0000 at [redacted]w[redacted]d presented for SROM  S: Feeling lightheaded and nauseous currently, but prior had been doing well and trying to rest.   O:  BP 116/82   Pulse 67   Temp 98.5 F (36.9 C) (Axillary)   Resp 18   Ht 5\' 3"  (1.6 m)   Wt 102.8 kg   LMP 10/15/2021   SpO2 100%   BMI 40.16 kg/m  EFM: 150/mod/15x15/none  CVE: Dilation: 6 Effacement (%): 100 Station: -3 Presentation: Vertex Exam by:: Dr. [email or web address]   [name]: 26 y.o. G1P0000 [redacted]w[redacted]d  #Labor: Some progression since last check with expectant management. While bag of water is around -2, head is still several inches behind and can only feel on the maternal R with large area of space without ability to feel head. Fortunately still making change without forebag AROM-- will continue as is and hope with position changes that head will actually engage into the pelvis.  #Pain: Epidural  #FWB: Cat I>> see below  #GBS positive> PCN  BP was low after cervical check. Given a dose of phenyl. Shortly after, FHT decelerated into the 80's for approximately 3 minutes. Given additional dose of phenyl and placed onto her right side without resolution. Will cont to monitor, FHT had been consistently Cat I prior to this.    [redacted]w[redacted]d, DO 4:37 AM

## 2022-07-01 NOTE — Consult Note (Signed)
Neonatology Note:   Attendance at C-section:    I was asked by Dr. Donavan Foil to attend this C/S at term for FTP. The mother is a 26yo G1 with good prenatal care complicated by macrosomia. Of note, @[redacted]w[redacted]d  right renal pelvic dilation 10mm and normal left kidney. +GBS with aIAP; no fever or chorio concerns.  ROM 33h 44m prior to delivery, fluid clear. Infant vigorous with good spontaneous cry and tone. +60 sec DCC  done. Needed minimal bulb suctioning.  Lungs clear to ausc, good tone and pink in DR.  Apgars 8 at 1 minute, 9 at 5 minutes.  Family updated.  To MBU in care of Pediatrician.    26m Dineen Kid, MD

## 2022-07-01 NOTE — Progress Notes (Signed)
Patient doing well  Cervix now 9.5 cm but fetal head still -2 station. Discussed with RN regarding  plan to continue pitocin and position changes to facilitate fetal head descent.  Will plan to check in ~2 hours  Warner Mccreedy, MD, MPH OB Fellow, [facility]

## 2022-07-01 NOTE — Progress Notes (Signed)
Labor Progress Note Heidi Shields is a 26 y.o. G1P0000 at [redacted]w[redacted]d presented for SROM.   S: Feeling good.   O:  BP 123/74   Pulse 79   Temp 98.3 F (36.8 C) (Oral)   Resp 18   Ht 5\' 3"  (1.6 m)   Wt 102.8 kg   LMP 10/15/2021   SpO2 100%   BMI 40.16 kg/m  EFM: 155/mod/15x15/none  CVE: Dilation: 6 Effacement (%): 100 Station: -3 Presentation: Vertex Exam by:: Dr. [email or web address]   [name]: 26 y.o. G1P0000 [redacted]w[redacted]d  #Labor: Unchanged, discussed necessity for forebag AROM at this point to allow head to descend. After verbal consent, performed slow AROM with FSE. Retrieved a large amount of clear fluid. Head now well applied to the cervix, now -2. Will cont to monitor.  #Pain: Epidural  #FWB: Cat I  #GBS positive  #gHTN: BP has been on the lower side since epidural placement. Cont to monitor.   [redacted]w[redacted]d, DO

## 2022-07-01 NOTE — Progress Notes (Signed)
Labor Progress Note   Discussed with RN, she reports that she has been pushing for the past two hours without any progress. Started at -1 station. EFW 4445g one week ago. Went to bedside to evaluate.   Checked-- head still at -1 station with some caput to 0. Position feels like ROT/almost ROA. Did two pushes (w/ Dr. Donavan Foil at bedside as well), shows good maternal effort but no movement of head and still behind the pubic bone.   Given station, not a candidate for vacuum or forceps assistance at this time. Discussed options with patient as currently she is well with FHT Cat I, that she may either continue pushing and hope for progression or proceed with primary CS for arrest of descent.   After discussion with her husband and mom, she would like proceed with a CS due to arrest of descent.   The risks of cesarean section discussed with the patient included but were not limited to: bleeding which may require transfusion or reoperation; infection which may require antibiotics; injury to bowel, bladder, ureters or other surrounding organs; injury to the fetus; need for additional procedures including hysterectomy in the event of a life-threatening hemorrhage; placental abnormalities wth subsequent pregnancies, incisional problems, thromboembolic phenomenon and other postoperative/anesthesia complications. The patient concurred with the proposed plan, giving informed written consent for the procedure. Anesthesia and OR aware. Preoperative prophylactic antibiotics and SCDs ordered on call to the OR. To OR when ready.     Allayne Stack, DO

## 2022-07-02 ENCOUNTER — Encounter (HOSPITAL_COMMUNITY): Payer: Self-pay | Admitting: Obstetrics & Gynecology

## 2022-07-02 DIAGNOSIS — O139 Gestational [pregnancy-induced] hypertension without significant proteinuria, unspecified trimester: Secondary | ICD-10-CM

## 2022-07-02 LAB — CBC
HCT: 26.3 % — ABNORMAL LOW (ref 36.0–46.0)
Hemoglobin: 9.1 g/dL — ABNORMAL LOW (ref 12.0–15.0)
MCH: 28.1 pg (ref 26.0–34.0)
MCHC: 34.6 g/dL (ref 30.0–36.0)
MCV: 81.2 fL (ref 80.0–100.0)
Platelets: 200 10*3/uL (ref 150–400)
RBC: 3.24 MIL/uL — ABNORMAL LOW (ref 3.87–5.11)
RDW: 13.3 % (ref 11.5–15.5)
WBC: 25.3 10*3/uL — ABNORMAL HIGH (ref 4.0–10.5)
nRBC: 0 % (ref 0.0–0.2)

## 2022-07-02 MED ORDER — MEDROXYPROGESTERONE ACETATE 150 MG/ML IM SUSP: 150.0000 mg | INTRAMUSCULAR | Status: DC | PRN

## 2022-07-02 MED ORDER — OXYCODONE HCL 5 MG/5ML PO SOLN: 5.0000 mg | Freq: Once | ORAL | Status: DC | PRN

## 2022-07-02 MED ORDER — GABAPENTIN 100 MG PO CAPS
200.0000 mg | ORAL_CAPSULE | Freq: Every day | ORAL | Status: DC
  Administered 2022-07-02: 200 mg via ORAL
  Filled 2022-07-02: qty 2

## 2022-07-02 MED ORDER — PRENATAL MULTIVITAMIN CH
1.0000 | ORAL_TABLET | Freq: Every day | ORAL | Status: DC
  Administered 2022-07-02 – 2022-07-03 (×2): 1 via ORAL
  Filled 2022-07-02 (×2): qty 1

## 2022-07-02 MED ORDER — OXYCODONE HCL 5 MG PO TABS: 5.0000 mg | ORAL_TABLET | Freq: Once | ORAL | Status: DC | PRN

## 2022-07-02 MED ORDER — ONDANSETRON HCL 4 MG/2ML IJ SOLN: 4.0000 mg | Freq: Once | INTRAMUSCULAR | Status: DC | PRN

## 2022-07-02 MED ORDER — MENTHOL 3 MG MT LOZG: 1.0000 | LOZENGE | OROMUCOSAL | Status: DC | PRN

## 2022-07-02 MED ORDER — ENOXAPARIN SODIUM 40 MG/0.4ML IJ SOSY
40.0000 mg | PREFILLED_SYRINGE | INTRAMUSCULAR | Status: DC
  Filled 2022-07-02: qty 0.4

## 2022-07-02 MED ORDER — KETOROLAC TROMETHAMINE 30 MG/ML IJ SOLN
30.0000 mg | Freq: Once | INTRAMUSCULAR | Status: AC
Start: 2022-07-02 — End: 2022-07-02
  Administered 2022-07-02: 30 mg via INTRAMUSCULAR

## 2022-07-02 MED ORDER — OXYCODONE HCL 5 MG PO TABS: 5.0000 mg | ORAL_TABLET | ORAL | Status: DC | PRN

## 2022-07-02 MED ORDER — ACETAMINOPHEN 325 MG PO TABS: 325.0000 mg | ORAL_TABLET | ORAL | Status: DC | PRN

## 2022-07-02 MED ORDER — MEPERIDINE HCL 25 MG/ML IJ SOLN: 6.2500 mg | INTRAMUSCULAR | Status: DC | PRN

## 2022-07-02 MED ORDER — WITCH HAZEL-GLYCERIN EX PADS: 1.0000 | MEDICATED_PAD | CUTANEOUS | Status: DC | PRN

## 2022-07-02 MED ORDER — SENNOSIDES-DOCUSATE SODIUM 8.6-50 MG PO TABS
2.0000 | ORAL_TABLET | Freq: Every day | ORAL | Status: DC
  Administered 2022-07-03: 2 via ORAL
  Filled 2022-07-02: qty 2

## 2022-07-02 MED ORDER — IBUPROFEN 600 MG PO TABS
600.0000 mg | ORAL_TABLET | Freq: Four times a day (QID) | ORAL | Status: DC
  Administered 2022-07-03 (×2): 600 mg via ORAL
  Filled 2022-07-02 (×2): qty 1

## 2022-07-02 MED ORDER — MEASLES, MUMPS & RUBELLA VAC IJ SOLR: 0.5000 mL | Freq: Once | INTRAMUSCULAR | Status: DC

## 2022-07-02 MED ORDER — ONDANSETRON HCL 4 MG/2ML IJ SOLN
INTRAMUSCULAR | Status: AC
  Filled 2022-07-02: qty 2

## 2022-07-02 MED ORDER — LACTATED RINGERS IV SOLN: INTRAVENOUS | Status: DC

## 2022-07-02 MED ORDER — TETANUS-DIPHTH-ACELL PERTUSSIS 5-2.5-18.5 LF-MCG/0.5 IM SUSY: 0.5000 mL | PREFILLED_SYRINGE | Freq: Once | INTRAMUSCULAR | Status: DC

## 2022-07-02 MED ORDER — DIBUCAINE (PERIANAL) 1 % EX OINT: 1.0000 | TOPICAL_OINTMENT | CUTANEOUS | Status: DC | PRN

## 2022-07-02 MED ORDER — ACETAMINOPHEN 160 MG/5ML PO SOLN: 325.0000 mg | ORAL | Status: DC | PRN

## 2022-07-02 MED ORDER — FERROUS SULFATE 325 (65 FE) MG PO TABS
325.0000 mg | ORAL_TABLET | ORAL | Status: DC
  Administered 2022-07-02: 325 mg via ORAL
  Filled 2022-07-02: qty 1

## 2022-07-02 MED ORDER — POLYETHYLENE GLYCOL 3350 17 G PO PACK: 17.0000 g | PACK | Freq: Every day | ORAL | Status: DC | PRN

## 2022-07-02 MED ORDER — ACETAMINOPHEN 500 MG PO TABS
1000.0000 mg | ORAL_TABLET | Freq: Four times a day (QID) | ORAL | Status: DC
  Administered 2022-07-02 – 2022-07-03 (×6): 1000 mg via ORAL
  Filled 2022-07-02 (×6): qty 2

## 2022-07-02 MED ORDER — KETOROLAC TROMETHAMINE 30 MG/ML IJ SOLN
30.0000 mg | Freq: Four times a day (QID) | INTRAMUSCULAR | Status: AC
  Administered 2022-07-02 (×4): 30 mg via INTRAVENOUS
  Filled 2022-07-02 (×4): qty 1

## 2022-07-02 MED ORDER — COCONUT OIL OIL: 1.0000 | TOPICAL_OIL | Status: DC | PRN

## 2022-07-02 MED ORDER — KETOROLAC TROMETHAMINE 30 MG/ML IJ SOLN
INTRAMUSCULAR | Status: AC
  Filled 2022-07-02: qty 1

## 2022-07-02 MED ORDER — FENTANYL CITRATE (PF) 100 MCG/2ML IJ SOLN
25.0000 ug | INTRAMUSCULAR | Status: DC | PRN
  Administered 2022-07-02: 25 ug via INTRAVENOUS
  Administered 2022-07-02: 50 ug via INTRAVENOUS

## 2022-07-02 MED ORDER — FENTANYL CITRATE (PF) 100 MCG/2ML IJ SOLN
INTRAMUSCULAR | Status: AC
  Filled 2022-07-02: qty 2

## 2022-07-02 MED ORDER — SIMETHICONE 80 MG PO CHEW: 80.0000 mg | CHEWABLE_TABLET | ORAL | Status: DC | PRN

## 2022-07-02 MED ORDER — SIMETHICONE 80 MG PO CHEW
80.0000 mg | CHEWABLE_TABLET | Freq: Three times a day (TID) | ORAL | Status: DC
  Administered 2022-07-02 – 2022-07-03 (×3): 80 mg via ORAL
  Filled 2022-07-02 (×3): qty 1

## 2022-07-02 MED ORDER — MAGNESIUM HYDROXIDE 400 MG/5ML PO SUSP: 30.0000 mL | ORAL | Status: DC | PRN

## 2022-07-02 MED ORDER — PHENYLEPHRINE HCL-NACL 20-0.9 MG/250ML-% IV SOLN
INTRAVENOUS | Status: AC
  Filled 2022-07-02: qty 250

## 2022-07-02 MED ORDER — DIPHENHYDRAMINE HCL 25 MG PO CAPS: 25.0000 mg | ORAL_CAPSULE | Freq: Four times a day (QID) | ORAL | Status: DC | PRN

## 2022-07-02 MED ORDER — OXYTOCIN-SODIUM CHLORIDE 30-0.9 UT/500ML-% IV SOLN: 2.5000 [IU]/h | INTRAVENOUS | Status: AC

## 2022-07-02 MED ORDER — FUROSEMIDE 20 MG PO TABS
20.0000 mg | ORAL_TABLET | Freq: Every day | ORAL | Status: DC
Start: 2022-07-03 — End: 2022-07-03
  Administered 2022-07-03: 20 mg via ORAL
  Filled 2022-07-02: qty 1

## 2022-07-02 NOTE — Progress Notes (Signed)
POSTPARTUM PROGRESS NOTE  POD #1  Subjective:  Heidi Shields is a 26 y.o. G1P1001 s/p pLTCS at [redacted]w[redacted]d. No acute events overnight. She reports she is doing well. She denies any problems with ambulating or po intake. Urinary catheter still in place, will come out later this am. Denies nausea or vomiting. Pain is well controlled.  Lochia is mild.  Objective: Blood pressure 115/67, pulse 77, temperature 98.6 F (37 C), temperature source Oral, resp. rate 16, height 5\' 3"  (1.6 m), weight 102.8 kg, last menstrual period 10/15/2021, SpO2 99 %, unknown if currently breastfeeding.  Physical Exam:  General: alert, cooperative and no distress Chest: no respiratory distress Heart: regular rate, distal pulses intact Uterine Fundus: firm, appropriately tender DVT Evaluation: No calf swelling or tenderness Extremities: Minimal edema Skin: warm, dry; incision clean/dry/intact w/ honeycomb dressing in place  Recent Labs    06/30/22 1705 07/02/22 0416  HGB 11.7* 9.1*  HCT 33.9* 26.3*    Assessment/Plan: Heidi Shields is a 26 y.o. G1P1001 s/p pLTCS at [redacted]w[redacted]d for arrest of descent.  POD#1 - Doing welll; pain well controlled.   Routine postpartum care  OOB, ambulated  Lovenox for VTE prophylaxis  Acute blood loss Anemia: asymptomatic  -Start po ferrous sulfate every other day. Offered IV venofer, but prefers oral.   Contraception: OCPs  Feeding: formula   Dispo: Plan for discharge tomorrow or the following day pending clinical status.   LOS: 2 days   [redacted]w[redacted]d, DO OB Fellow  07/02/2022, 7:51 AM

## 2022-07-02 NOTE — Social Work (Signed)
CSW received consult for hx of Anxiety and Depression.  CSW met with MOB to offer support and complete assessment.    CSW entered room, introduced CSW role and observed MOB in the bed infant in the bassinet and FOB sitting in the chair. CSW offered privacy, MOB allowed FOB to be present. CSW inquired about how MOB has been feeling since giving birth. MOB responded "good no issues". CSW inquired about MOBs hx of Anxiety and Depression. MOB reported she was diagnosed in 2017 and was prescribed a medication that she could not recall. MOB reported that the medication did not work for her so she stopped taking it. MOB reported she has had no Anxiety or Depression symptoms since around the time of the initial diagnosis. MOB reports she has never seen a therapist regarding her mental health.  CSW accessed for safety, MOB denies  any SI of HI. CSW inquired about MOBs supports. MOB identified her mom, husband and extended family as her supports.   CSW provided education regarding the baby blues period vs. perinatal mood disorders, discussed treatment and gave resources for mental health follow up if concerns arise.  CSW recommends self-evaluation during the postpartum time period using the New Mom Checklist from Postpartum Progress and encouraged MOB to contact a medical professional if symptoms are noted at any time.    MOB reported they will be using [facility] for infants follow up care. CSW provided review of Sudden Infant Death Syndrome (SIDS) precautions. MOB reported they have all necessary items including a bassinet and crib for the infant to sleep.    CSW identifies no further need for intervention and no barriers to discharge at this time.  Letta Kocher, [place] Social Worker  [phone]

## 2022-07-02 NOTE — Transfer of Care (Signed)
Immediate Anesthesia Transfer of Care Note  Patient: Heidi Shields  Procedure(s) Performed: CESAREAN SECTION (Bilateral)  Patient Location: PACU  Anesthesia Type:Epidural  Level of Consciousness: awake, alert  and patient cooperative  Airway & Oxygen Therapy: Patient Spontanous Breathing  Post-op Assessment: Report given to RN and Post -op Vital signs reviewed and stable  Post vital signs: Reviewed and stable  Last Vitals:  Vitals Value Taken Time  BP 137/58 07/02/22 0019  Temp 36.9 C 07/02/22 0020  Pulse 83 07/02/22 0023  Resp 28 07/02/22 0023  SpO2 100 % 07/02/22 0023  Vitals shown include unvalidated device data.  Last Pain:  Vitals:   07/02/22 0020  TempSrc: Oral  PainSc:       Patients Stated Pain Goal: 0 (06/30/22 1729)  Complications: No notable events documented.

## 2022-07-02 NOTE — Op Note (Signed)
Einar Crow PROCEDURE DATE: 06/30/2022 - 07/01/2022  PREOPERATIVE DIAGNOSIS: Intrauterine pregnancy at  [redacted]w[redacted]d weeks gestation; failure to progress: arrest of descent, suspected fetal macrosomia   POSTOPERATIVE DIAGNOSIS: The same  PROCEDURE: Primary Low Transverse Cesarean Section  SURGEON:  Dr. Mariel Aloe   ASSISTANT: Dr. Leticia Penna   An experienced assistant was required given the standard of surgical care given the complexity of the case.  This assistant was needed for exposure, dissection, suctioning, retraction, instrument exchange, assisting with delivery with administration of fundal pressure, and for overall help during the procedure.   INDICATIONS: Heidi TRAUGHBER is a 26 y.o. G1P0000 at [redacted]w[redacted]d scheduled for cesarean section secondary to failure to progress: arrest of descent (-1 station after two hours of pushing).  The risks of cesarean section discussed with the patient included but were not limited to: bleeding which may require transfusion or reoperation; infection which may require antibiotics; injury to bowel, bladder, ureters or other surrounding organs; injury to the fetus; need for additional procedures including hysterectomy in the event of a life-threatening hemorrhage; placental abnormalities wth subsequent pregnancies, incisional problems, thromboembolic phenomenon and other postoperative/anesthesia complications. The patient concurred with the proposed plan, giving informed written consent for the procedure.    FINDINGS:  Viable female infant in cephalic presentation.  Apgars 8 and 9, weight 4240g.  Clear amniotic fluid.  Intact placenta, three vessel cord.  Normal uterus, fallopian tubes and ovaries bilaterally.  ANESTHESIA:    Spinal INTRAVENOUS FLUIDS: 700 ml ESTIMATED BLOOD LOSS: 640 ml URINE OUTPUT:  225 ml SPECIMENS: Placenta sent to L&D COMPLICATIONS: None immediate  PROCEDURE IN DETAIL:  The patient received intravenous antibiotics and had sequential  compression devices applied to her lower extremities while in the preoperative area.  She was then taken to the operating room where her epidural anesthesia was dosed up to surgical level and was found to be adequate. She was then placed in a dorsal supine position with a leftward tilt, and prepped and draped in a sterile manner.  A foley catheter was already in place into her bladder and attached to constant gravity, which had been draining pink-red tinged fluid prior to surgery.  After an adequate timeout was performed, a Pfannenstiel skin incision was made with scalpel and carried through to the underlying layer of fascia. The fascia was incised in the midline and this incision was extended bilaterally bluntly. The rectus muscles were separated in the midline bluntly and the peritoneum was entered bluntly. An Alexis retractor was placed to aid in visualization of the uterus. Bladder was notable and distended. Attention was turned to the lower uterine segment where a transverse hysterotomy was made with a scalpel and extended bilaterally bluntly. The infant was successfully delivered, and cord was clamped and cut and infant was handed over to awaiting neonatology team.   Uterine massage was then administered and the placenta delivered intact with three-vessel cord. The uterus was then cleared of clot and debris.  A right lower uterine segment extension was noted first and repaired with 0 Vicryl. The hysterotomy was then closed with 0 Vicryl in a running locked fashion, and an imbricating layer was also placed with a 0 Vicryl. Overall, excellent hemostasis was noted. Her lower uterine segment remained boggy after repair, so IM methergine was given and vaginal bleeding was assessed at time of surgery which was minimal. The abdomen and the pelvis were cleared of all clot and debris and the Jon Gills was removed. Hemostasis was confirmed on all surfaces.  The peritoneum was reapproximated using 2-0 vicryl running  stitches. The fascia was then closed using 0 Vicryl in a running fashion. The subcutaneous layer was reapproximated with plain gut and the skin was closed with 4-0 vicryl. The patient tolerated the procedure well. Sponge, lap, instrument and needle counts were correct x 2. She was taken to the recovery room in stable condition.    Allayne Stack, DO 07/02/2022 12:09 AM

## 2022-07-02 NOTE — Anesthesia Postprocedure Evaluation (Signed)
Anesthesia Post Note  Patient: Heidi Shields  Procedure(s) Performed: CESAREAN SECTION (Bilateral)     Patient location during evaluation: Mother Baby Anesthesia Type: Epidural Level of consciousness: awake and alert Pain management: pain level controlled Vital Signs Assessment: post-procedure vital signs reviewed and stable Respiratory status: spontaneous breathing, nonlabored ventilation and respiratory function stable Cardiovascular status: stable Postop Assessment: no headache, no backache and epidural receding Anesthetic complications: no   No notable events documented.  Last Vitals:  Vitals:   07/02/22 0445 07/02/22 0612  BP:    Pulse:    Resp: 18 16  Temp:  37 C  SpO2:  99%    Last Pain:  Vitals:   07/02/22 0615  TempSrc:   PainSc: 0-No pain   Pain Goal: Patients Stated Pain Goal: 0 (06/30/22 1729)                 Emali Heyward

## 2022-07-02 NOTE — Discharge Summary (Signed)
Postpartum Discharge Summary  Date of Service updated 07/03/22     Patient Name: Heidi Shields DOB: Jun 04, 1996 MRN: [id number]  Date of admission: 06/30/2022 Delivery date:07/01/2022  Delivering provider: Lynnda Shields A  Date of discharge: 07/03/2022  Admitting diagnosis: Gestational hypertension [O13.9] Intrauterine pregnancy: [redacted]w[redacted]d    Secondary diagnosis:  Principal Problem:   Delivery of pregnancy by cesarean section Active Problems:   Supervision of high-risk pregnancy   Gestational hypertension   Fetal hydronephrosis during pregnancy, antepartum   Arrest of descent, delivered, current hospitalization  Additional problems: Acute blood loss anemia - receiving PO iron    Discharge diagnosis: Term Pregnancy Delivered and Gestational Hypertension                                              Post partum procedures: None Augmentation: AROM and Pitocin Complications: None  Hospital course: Onset of Labor With Unplanned C/S   26y.o. yo G1P1001 at [id number] admitted in Latent Labor on 06/30/2022. Patient had a labor course significant for admit for SROM. Progressed to complete with forebag AROM and Pitocin. She pushed for 2 hours but did not progress past -1 station in the setting of suspected macrosomia. The patient went for cesarean section due to Arrest of Descent. Delivery details as follows: Membrane Rupture Time/Date: 2:00 PM ,06/30/2022   Delivery Method:C-Section, Low Transverse  Details of operation can be found in separate operative note.  Patient had an uncomplicated postpartum course.  Her hemoglobin on POD#1 was 9.1.  She was asymptomatic from this and was started on PO iron supplementation.  She was also started on Lasix due to her history of gHTN, which she will continue upon discharge to complete a 5 day course.  Her BP remained within normal limits while inpatient.  She is ambulating,tolerating a regular diet, passing flatus, and urinating well.  Her pain and  bleeding are controlled.  She is formula feeding well.  Patient is discharged home in stable condition 07/03/22.  Newborn Data: Birth date:07/01/2022  Birth time:11:24 PM  Gender:Female  Living status:Living  Apgars:8 ,9  Weight:4240 g   Magnesium Sulfate received: No BMZ received: No Rhophylac: N/A MMR: N/A T-DaP: Given prenatally Flu: No Transfusion: No  Physical exam  Vitals:   07/02/22 1158 07/02/22 1615 07/02/22 2050 07/03/22 0603  BP: 115/73 113/73 119/69 103/77  Pulse: 75 77 79 84  Resp: _0 Temp: 98.2 F (36.8 C) 98.5 F (36.9 C)  97.9 F (36.6 C)  TempSrc: Oral Oral  Axillary  SpO2: 100% 100%  97%  Weight:      Height:       General: alert, cooperative, and no distress Lochia: appropriate Uterine Fundus: firm and below umbilicus  Incision: healing well with no significant drainage, no significant erythema, dressing is clean, dry, and intact DVT Evaluation: trace LE edema bilaterally, no calf tenderness to palpation   Labs: Lab Results  Component Value Date   WBC 25.3 (H) 07/02/2022   HGB 9.1 (L) 07/02/2022   HCT 26.3 (L) 07/02/2022   MCV 81.2 07/02/2022   PLT 200 07/02/2022      Latest Ref Rng & Units 06/30/2022    5:05 PM  CMP  Glucose 70 - 99 mg/dL 89   BUN 6 - 20 mg/dL 9   Creatinine 0.44 - 1.00 mg/dL  0.66   Sodium 135 - 145 mmol/L 137   Potassium 3.5 - 5.1 mmol/L 4.0   Chloride 98 - 111 mmol/L 105   CO2 22 - 32 mmol/L 18   Calcium 8.9 - 10.3 mg/dL 9.3   Total Protein 6.5 - 8.1 g/dL 6.1   Total Bilirubin 0.3 - 1.2 mg/dL 0.7   Alkaline Phos 38 - 126 U/L 120   AST 15 - 41 U/L 24   ALT 0 - 44 U/L 15    Edinburgh Score:    07/03/2022   11:40 AM  Edinburgh Postnatal Depression Scale Screening Tool  I have been able to laugh and see the funny side of things. 0  I have looked forward with enjoyment to things. 0  I have blamed myself unnecessarily when things went wrong. 0  I have been anxious or worried for no good reason. 0  I have  felt scared or panicky for no good reason. 0  Things have been getting on top of me. 0  I have been so unhappy that I have had difficulty sleeping. 0  I have felt sad or miserable. 0  I have been so unhappy that I have been crying. 0  The thought of harming myself has occurred to me. 0  Edinburgh Postnatal Depression Scale Total 0    After visit meds:  Allergies as of 07/03/2022       Reactions   Prednisone Anaphylaxis   Elevated BP, HR and BS   Duloxetine Nausea Only        Medication List     STOP taking these medications    FLINSTONES GUMMIES OMEGA-3 DHA PO       TAKE these medications    Acetaminophen Extra Strength 500 MG tablet Generic drug: acetaminophen Take 2 tablets (1,000 mg total) by mouth every 6 (six) hours.   FeroSul 325 (65 FE) MG tablet Generic drug: ferrous sulfate Take 1 tablet (325 mg total) by mouth every other day. Start taking on: July 04, 2022   furosemide 20 MG tablet Commonly known as: LASIX Take 1 tablet (20 mg total) by mouth daily for 5 days.   ibuprofen 600 MG tablet Commonly known as: ADVIL Take 1 tablet (600 mg total) by mouth every 6 (six) hours.   pantoprazole 20 MG tablet Commonly known as: Protonix Take 1 tablet (20 mg total) by mouth daily.   Prenatal 27-1 MG Tabs Take 1 tablet by mouth daily at 12 noon.               Discharge Care Instructions  (From admission, onward)           Start     Ordered   07/03/22 0000  Discharge wound care:       Comments: Remove dressing 5 days after your surgery date. You can then wash the area gently with soap and water and pat dry. You will have an incision check in about 1 week.   07/03/22 1415            Discharge home in stable condition Infant Feeding: Bottle Infant Disposition: home with mother Discharge instruction: per After Visit Summary and Postpartum booklet. Activity: Advance as tolerated. Pelvic rest for 6 weeks.  Diet: routine diet Future  Appointments: Future Appointments  Date Time Provider [place]  07/11/2022 10:30 AM [facility] NURSE [facility]  08/04/2022  2:10 PM Roma Schanz, CNM [facility]   Follow up Visit: Message sent to [facility] by Dr  Beard:  Please schedule this patient for a In person postpartum visit in 6 weeks with the following provider: Any provider. Additional Postpartum F/U: Incision check 1 week and BP check 1 week  High risk pregnancy complicated by: HTN Delivery mode:  C-Section, Low Transverse  Anticipated Birth Control:  OCPs  07/03/2022 Genia Del, MD

## 2022-07-03 ENCOUNTER — Other Ambulatory Visit (HOSPITAL_COMMUNITY): Payer: Self-pay

## 2022-07-03 MED ORDER — FUROSEMIDE 20 MG PO TABS
20.0000 mg | ORAL_TABLET | Freq: Every day | ORAL | 0 refills | Status: DC
  Filled 2022-07-03: qty 5, 5d supply, fill #0

## 2022-07-03 MED ORDER — FERROUS SULFATE 325 (65 FE) MG PO TABS
325.0000 mg | ORAL_TABLET | ORAL | 0 refills | Status: DC
  Filled 2022-07-03: qty 15, 30d supply, fill #0

## 2022-07-03 MED ORDER — PRENATAL MULTIVITAMIN CH
1.0000 | ORAL_TABLET | Freq: Every day | ORAL | 0 refills | Status: DC
  Filled 2022-07-03: qty 30, 30d supply, fill #0

## 2022-07-03 MED ORDER — ACETAMINOPHEN 500 MG PO TABS
1000.0000 mg | ORAL_TABLET | Freq: Four times a day (QID) | ORAL | 0 refills | Status: DC
  Filled 2022-07-03: qty 30, 4d supply, fill #0

## 2022-07-03 MED ORDER — IBUPROFEN 600 MG PO TABS
600.0000 mg | ORAL_TABLET | Freq: Four times a day (QID) | ORAL | 0 refills | Status: DC
  Filled 2022-07-03: qty 30, 8d supply, fill #0

## 2022-07-11 ENCOUNTER — Ambulatory Visit (INDEPENDENT_AMBULATORY_CARE_PROVIDER_SITE_OTHER): Payer: 59 | Admitting: Advanced Practice Midwife

## 2022-07-11 ENCOUNTER — Encounter: Payer: Self-pay | Admitting: Advanced Practice Midwife

## 2022-07-11 VITALS — BP 105/72 | HR 93 | Ht 66.0 in | Wt 192.0 lb

## 2022-07-11 DIAGNOSIS — Z09 Encounter for follow-up examination after completed treatment for conditions other than malignant neoplasm: Secondary | ICD-10-CM

## 2022-07-11 NOTE — Progress Notes (Signed)
Subjective:  Heidi Shields is a 26 y.o. female here for BP check and incision check.  Hypertension ROS: no TIA's, no chest pain on exertion, no dyspnea on exertion, and no swelling of ankles.   Pt reports incision healing well  Objective:  LMP 10/15/2021   Appearance alert, well appearing, and in no distress and oriented to person, place, and time. Incision healing well   Assessment:   Blood Pressure today in office well controlled.  Incision healed nicely, no signs of infection, scar looks good  Plan:  Current treatment plan is effective, no change in therapy.Marland Kitchen

## 2022-07-11 NOTE — Progress Notes (Signed)
GYN VISIT Patient name: Heidi Shields MRN [id number]  Date of birth: 03/20/1996 Chief Complaint:   Blood Pressure Check and Wound Check  History of Present Illness:   Heidi Shields is a 26 y.o. G72P1001 Caucasian female being seen today for postop incision and BP check; pLTCS on 07/01/22. Doing well! Bottlefeeding; has had 'fullness' behind ears/neck since delivery; no pain.     Patient's last menstrual period was 10/15/2021. The current method of family planning is abstinence.  Last pap August 2021. Results were: NILM w/ HRHPV negative     04/28/2022    9:11 AM 01/02/2022    3:05 PM 02/15/2021    8:34 AM 07/24/2020    1:42 PM 08/20/2018   10:04 AM  Depression screen PHQ 2/9  Decreased Interest 0 0 0 0 0  Down, Depressed, Hopeless 0 0 0 0 0  PHQ - 2 Score 0 0 0 0 0  Altered sleeping 0 0  0   Tired, decreased energy 0 0  0   Change in appetite 0 1  0   Feeling bad or failure about yourself  0 0  0   Trouble concentrating 0 0  0   Moving slowly or fidgety/restless 0 0  0   Suicidal thoughts 0 0  0   PHQ-9 Score 0 1  0   Difficult doing work/chores    Not difficult at all         04/28/2022    9:12 AM 01/02/2022    3:05 PM 07/24/2020    1:43 PM  GAD 7 : Generalized Anxiety Score  Nervous, Anxious, on Edge 0 0 0  Control/stop worrying 0 0 0  Worry too much - different things 0 0 0  Trouble relaxing 0 0 0  Restless 0 0 0  Easily annoyed or irritable 0 0 0  Afraid - awful might happen 0 0 0  Total GAD 7 Score 0 0 0  Anxiety Difficulty   Not difficult at all     Review of Systems:   Pertinent items are noted in HPI Denies fever/chills, dizziness, headaches, visual disturbances, fatigue, shortness of breath, chest pain, abdominal pain, vomiting, abnormal vaginal discharge/itching/odor/irritation, problems with periods, bowel movements, urination, or intercourse unless otherwise stated above.  Pertinent History Reviewed:  Reviewed past medical,surgical, social, obstetrical  and family history.  Reviewed problem list, medications and allergies. Physical Assessment:   Vitals:   07/11/22 1026  BP: 105/72  Pulse: 93  Weight: 192 lb (87.1 kg)  Height: 5\' 6"  (1.676 m)  Body mass index is 30.99 kg/m.       Physical Examination:   General appearance: alert, well appearing, and in no distress  Mental status: alert, oriented to person, place, and time  Skin: warm & dry   Cardiovascular: normal heart rate noted  Respiratory: normal respiratory effort, no distress  Abdomen: soft, non-tender   Pelvic: examination not indicated  Extremities: no edema    No results found for this or any previous visit (from the past 24 hour(s)).  Assessment & Plan:  1) s/p pLTCS x 10d ago> healing well  2) gHTN> didn't require PP antihypertensives, BP nl today  3) 'Fullness' behind ears/neck> rec ibuprofen to help drain E tubes; prob edema from extra fluids; see PCP if s/s otitis  Meds: No orders of the defined types were placed in this encounter.   No orders of the defined types were placed in this encounter.   Return for  As scheduled.  Arabella Merles CNM 07/11/2022 10:43 AM

## 2022-07-24 ENCOUNTER — Encounter: Payer: Self-pay | Admitting: Family Medicine

## 2022-07-25 ENCOUNTER — Ambulatory Visit: Payer: 59 | Admitting: Family Medicine

## 2022-07-25 VITALS — BP 107/63 | HR 86 | Temp 98.0°F | Ht 66.0 in | Wt 192.4 lb

## 2022-07-25 DIAGNOSIS — R300 Dysuria: Secondary | ICD-10-CM

## 2022-07-25 DIAGNOSIS — N3001 Acute cystitis with hematuria: Secondary | ICD-10-CM

## 2022-07-25 LAB — POCT URINALYSIS DIPSTICK
Bilirubin, UA: NEGATIVE
Blood, UA: POSITIVE
Glucose, UA: NEGATIVE
Ketones, UA: NEGATIVE
Nitrite, UA: NEGATIVE
Protein, UA: POSITIVE — AB
Spec Grav, UA: 1.015 (ref 1.010–1.025)
Urobilinogen, UA: 1 E.U./dL
pH, UA: 6.5 (ref 5.0–8.0)

## 2022-07-25 MED ORDER — SULFAMETHOXAZOLE-TRIMETHOPRIM 800-160 MG PO TABS: 1.0000 | ORAL_TABLET | Freq: Two times a day (BID) | ORAL | 0 refills | Status: DC

## 2022-07-25 NOTE — Progress Notes (Signed)
OFFICE VISIT  07/25/2022  CC:  Chief Complaint  Patient presents with   Urinary Concerns    Discomfort when urinating   Patient is a 26 y.o. female who presents for urinary concern.  HPI: Heidi Shields has discomfort at the end of each void since delivering a baby boy 06/30/2022, prolonged labor, C-section [redacted] weeks gestation, hypertension.  No magnesium. Sounds like complicated Foley catheter insertion, some blood in urine.  No blood in urine since going home.  No unusual urgency or frequency. No burning with urination just some discomfort in the general GU region. She does still have a little bit of brownish tinged vaginal bleeding.  No nausea, no fever, no flank pain, no abdominal pain  Past Medical History:  Diagnosis Date   Acne vulgaris    Anxiety and depression    Chlamydia 07/21/2017   + at GYN screening age 23.  Test of cure done and was negative (Dr. Despina Hidden).   Hay fever    History of mononucleosis syndrome 2012   Irregular menstrual cycle     Past Surgical History:  Procedure Laterality Date   CESAREAN SECTION Bilateral 07/01/2022   Procedure: CESAREAN SECTION;  Surgeon: Warden Fillers, MD;  Location: [facility] LD [facility]S;  Service: Obstetrics;  Laterality: Bilateral;   TOE SURGERY     age 54    Outpatient Medications Prior to Visit  Medication Sig Dispense Refill   acetaminophen (TYLENOL) 500 MG tablet Take 2 tablets (1,000 mg total) by mouth every 6 (six) hours. (Patient not taking: Reported on 07/25/2022) 30 tablet 0   ferrous sulfate 325 (65 FE) MG tablet Take 1 tablet (325 mg total) by mouth every other day. (Patient not taking: Reported on 07/25/2022) 15 tablet 0   ibuprofen (ADVIL) 600 MG tablet Take 1 tablet (600 mg total) by mouth every 6 (six) hours. (Patient not taking: Reported on 07/25/2022) 30 tablet 0   Prenatal Vit-Fe Fumarate-FA (PRENATAL MULTIVITAMIN) TABS tablet Take 1 tablet by mouth daily at 12 noon. (Patient not taking: Reported on 07/25/2022) 30 tablet 0   No  facility-administered medications prior to visit.    Allergies  Allergen Reactions   Prednisone Anaphylaxis    Elevated BP, HR and BS   Duloxetine Nausea Only    ROS As per HPI  PE:    07/25/2022    1:05 PM 07/11/2022   10:26 AM 07/03/2022    6:03 AM  Vitals with BMI  Height 5\' 6"  5\' 6"    Weight 192 lbs 6 oz 192 lbs   BMI 31.07 31   Systolic 107 105  Diastolic 63 72 77  Pulse 86 93 84     Physical Exam  Gen: Alert, well appearing.  Patient is oriented to person, place, time, and situation. AFFECT: pleasant, lucid thought and speech. No further exam today  LABS:  Last CBC Lab Results  Component Value Date   WBC 25.3 (H) 07/02/2022   HGB 9.1 (L) 07/02/2022   HCT 26.3 (L) 07/02/2022   MCV 81.2 07/02/2022   MCH 28.1 07/02/2022   RDW 13.3 07/02/2022   PLT 200 07/02/2022   Last metabolic panel Lab Results  Component Value Date   GLUCOSE 89 06/30/2022   NA 137 06/30/2022   K 4.0 06/30/2022   CL 105 06/30/2022   CO2 18 (L) 06/30/2022   BUN 9 06/30/2022   CREATININE 0.66 06/30/2022   GFRNONAA >60 06/30/2022   CALCIUM 9.3 06/30/2022   PROT 6.1 (L) 06/30/2022  ALBUMIN 2.9 (L) 06/30/2022   LABGLOB 2.7 06/24/2022   AGRATIO 1.4 06/24/2022   BILITOT 0.7 06/30/2022   ALKPHOS 120 06/30/2022   AST 24 06/30/2022   ALT 15 06/30/2022   ANIONGAP 14 06/30/2022   POC dipstick UA today: 3+ blood, 1+ LEU, o/w normal  IMPRESSION AND PLAN:  Voiding discomfort. History of bladder mucosal injury from Foley catheter in labor about 8 weeks ago. Some of he ongoing discomfort can be from this still healing up but will treat for bladder infection as well.  Given duration of symptoms I will go ahead and give 7 days of Bactrim. Urine sent for culture and sensitivity today. She states she has follow-up with he GYN provider pretty soon and states she can get a follow-up urinalysis to make sure her hematuria has cleared  at that time.  An After Visit Summary was printed and  given to the patient.  FOLLOW UP: Return if symptoms worsen or fail to improve.  Signed:  Santiago Bumpers, MD           07/25/2022

## 2022-07-26 LAB — URINE CULTURE
MICRO NUMBER:: 13768418
SPECIMEN QUALITY:: ADEQUATE

## 2022-08-04 ENCOUNTER — Encounter: Payer: Self-pay | Admitting: Women's Health

## 2022-08-04 ENCOUNTER — Ambulatory Visit (INDEPENDENT_AMBULATORY_CARE_PROVIDER_SITE_OTHER): Payer: 59 | Admitting: Women's Health

## 2022-08-04 DIAGNOSIS — Z30011 Encounter for initial prescription of contraceptive pills: Secondary | ICD-10-CM

## 2022-08-04 MED ORDER — LO LOESTRIN FE 1 MG-10 MCG / 10 MCG PO TABS: 1.0000 | ORAL_TABLET | Freq: Every day | ORAL | 3 refills | Status: DC

## 2022-08-04 NOTE — Progress Notes (Signed)
POSTPARTUM VISIT Patient name: Heidi Shields MRN [id number]  Date of birth: 04-Jan-1996 Chief Complaint:   Postpartum Care  History of Present Illness:   Heidi Shields is a 26 y.o. G107P1001 Caucasian female being seen today for a postpartum visit. She is 4 weeks postpartum following a primary cesarean section, low transverse incision at 37.1 gestational weeks after SROM @ 36.6wks, failure to descend after 2hrs pushing- remained -1 station. IOL: no, for n/a. Anesthesia: epidural.  Laceration: n/a.  Complications: none. Inpatient contraception: no.   Pregnancy complicated by [facility] . Tobacco use: no. Substance use disorder: no. Last pap smear: 07/24/20 and results were  neg . Next pap smear due: 2024 No LMP recorded.  Postpartum course has been uncomplicated. Bleeding  on period now . Bowel function is normal. Bladder function is  some burning, went to PCP, rx'd antibiotics, urine cx neg, thinks may be from foley cath . Urinary incontinence? no, fecal incontinence? no Patient is not sexually active. Last sexual activity: prior to birth of baby. Desired contraception: OCPs. Patient does want a pregnancy in the future.  Desired family size is 2 children.   Upstream - 08/04/22 1407       Pregnancy Intention Screening   Does the patient want to become pregnant in the next year? No    Does the patient's partner want to become pregnant in the next year? No    Would the patient like to discuss contraceptive options today? Yes      Contraception Wrap Up   Current Method Abstinence    Contraception Counseling Provided Yes            The pregnancy intention screening data noted above was reviewed. Potential methods of contraception were discussed. The patient elected to proceed with No data recorded.  Edinburgh Postpartum Depression Screening: negative  Edinburgh Postnatal Depression Scale - 08/04/22 1407       Edinburgh Postnatal Depression Scale:  In the Past 7 Days   I have been able  to laugh and see the funny side of things. 0    I have looked forward with enjoyment to things. 0    I have blamed myself unnecessarily when things went wrong. 0    I have been anxious or worried for no good reason. 0    I have felt scared or panicky for no good reason. 0    Things have been getting on top of me. 0    I have been so unhappy that I have had difficulty sleeping. 0    I have felt sad or miserable. 0    I have been so unhappy that I have been crying. 0    The thought of harming myself has occurred to me. 0    Edinburgh Postnatal Depression Scale Total 0                04/28/2022    9:12 AM 01/02/2022    3:05 PM 07/24/2020    1:43 PM  GAD 7 : Generalized Anxiety Score  Nervous, Anxious, on Edge 0 0 0  Control/stop worrying 0 0 0  Worry too much - different things 0 0 0  Trouble relaxing 0 0 0  Restless 0 0 0  Easily annoyed or irritable 0 0 0  Afraid - awful might happen 0 0 0  Total GAD 7 Score 0 0 0  Anxiety Difficulty   Not difficult at all     Baby's course has been uncomplicated.  Baby is feeding by bottle. Infant has a pediatrician/family doctor? Yes.  Childcare strategy if returning to work/school: n/a-stay at home mom.  Pt has material needs met for her and baby: Yes.   Review of Systems:   Pertinent items are noted in HPI Denies Abnormal vaginal discharge w/ itching/odor/irritation, headaches, visual changes, shortness of breath, chest pain, abdominal pain, severe nausea/vomiting, or problems with urination or bowel movements. Pertinent History Reviewed:  Reviewed past medical,surgical, obstetrical and family history.  Reviewed problem list, medications and allergies. OB History  Gravida Para Term Preterm AB Living  $Remov'1 1 1 'HBXYyP$ 0 0 1  SAB IAB Ectopic Multiple Live Births  0 0 0 0 1    # Outcome Date GA Lbr Len/2nd Weight Sex Delivery Anes PTL Lv  1 Term 07/01/22 [redacted]w[redacted]d 31:30 / 01:54 9 lb 5.6 oz (4.24 kg) M CS-LTranv EPI  LIV   Physical Assessment:    Vitals:   08/04/22 1406  BP: 115/67  Pulse: 69  Weight: 191 lb 9.6 oz (86.9 kg)  Body mass index is 30.93 kg/m.       Physical Examination:   General appearance: alert, well appearing, and in no distress  Mental status: alert, oriented to person, place, and time  Skin: warm & dry   Cardiovascular: normal heart rate noted   Respiratory: normal respiratory effort, no distress   Breasts: deferred, no complaints   Abdomen: soft, non-tender, c/s incision well healed  Pelvic: examination not indicated. Thin prep pap obtained: No  Rectal: not examined  Extremities: Edema: none   Chaperone: N/A         No results found for this or any previous visit (from the past 24 hour(s)).  Assessment & Plan:  1) Postpartum exam 2) 4 wks s/p primary cesarean section, low transverse incision d/t AOD 3) bottle feeding 4) Depression screening 5) Contraception management: rx LoLoestrin, f/u 23mths, condoms x 2wks  Essential components of care per ACOG recommendations:  1.  Mood and well being:  If positive depression screen, discussed and plan developed.  If using tobacco we discussed reduction/cessation and risk of relapse If current substance abuse, we discussed and referral to local resources was offered.   2. Infant care and feeding:  If breastfeeding, discussed returning to work, pumping, breastfeeding-associated pain, guidance regarding return to fertility while lactating if not using another method. If needed, patient was provided with a letter to be allowed to pump q 2-3hrs to support lactation in a private location with access to a refrigerator to store breastmilk.   Recommended that all caregivers be immunized for flu, pertussis and other preventable communicable diseases If pt does not have material needs met for her/baby, referred to local resources for help obtaining these.  3. Sexuality, contraception and birth spacing Provided guidance regarding sexuality, management of  dyspareunia, and resumption of intercourse Discussed avoiding interpregnancy interval <66mths and recommended birth spacing of 18 months  4. Sleep and fatigue Discussed coping options for fatigue and sleep disruption Encouraged family/partner/community support of 4 hrs of uninterrupted sleep to help with mood and fatigue  5. Physical recovery  If pt had a C/S, assessed incisional pain and providing guidance on normal vs prolonged recovery If pt had a laceration, perineal healing and pain reviewed.  If urinary or fecal incontinence, discussed management and referred to PT or uro/gyn if indicated  Patient is safe to resume physical activity. Discussed attainment of healthy weight.  6.  Chronic disease management Discussed pregnancy complications if any,  and their implications for future childbearing and long-term maternal health. Review recommendations for prevention of recurrent pregnancy complications, such as 17 hydroxyprogesterone caproate to reduce risk for recurrent PTB not applicable, or aspirin to reduce risk of preeclampsia yes. Pt had GDM: no. If yes, 2hr GTT scheduled: not applicable. Reviewed medications and non-pregnant dosing including consideration of whether pt is breastfeeding using a reliable resource such as LactMed: not applicable Referred for f/u w/ PCP or subspecialist providers as indicated: not applicable  7. Health maintenance Mammogram at 27yo or earlier if indicated Pap smears as indicated  Meds: No orders of the defined types were placed in this encounter.   Follow-up: Return in about 1 year (around 08/05/2023) for Pap & physical.   No orders of the defined types were placed in this encounter.   [place] 08/04/2022 2:53 PM

## 2022-09-12 ENCOUNTER — Telehealth: Payer: 59 | Admitting: Emergency Medicine

## 2022-09-12 DIAGNOSIS — R051 Acute cough: Secondary | ICD-10-CM | POA: Diagnosis not present

## 2022-09-12 MED ORDER — BENZONATATE 100 MG PO CAPS: 100.0000 mg | ORAL_CAPSULE | Freq: Two times a day (BID) | ORAL | 0 refills | Status: DC | PRN

## 2022-09-12 MED ORDER — AZITHROMYCIN 250 MG PO TABS: ORAL_TABLET | ORAL | 0 refills | Status: DC

## 2022-09-12 NOTE — Progress Notes (Signed)
We are sorry that you are not feeling well.  Here is how we plan to help!  Based on your presentation I believe you most likely have A cough due to bacteria.  When patients have a fever and a productive cough with a change in color or increased sputum production, we are concerned about bacterial bronchitis.  If left untreated it can progress to pneumonia.  If your symptoms do not improve with your treatment plan it is important that you contact your provider.   I have prescribed Azithromyin 250 mg: two tablets now and then one tablet daily for 4 additonal days    In addition you may use A prescription cough medication called Tessalon Perles 100mg. You may take 1-2 capsules every 8 hours as needed for your cough.    From your responses in the eVisit questionnaire you describe inflammation in the upper respiratory tract which is causing a significant cough.  This is commonly called Bronchitis and has four common causes:   Allergies Viral Infections Acid Reflux Bacterial Infection Allergies, viruses and acid reflux are treated by controlling symptoms or eliminating the cause. An example might be a cough caused by taking certain blood pressure medications. You stop the cough by changing the medication. Another example might be a cough caused by acid reflux. Controlling the reflux helps control the cough.     HOME CARE Only take medications as instructed by your medical team. Complete the entire course of an antibiotic. Drink plenty of fluids and get plenty of rest. Avoid close contacts especially the very young and the elderly Cover your mouth if you cough or cough into your sleeve. Always remember to wash your hands A steam or ultrasonic humidifier can help congestion.   GET HELP RIGHT AWAY IF: You develop worsening fever. You become short of breath You cough up blood. Your symptoms persist after you have completed your treatment plan MAKE SURE YOU  Understand these instructions. Will  watch your condition. Will get help right away if you are not doing well or get worse.    Thank you for choosing an e-visit.  Your e-visit answers were reviewed by a board certified advanced clinical practitioner to complete your personal care plan. Depending upon the condition, your plan could have included both over the counter or prescription medications.  Please review your pharmacy choice. Make sure the pharmacy is open so you can pick up prescription now. If there is a problem, you may contact your provider through MyChart messaging and have the prescription routed to another pharmacy.  Your safety is important to us. If you have drug allergies check your prescription carefully.   For the next 24 hours you can use MyChart to ask questions about today's visit, request a non-urgent call back, or ask for a work or school excuse. You will get an email in the next two days asking about your experience. I hope that your e-visit has been valuable and will speed your recovery.  Approximately 5 minutes was used in reviewing the patient's chart, questionnaire, prescribing medications, and documentation.  

## 2022-11-04 ENCOUNTER — Ambulatory Visit: Payer: Self-pay | Admitting: Women's Health

## 2023-04-06 ENCOUNTER — Telehealth: Payer: Self-pay | Admitting: Physician Assistant

## 2023-04-06 DIAGNOSIS — J208 Acute bronchitis due to other specified organisms: Secondary | ICD-10-CM

## 2023-04-06 DIAGNOSIS — B9689 Other specified bacterial agents as the cause of diseases classified elsewhere: Secondary | ICD-10-CM

## 2023-04-06 MED ORDER — BENZONATATE 100 MG PO CAPS: 100.0000 mg | ORAL_CAPSULE | Freq: Two times a day (BID) | ORAL | 0 refills | Status: DC | PRN

## 2023-04-06 MED ORDER — AZITHROMYCIN 250 MG PO TABS: ORAL_TABLET | ORAL | 0 refills | Status: DC

## 2023-04-06 NOTE — Progress Notes (Signed)
E-Visit for Cough  We are sorry that you are not feeling well.  Here is how we plan to help!  Based on your presentation I believe you most likely have A cough due to bacteria.  When patients have a fever and a productive cough with a change in color or increased sputum production, we are concerned about bacterial bronchitis.  If left untreated it can progress to pneumonia.  If your symptoms do not improve with your treatment plan it is important that you contact your provider.   I have prescribed Azithromyin 250 mg: two tablets now and then one tablet daily for 4 additonal days    In addition you may use A prescription cough medication called Tessalon Perles 100mg . You may take 1-2 capsules every 8 hours as needed for your cough.   From your responses in the eVisit questionnaire you describe inflammation in the upper respiratory tract which is causing a significant cough.  This is commonly called Bronchitis and has four common causes:   Allergies Viral Infections Acid Reflux Bacterial Infection Allergies, viruses and acid reflux are treated by controlling symptoms or eliminating the cause. An example might be a cough caused by taking certain blood pressure medications. You stop the cough by changing the medication. Another example might be a cough caused by acid reflux. Controlling the reflux helps control the cough.  USE OF BRONCHODILATOR ("RESCUE") INHALERS: There is a risk from using your bronchodilator too frequently.  The risk is that over-reliance on a medication which only relaxes the muscles surrounding the breathing tubes can reduce the effectiveness of medications prescribed to reduce swelling and congestion of the tubes themselves.  Although you feel brief relief from the bronchodilator inhaler, your asthma may actually be worsening with the tubes becoming more swollen and filled with mucus.  This can delay other crucial treatments, such as oral steroid medications. If you need to use a  bronchodilator inhaler daily, several times per day, you should discuss this with your provider.  There are probably better treatments that could be used to keep your asthma under control.     HOME CARE Only take medications as instructed by your medical team. Complete the entire course of an antibiotic. Drink plenty of fluids and get plenty of rest. Avoid close contacts especially the very young and the elderly Cover your mouth if you cough or cough into your sleeve. Always remember to wash your hands A steam or ultrasonic humidifier can help congestion.   GET HELP RIGHT AWAY IF: You develop worsening fever. You become short of breath You cough up blood. Your symptoms persist after you have completed your treatment plan MAKE SURE YOU  Understand these instructions. Will watch your condition. Will get help right away if you are not doing well or get worse.    Thank you for choosing an e-visit.  Your e-visit answers were reviewed by a board certified advanced clinical practitioner to complete your personal care plan. Depending upon the condition, your plan could have included both over the counter or prescription medications.  Please review your pharmacy choice. Make sure the pharmacy is open so you can pick up prescription now. If there is a problem, you may contact your provider through [employer] and have the prescription routed to another pharmacy.  Your safety is important to [name]. If you have drug allergies check your prescription carefully.   For the next 24 hours you can use MyChart to ask questions about today's visit, request a non-urgent  call back, or ask for a work or school excuse. You will get an email in the next two days asking about your experience. I hope that your e-visit has been valuable and will speed your recovery.

## 2023-04-06 NOTE — Progress Notes (Signed)
I have spent 5 minutes in review of e-visit questionnaire, review and updating patient chart, medical decision making and response to patient.   Erma Raiche Cody Christianna Belmonte, PA-C    

## 2023-11-18 ENCOUNTER — Telehealth: Payer: 59 | Admitting: Physician Assistant

## 2023-11-18 DIAGNOSIS — B9689 Other specified bacterial agents as the cause of diseases classified elsewhere: Secondary | ICD-10-CM | POA: Diagnosis not present

## 2023-11-18 DIAGNOSIS — J019 Acute sinusitis, unspecified: Secondary | ICD-10-CM | POA: Diagnosis not present

## 2023-11-19 MED ORDER — AMOXICILLIN-POT CLAVULANATE 875-125 MG PO TABS: 1.0000 | ORAL_TABLET | Freq: Two times a day (BID) | ORAL | 0 refills | Status: DC

## 2023-11-19 NOTE — Progress Notes (Signed)

## 2024-01-04 ENCOUNTER — Encounter: Payer: Self-pay | Admitting: Obstetrics & Gynecology

## 2024-01-04 ENCOUNTER — Telehealth: Payer: Commercial Managed Care - PPO | Admitting: Obstetrics & Gynecology

## 2024-01-04 DIAGNOSIS — Z3189 Encounter for other procreative management: Secondary | ICD-10-CM

## 2024-01-04 NOTE — Progress Notes (Signed)
MyChart connect visit: audio + video Patient appears to be at home I am in my office Total time 15 minutes   Follow up appointment Fertility questions  Chief Complaint  Patient presents with   Follow-up    Discuss TTC    not currently breastfeeding.      01/04/24 10:44 AM This message is for Dr. Despina Hidden.  I've been TTC for 10 months now. I do the ovulation strips, and can tell with egg white discharge during fertile window. I've still been taking my prenatals since having River. He is 18 months now. I'm not sure if it's anything else I need to be doing? I guess I'm paranoid something isn't right since it's been several months of trying now.  I know they say average is trying a year, but just wanting your advise as the year mark is approaching.   Confirmed patient is performing OPK tests appropriately and having timed intercourse at proper time  She has an 18 month at home, tooke her 8 months to get pregnancy before  No history of PID or surgery or other major medical problems since delivery  FOB healthy without intervening medical issues   No pain with intercourse, menses are as they have been no change Timing is correct each month with menstruation  MEDS ordered this encounter: No orders of the defined types were placed in this encounter.   Orders for this encounter: No orders of the defined types were placed in this encounter.   Impression + Management Plan   ICD-10-CM   1. Encounter for fertility planning: has 72 month old child, trying 10 months total  Z31.89    For now I do not have any specific recs, pt is doing everything perfectly.  If it continues I would do sperm count initially, no female related issues it seems      Follow Up: Return if symptoms worsen or fail to improve.     All questions were answered.  Past Medical History:  Diagnosis Date   Acne vulgaris    Anxiety and depression    Chlamydia 07/21/2017   + at GYN screening age 63.  Test of cure  done and was negative (Dr. Despina Hidden).   Hay fever    History of mononucleosis syndrome 2012   Irregular menstrual cycle    PIH (pregnancy induced hypertension)     Past Surgical History:  Procedure Laterality Date   CESAREAN SECTION Bilateral 07/01/2022   Procedure: CESAREAN SECTION;  Surgeon: Warden Fillers, MD;  Location: [facility] LD ORS;  Service: Obstetrics;  Laterality: Bilateral;   TOE SURGERY     age 64    OB History     Gravida  1   Para  1   Term  1   Preterm  0   AB  0   Living  1      SAB  0   IAB  0   Ectopic  0   Multiple  0   Live Births  1           Allergies  Allergen Reactions   Prednisone Anaphylaxis    Elevated BP, HR and BS   Duloxetine Nausea Only    Social History   Socioeconomic History   Marital status: Married    Spouse name: Not on file   Number of children: Not on file   Years of education: Not on file   Highest education level: Some college, no degree  Occupational History  Not on file  Tobacco Use   Smoking status: Never   Smokeless tobacco: Never  Vaping Use   Vaping status: Never Used  Substance and Sexual Activity   Alcohol use: Not Currently    Comment: occ   Drug use: No   Sexual activity: Yes    Birth control/protection: None  Other Topics Concern   Not on file  Social History Narrative   Married 2021, had a baby boy by C-section July 2023.   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   No T/A/Ds.   Social Drivers of Corporate investment banker Strain: Low Risk  (07/25/2022)   Overall Financial Resource Strain (CARDIA)    Difficulty of Paying Living Expenses: Not hard at all  Food Insecurity: No Food Insecurity (07/25/2022)   Hunger Vital Sign    Worried About Running Out of Food in the Last Year: Never true    Ran Out of Food in the Last Year: Never true  Transportation Needs: No Transportation Needs (07/25/2022)   PRAPARE - Administrator, Civil Service (Medical): No    Lack of  Transportation (Non-Medical): No  Physical Activity: Insufficiently Active (07/25/2022)   Exercise Vital Sign    Days of Exercise per Week: 3 days    Minutes of Exercise per Session: 30 min  Stress: No Stress Concern Present (07/25/2022)   [employer] of Occupational Health - Occupational Stress Questionnaire    Feeling of Stress : Not at all  Social Connections: Moderately Integrated (07/25/2022)   Social Connection and Isolation Panel [NHANES]    Frequency of Communication with Friends and Family: More than three times a week    Frequency of Social Gatherings with Friends and Family: More than three times a week    Attends Religious Services: More than 4 times per year    Active Member of [employer] or Organizations: No    Attends Engineer, structural: Not on file    Marital Status: Married    Family History  Problem Relation Age of Onset   Diabetes Father    Thyroid disease Sister    Cirrhosis Maternal Grandmother    High blood pressure Paternal Grandmother    High blood pressure Paternal Grandfather    Breast cancer Other        Aunt

## 2024-03-07 ENCOUNTER — Encounter: Payer: Self-pay | Admitting: Obstetrics & Gynecology

## 2024-04-14 ENCOUNTER — Encounter: Admitting: Family Medicine

## 2024-05-10 ENCOUNTER — Encounter: Payer: Self-pay | Admitting: Family Medicine

## 2024-05-10 ENCOUNTER — Ambulatory Visit (INDEPENDENT_AMBULATORY_CARE_PROVIDER_SITE_OTHER): Admitting: Family Medicine

## 2024-05-10 VITALS — BP 105/73 | HR 70 | Temp 98.1°F | Ht 64.25 in | Wt 185.4 lb

## 2024-05-10 DIAGNOSIS — Z0000 Encounter for general adult medical examination without abnormal findings: Secondary | ICD-10-CM

## 2024-05-10 MED ORDER — LEVOTHYROXINE SODIUM 25 MCG PO TABS: 25.0000 ug | ORAL_TABLET | Freq: Every day | ORAL | Status: DC

## 2024-05-10 NOTE — Patient Instructions (Signed)

## 2024-05-10 NOTE — Progress Notes (Signed)
 Office Note 05/10/2024  CC:  Chief Complaint  Patient presents with   Annual Exam    Pt is not fasting    HPI:  Patient is a 28 y.o. female who is here for annual health maintenance exam. Feeling well.  She is undergoing infertility evaluation.  She has been trying to get pregnant for over a year.  She has a son that is 28 years old. The infertility clinic put her on levothyroxine 25 mcg, stating that they recommend a person's TSH be around 1 to have better chance with IVF.  She reports that hers was about 2.5 when they checked it in early April and put her on levothyroxine. She states her menses occur regularly and have a normal flow.   Past Medical History:  Diagnosis Date   Acne vulgaris    Anxiety and depression    Chlamydia 07/21/2017   + at GYN screening age 21.  Test of cure done and was negative (Dr. Randolm Butte).   Hay fever    History of mononucleosis syndrome 2012   Irregular menstrual cycle    PIH (pregnancy induced hypertension)     Past Surgical History:  Procedure Laterality Date   CESAREAN SECTION Bilateral 07/01/2022   Procedure: CESAREAN SECTION;  Surgeon: Abigail Abler, MD;  Location: [facility];  Service: Obstetrics;  Laterality: Bilateral;   TOE SURGERY     age 44    Family History  Problem Relation Age of Onset   Diabetes Father    Thyroid  disease Sister    Cirrhosis Maternal Grandmother    High blood pressure Paternal Grandmother    High blood pressure Paternal Grandfather    Breast cancer Other        Aunt    Social History   Socioeconomic History   Marital status: Married    Spouse name: Not on file   Number of children: Not on file   Years of education: Not on file   Highest education level: Some college, no degree  Occupational History   Not on file  Tobacco Use   Smoking status: Never   Smokeless tobacco: Never  Vaping Use   Vaping status: Never Used  Substance and Sexual Activity   Alcohol use: Not Currently    Comment: occ    Drug use: No   Sexual activity: Yes    Birth control/protection: None  Other Topics Concern   Not on file  Social History Narrative   Married 2021, had a baby boy by C-section July 2023.   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   No T/A/Ds.   Social Drivers of Corporate investment banker Strain: Low Risk  (05/09/2024)   Overall Financial Resource Strain (CARDIA)    Difficulty of Paying Living Expenses: Not hard at all  Food Insecurity: No Food Insecurity (05/09/2024)   Hunger Vital Sign    Worried About Running Out of Food in the Last Year: Never true    Ran Out of Food in the Last Year: Never true  Transportation Needs: No Transportation Needs (05/09/2024)   PRAPARE - Administrator, Civil Service (Medical): No    Lack of Transportation (Non-Medical): No  Physical Activity: Insufficiently Active (05/09/2024)   Exercise Vital Sign    Days of Exercise per Week: 3 days    Minutes of Exercise per Session: 30 min  Stress: No Stress Concern Present (05/09/2024)   [employer] of Occupational Health - Occupational Stress Questionnaire  Feeling of Stress : Not at all  Social Connections: Moderately Integrated (05/09/2024)   Social Connection and Isolation Panel [NHANES]    Frequency of Communication with Friends and Family: More than three times a week    Frequency of Social Gatherings with Friends and Family: More than three times a week    Attends Religious Services: More than 4 times per year    Active Member of [employer] or Organizations: No    Attends Banker Meetings: Not on file    Marital Status: Married  Catering manager Violence: Not At Risk (07/24/2020)   Humiliation, Afraid, Rape, and Kick questionnaire    Fear of Current or Ex-Partner: No    Emotionally Abused: No    Physically Abused: No    Sexually Abused: No    Outpatient Medications Prior to Visit  Medication Sig Dispense Refill   Prenatal Vit-Fe Fumarate-FA (MULTIVITAMIN-PRENATAL)  27-0.8 MG TABS tablet Take 1 tablet by mouth daily at 12 noon.     No facility-administered medications prior to visit.    Allergies  Allergen Reactions   Prednisone  Anaphylaxis    Elevated BP, HR and BS   Duloxetine  Nausea Only    Review of Systems  Constitutional:  Negative for appetite change, chills, fatigue and fever.  HENT:  Negative for congestion, dental problem, ear pain and sore throat.   Eyes:  Negative for discharge, redness and visual disturbance.  Respiratory:  Negative for cough, chest tightness, shortness of breath and wheezing.   Cardiovascular:  Negative for chest pain, palpitations and leg swelling.  Gastrointestinal:  Negative for abdominal pain, blood in stool, diarrhea, nausea and vomiting.  Genitourinary:  Negative for difficulty urinating, dysuria, flank pain, frequency, hematuria and urgency.  Musculoskeletal:  Negative for arthralgias, back pain, joint swelling, myalgias and neck stiffness.  Skin:  Negative for pallor and rash.  Neurological:  Negative for dizziness, speech difficulty, weakness and headaches.  Hematological:  Negative for adenopathy. Does not bruise/bleed easily.  Psychiatric/Behavioral:  Negative for confusion and sleep disturbance. The patient is not nervous/anxious.     PE;    05/10/2024    8:19 AM 08/04/2022    2:06 PM 07/25/2022    1:05 PM  Vitals with BMI  Height 5' 4.25"  5\' 6"   Weight 185 lbs 6 oz 191 lbs 10 oz 192 lbs 6 oz  BMI 31.57 30.94 31.07  Systolic 105 115 272  Diastolic 73 67 63  Pulse 70 69 86    Gen: Alert, well appearing.  Patient is oriented to person, place, time, and situation. AFFECT: pleasant, lucid thought and speech. ENT: Ears: EACs clear, normal epithelium.  TMs with good light reflex and landmarks bilaterally.  Eyes: no injection, icteris, swelling, or exudate.  EOMI, PERRLA. Nose: no drainage or turbinate edema/swelling.  No injection or focal lesion.  Mouth: lips without lesion/swelling.  Oral mucosa  pink and moist.  Dentition intact and without obvious caries or gingival swelling.  Oropharynx without erythema, exudate, or swelling.  Neck: supple/nontender.  No LAD, mass, or TM.  Carotid pulses 2+ bilaterally, without bruits. CV: RRR, no m/r/g.   LUNGS: CTA bilat, nonlabored resps, good aeration in all lung fields. ABD: soft, NT, ND, BS normal.  No hepatospenomegaly or mass.  No bruits. EXT: no clubbing, cyanosis, or edema.  Musculoskeletal: no joint swelling, erythema, warmth, or tenderness.  ROM of all joints intact. Skin - no sores or suspicious lesions or rashes or color changes  Pertinent labs:  Lab  Results  Component Value Date   TSH 1.940 02/15/2021   Lab Results  Component Value Date   WBC 25.3 (H) 07/02/2022   HGB 9.1 (L) 07/02/2022   HCT 26.3 (L) 07/02/2022   MCV 81.2 07/02/2022   PLT 200 07/02/2022   Lab Results  Component Value Date   CREATININE 0.66 06/30/2022   BUN 9 06/30/2022   NA 137 06/30/2022   K 4.0 06/30/2022   CL 105 06/30/2022   CO2 18 (L) 06/30/2022   Lab Results  Component Value Date   ALT 15 06/30/2022   AST 24 06/30/2022   ALKPHOS 120 06/30/2022   BILITOT 0.7 06/30/2022   Lab Results  Component Value Date   CHOL 146 02/15/2021   Lab Results  Component Value Date   HDL 47 02/15/2021   Lab Results  Component Value Date   LDLCALC 73 02/15/2021   Lab Results  Component Value Date   TRIG 148 02/15/2021   Lab Results  Component Value Date   CHOLHDL 3.1 02/15/2021   Lab Results  Component Value Date   HGBA1C 3.9 (L) 03/24/2018   ASSESSMENT AND PLAN:   #1 health maintenance exam: Reviewed age and gender appropriate health maintenance issues (prudent diet, regular exercise, health risks of tobacco and excessive alcohol, use of seatbelts, fire alarms in home, use of sunscreen).  Also reviewed age and gender appropriate health screening as well as vaccine recommendations. Vaccines: Tdap->declined. Labs: Fasting health panel  today. Cervical ca screening: She will arrange with her GYN, Dr. Randolm Butte.  #2 infertility. Will recheck TSH since she was started on levothyroxine 25 mcg about 6 weeks ago. She will route the result to her infertility clinic so they can follow-up/adjust dosing.  An After Visit Summary was printed and given to the patient.  FOLLOW UP:  No follow-ups on file.  Signed:  Arletha Lady, MD           05/10/2024

## 2024-05-10 NOTE — Addendum Note (Signed)
 Addended by: Terris Fickle D on: 05/10/2024 08:45 AM   Modules accepted: Orders

## 2024-05-11 ENCOUNTER — Ambulatory Visit: Payer: Self-pay | Admitting: Family Medicine

## 2024-05-11 LAB — COMPREHENSIVE METABOLIC PANEL WITH GFR
ALT: 14 IU/L (ref 0–32)
AST: 16 IU/L (ref 0–40)
Albumin: 4.7 g/dL (ref 4.0–5.0)
Alkaline Phosphatase: 76 IU/L (ref 44–121)
BUN/Creatinine Ratio: 18 (ref 9–23)
BUN: 11 mg/dL (ref 6–20)
Bilirubin Total: 0.6 mg/dL (ref 0.0–1.2)
CO2: 22 mmol/L (ref 20–29)
Calcium: 9.9 mg/dL (ref 8.7–10.2)
Chloride: 104 mmol/L (ref 96–106)
Creatinine, Ser: 0.62 mg/dL (ref 0.57–1.00)
Globulin, Total: 2.6 g/dL (ref 1.5–4.5)
Glucose: 84 mg/dL (ref 70–99)
Potassium: 4.2 mmol/L (ref 3.5–5.2)
Sodium: 140 mmol/L (ref 134–144)
Total Protein: 7.3 g/dL (ref 6.0–8.5)
eGFR: 124 mL/min/{1.73_m2} (ref >59)

## 2024-05-11 LAB — CBC WITH DIFFERENTIAL/PLATELET
Basophils Absolute: 0.1 10*3/uL (ref 0.0–0.2)
Basos: 1 %
EOS (ABSOLUTE): 0.5 10*3/uL — ABNORMAL HIGH (ref 0.0–0.4)
Eos: 7 %
Hematocrit: 46.3 % (ref 34.0–46.6)
Hemoglobin: 15.4 g/dL (ref 11.1–15.9)
Immature Grans (Abs): 0 10*3/uL (ref 0.0–0.1)
Immature Granulocytes: 0 %
Lymphocytes Absolute: 1.7 10*3/uL (ref 0.7–3.1)
Lymphs: 22 %
MCH: 29.7 pg (ref 26.6–33.0)
MCHC: 33.3 g/dL (ref 31.5–35.7)
MCV: 89 fL (ref 79–97)
Monocytes Absolute: 0.8 10*3/uL (ref 0.1–0.9)
Monocytes: 10 %
Neutrophils Absolute: 4.7 10*3/uL (ref 1.4–7.0)
Neutrophils: 60 %
Platelets: 251 10*3/uL (ref 150–450)
RBC: 5.18 x10E6/uL (ref 3.77–5.28)
RDW: 12.3 % (ref 11.7–15.4)
WBC: 7.7 10*3/uL (ref 3.4–10.8)

## 2024-05-11 LAB — TSH: TSH: 1.59 u[IU]/mL (ref 0.450–4.500)

## 2024-05-11 LAB — LIPID PANEL
Chol/HDL Ratio: 3.7 ratio (ref 0.0–4.4)
Cholesterol, Total: 141 mg/dL (ref 100–199)
HDL: 38 mg/dL — ABNORMAL LOW (ref >39)
LDL Chol Calc (NIH): 84 mg/dL (ref 0–99)
Triglycerides: 101 mg/dL (ref 0–149)
VLDL Cholesterol Cal: 19 mg/dL (ref 5–40)

## 2024-10-26 ENCOUNTER — Encounter: Payer: Self-pay | Admitting: Obstetrics & Gynecology

## 2024-11-13 ENCOUNTER — Other Ambulatory Visit: Payer: Self-pay

## 2024-11-13 ENCOUNTER — Encounter (HOSPITAL_COMMUNITY): Payer: Self-pay | Admitting: Obstetrics and Gynecology

## 2024-11-13 ENCOUNTER — Inpatient Hospital Stay (HOSPITAL_COMMUNITY)
Admission: AD | Admit: 2024-11-13 | Discharge: 2024-11-13 | Disposition: A | Discharge status: Home / Self Care | Attending: Obstetrics and Gynecology | Admitting: Obstetrics and Gynecology

## 2024-11-13 DIAGNOSIS — O34211 Maternal care for low transverse scar from previous cesarean delivery: Secondary | ICD-10-CM | POA: Insufficient documentation

## 2024-11-13 DIAGNOSIS — Z3A01 Less than 8 weeks gestation of pregnancy: Secondary | ICD-10-CM | POA: Diagnosis not present

## 2024-11-13 DIAGNOSIS — O219 Vomiting of pregnancy, unspecified: Secondary | ICD-10-CM | POA: Diagnosis present

## 2024-11-13 DIAGNOSIS — O99611 Diseases of the digestive system complicating pregnancy, first trimester: Secondary | ICD-10-CM | POA: Diagnosis not present

## 2024-11-13 DIAGNOSIS — A084 Viral intestinal infection, unspecified: Secondary | ICD-10-CM | POA: Diagnosis not present

## 2024-11-13 DIAGNOSIS — O09811 Supervision of pregnancy resulting from assisted reproductive technology, first trimester: Secondary | ICD-10-CM | POA: Insufficient documentation

## 2024-11-13 DIAGNOSIS — R197 Diarrhea, unspecified: Secondary | ICD-10-CM | POA: Diagnosis present

## 2024-11-13 LAB — URINALYSIS, MICROSCOPIC (REFLEX)

## 2024-11-13 LAB — URINALYSIS, ROUTINE W REFLEX MICROSCOPIC
Bilirubin Urine: NEGATIVE
Glucose, UA: NEGATIVE mg/dL
Ketones, ur: >80 mg/dL — AB
Nitrite: NEGATIVE
Protein, ur: NEGATIVE mg/dL
Specific Gravity, Urine: 1.02 (ref 1.005–1.030)
pH: 6 (ref 5.0–8.0)

## 2024-11-13 LAB — POCT PREGNANCY, URINE: Preg Test, Ur: POSITIVE — AB

## 2024-11-13 MED ORDER — FAMOTIDINE IN NACL 20-0.9 MG/50ML-% IV SOLN
20.0000 mg | Freq: Once | INTRAVENOUS | Status: AC
  Administered 2024-11-13: 20 mg via INTRAVENOUS
  Filled 2024-11-13: qty 50

## 2024-11-13 MED ORDER — ACETAMINOPHEN 10 MG/ML IV SOLN
1000.0000 mg | Freq: Four times a day (QID) | INTRAVENOUS | Status: DC
  Administered 2024-11-13: 1000 mg via INTRAVENOUS
  Filled 2024-11-13 (×3): qty 100

## 2024-11-13 MED ORDER — ONDANSETRON 4 MG PO TBDP: 4.0000 mg | ORAL_TABLET | Freq: Three times a day (TID) | ORAL | 0 refills | Status: AC | PRN

## 2024-11-13 MED ORDER — LACTATED RINGERS IV BOLUS
1000.0000 mL | Freq: Once | INTRAVENOUS | Status: AC
  Administered 2024-11-13: 1000 mL via INTRAVENOUS

## 2024-11-13 MED ORDER — ONDANSETRON HCL 4 MG/2ML IJ SOLN
4.0000 mg | Freq: Once | INTRAMUSCULAR | Status: AC
  Administered 2024-11-13: 4 mg via INTRAVENOUS
  Filled 2024-11-13: qty 2

## 2024-11-13 MED ORDER — SODIUM CHLORIDE 0.9 % IV SOLN
25.0000 mg | Freq: Once | INTRAVENOUS | Status: AC
  Administered 2024-11-13: 25 mg via INTRAVENOUS
  Filled 2024-11-13: qty 1

## 2024-11-13 NOTE — MAU Note (Addendum)
 Heidi Shields is a 28 y.o. at Unknown here in MAU reporting: +HPT 10/26/24 Had first US  with the fertility clinic on the 11/26 and she was [redacted]w[redacted]d by US , EDD 07/01/25 Started vomiting and diarrhea at 0300. Unable to keep fluids or solids down. Denies any abdominal pain or VB. Denies any other symptoms. Feels weak.   Onset of complaint: 0300 Pain score: generalized fatigue  Vitals:   11/13/24 1557  BP: 127/78  Pulse: (!) 127  Resp: 18  Temp: 97.6 F (36.4 C)  SpO2: 98%     FHT:n/a Lab orders placed from triage:  UA

## 2024-11-13 NOTE — MAU Provider Note (Cosign Needed Addendum)
 Chief Complaint:  Emesis  HPI   Event Date/Time   First Provider Initiated Contact with Patient 11/13/24 1631     Heidi Shields is a 28 y.o. G2P1001 at [redacted]w[redacted]d who presents to maternity admissions reporting nausea, vomiting, diarrhea since last night. Ate dinner with her family and shortly after began feeling nauseated, then had vomiting with diarrhea on each occasion. Has been unable to keep anything down. Feels overall unwell and achy but thinks she has been afebrile (but sweaty). No other physical complaints, including lower abdominal pain or vaginal bleeding.  Pregnancy Course: Receives care at [facility], this is an IVF pregnancy.  Past Medical History:  Diagnosis Date   Acne vulgaris    Anxiety and depression    Chlamydia 07/21/2017   + at GYN screening age 51.  Test of cure done and was negative (Dr. Jayne).   Hay fever    History of mononucleosis syndrome 2012   Irregular menstrual cycle    PIH (pregnancy induced hypertension)    OB History  Gravida Para Term Preterm AB Living  2 1 1  0 0 1  SAB IAB Ectopic Multiple Live Births  0 0 0 0 1    # Outcome Date GA Lbr Len/2nd Weight Sex Type Anes PTL Lv  2 Current           1 Term 07/01/22 [redacted]w[redacted]d 31:30 / 01:54 4240 g M CS-LTranv EPI  LIV   Past Surgical History:  Procedure Laterality Date   CESAREAN SECTION Bilateral 07/01/2022   Procedure: CESAREAN SECTION;  Surgeon: Zina Jerilynn LABOR, MD;  Location: [facility] LD ORS;  Service: Obstetrics;  Laterality: Bilateral;   TOE SURGERY     age 14   Family History  Problem Relation Age of Onset   Diabetes Father    Thyroid  disease Sister    Cirrhosis Maternal Grandmother    High blood pressure Paternal Grandmother    High blood pressure Paternal Grandfather    Breast cancer Other        Aunt   Social History   Tobacco Use   Smoking status: Never   Smokeless tobacco: Never  Vaping Use   Vaping status: Never Used  Substance Use Topics   Alcohol use: Not Currently    Comment:  occ   Drug use: No   Allergies  Allergen Reactions   Prednisone  Anaphylaxis    Elevated BP, HR and BS   Duloxetine  Nausea Only   Medications Prior to Admission  Medication Sig Dispense Refill Last Dose/Taking   levothyroxine  (SYNTHROID ) 25 MCG tablet Take 1 tablet (25 mcg total) by mouth daily.   11/12/2024   progesterone (PROMETRIUM) 200 MG capsule Take 200 mg by mouth 2 (two) times daily.   11/13/2024 Evening   I have reviewed patient's Past Medical Hx, Surgical Hx, Family Hx, Social Hx, medications and allergies.   ROS  Pertinent items noted in HPI and remainder of comprehensive ROS otherwise negative.   PHYSICAL EXAM  Patient Vitals for the past 24 hrs:  BP Temp Temp src Pulse Resp SpO2 Height Weight  11/13/24 1956 112/65 100 F (37.8 C) Oral (!) 104 16 -- -- --  11/13/24 1622 106/63 -- -- (!) 110 -- -- -- --  11/13/24 1557 127/78 97.6 F (36.4 C) Oral (!) 127 18 98 % 5' 4 (1.626 m) 82.1 kg   Constitutional: Well-developed, well-nourished but pale and diaphoretic female in mild distress.  Cardiovascular: tachycardic but warm and well-perfused Respiratory: normal effort, no  problems with respiration noted GI: Abd soft, non-tender, non-distended MS: Extremities nontender, no edema, normal ROM Neurologic: Alert and oriented x 4.  GU: no CVA tenderness Pelvic: exam deferred  Labs: Results for orders placed or performed during the hospital encounter of 11/13/24 (from the past 24 hours)  Pregnancy, urine POC     Status: Abnormal   Collection Time: 11/13/24  3:53 PM  Result Value Ref Range   Preg Test, Ur POSITIVE (A) NEGATIVE  Urinalysis, Routine w reflex microscopic -Urine, Clean Catch     Status: Abnormal   Collection Time: 11/13/24  4:00 PM  Result Value Ref Range   Color, Urine YELLOW YELLOW   APPearance CLEAR CLEAR   Specific Gravity, Urine 1.020 1.005 - 1.030   pH 6.0 5.0 - 8.0   Glucose, UA NEGATIVE NEGATIVE mg/dL   Hgb urine dipstick TRACE (A) NEGATIVE    Bilirubin Urine NEGATIVE NEGATIVE   Ketones, ur >80 (A) NEGATIVE mg/dL   Protein, ur NEGATIVE NEGATIVE mg/dL   Nitrite NEGATIVE NEGATIVE   Leukocytes,Ua MODERATE (A) NEGATIVE  Urinalysis, Microscopic (reflex)     Status: Abnormal   Collection Time: 11/13/24  4:00 PM  Result Value Ref Range   RBC / HPF 6-10 0 - 5 RBC/hpf   WBC, UA 21-50 0 - 5 WBC/hpf   Bacteria, UA FEW (A) NONE SEEN   Squamous Epithelial / HPF 11-20 0 - 5 /HPF   Mucus PRESENT    Imaging:  No results found.  MDM & MAU COURSE  MDM:  MAU Course: Orders Placed This Encounter  Procedures   Urinalysis, Routine w reflex microscopic -Urine, Clean Catch   Urinalysis, Microscopic (reflex)   Contact Isolation: Enteric   Pregnancy, urine POC   Discharge patient   Meds ordered this encounter  Medications   lactated ringers  bolus 1,000 mL   ondansetron  (ZOFRAN ) injection 4 mg   famotidine  (PEPCID ) IVPB 20 mg premix   promethazine  (PHENERGAN ) 25 mg in sodium chloride  0.9 % 1,000 mL infusion   acetaminophen  (OFIRMEV ) IV 1,000 mg    Is the patient UNABLE to take oral / enteral medications?:   Yes   ondansetron  (ZOFRAN -ODT) 4 MG disintegrating tablet    Sig: Take 1 tablet (4 mg total) by mouth every 8 (eight) hours as needed for nausea or vomiting.    Dispense:  30 tablet    Refill:  0   GI meds given with good relief of nausea, still feeling unwell and temp found to be 100 after two bags of IV fluids. IV Tylenol  ordered and report/care given to Joesph Sear, PA-C. Cornell Finder, CNM, MSN, IBCLC Certified Nurse Midwife, [facility]  Care assumed at shift change at 2000. Patient reassessed after IV Tylenol , mild improvement and temperature stable. Safe to discharge home.  ASSESSMENT   1. Viral gastroenteritis   2. [redacted] weeks gestation of pregnancy     PLAN  Discharge from MAU in stable condition. Discussed and provided information in AVS regarding viral gastroenteritis. Prescription for Zofran  sent  for symptom management. Encouraged PO intake as tolerated, starting with bland foods and water /electrolyte drinks. Tylenol  at home as needed for body aches and/or fever.  Joesph DELENA Sear, PA

## 2024-11-24 ENCOUNTER — Telehealth: Payer: Self-pay | Admitting: Obstetrics & Gynecology

## 2024-11-24 NOTE — Telephone Encounter (Signed)
 Returned patient's call. Appt made with Dr Jayne for 2 weeks

## 2024-11-24 NOTE — Telephone Encounter (Signed)
 Pt called in request call back from nurse regarding Mychart message from doctor regarding appt.

## 2024-12-05 ENCOUNTER — Ambulatory Visit: Admitting: Obstetrics & Gynecology

## 2024-12-05 VITALS — BP 117/77 | HR 105 | Ht 63.0 in | Wt 180.0 lb

## 2024-12-05 DIAGNOSIS — Z3490 Encounter for supervision of normal pregnancy, unspecified, unspecified trimester: Secondary | ICD-10-CM

## 2024-12-22 ENCOUNTER — Encounter: Payer: Self-pay | Admitting: Women's Health

## 2024-12-22 ENCOUNTER — Ambulatory Visit: Admitting: *Deleted

## 2024-12-22 ENCOUNTER — Ambulatory Visit: Admitting: Women's Health

## 2024-12-22 ENCOUNTER — Other Ambulatory Visit (HOSPITAL_COMMUNITY)
Admission: RE | Admit: 2024-12-22 | Discharge: 2024-12-22 | Disposition: A | Source: Ambulatory Visit | Attending: Women's Health | Admitting: Women's Health

## 2024-12-22 VITALS — BP 116/80 | HR 102 | Wt 179.0 lb

## 2024-12-22 DIAGNOSIS — Z113 Encounter for screening for infections with a predominantly sexual mode of transmission: Secondary | ICD-10-CM | POA: Insufficient documentation

## 2024-12-22 DIAGNOSIS — O34219 Maternal care for unspecified type scar from previous cesarean delivery: Secondary | ICD-10-CM | POA: Diagnosis not present

## 2024-12-22 DIAGNOSIS — Z1332 Encounter for screening for maternal depression: Secondary | ICD-10-CM | POA: Diagnosis not present

## 2024-12-22 DIAGNOSIS — O0901 Supervision of pregnancy with history of infertility, first trimester: Secondary | ICD-10-CM

## 2024-12-22 DIAGNOSIS — Z3A12 12 weeks gestation of pregnancy: Secondary | ICD-10-CM

## 2024-12-22 DIAGNOSIS — Z3481 Encounter for supervision of other normal pregnancy, first trimester: Secondary | ICD-10-CM

## 2024-12-22 DIAGNOSIS — Z1329 Encounter for screening for other suspected endocrine disorder: Secondary | ICD-10-CM

## 2024-12-22 DIAGNOSIS — Z131 Encounter for screening for diabetes mellitus: Secondary | ICD-10-CM

## 2024-12-22 DIAGNOSIS — Z6832 Body mass index (BMI) 32.0-32.9, adult: Secondary | ICD-10-CM

## 2024-12-22 DIAGNOSIS — Z3490 Encounter for supervision of normal pregnancy, unspecified, unspecified trimester: Secondary | ICD-10-CM | POA: Insufficient documentation

## 2024-12-22 DIAGNOSIS — Z8759 Personal history of other complications of pregnancy, childbirth and the puerperium: Secondary | ICD-10-CM | POA: Diagnosis not present

## 2024-12-22 DIAGNOSIS — Z3480 Encounter for supervision of other normal pregnancy, unspecified trimester: Secondary | ICD-10-CM | POA: Insufficient documentation

## 2024-12-22 MED ORDER — PROGESTERONE 200 MG PO CAPS: 200.0000 mg | ORAL_CAPSULE | Freq: Two times a day (BID) | ORAL | 0 refills | Status: AC

## 2024-12-22 MED ORDER — ASPIRIN 81 MG PO TBEC: 81.0000 mg | DELAYED_RELEASE_TABLET | Freq: Every day | ORAL | 3 refills | Status: AC

## 2024-12-22 MED ORDER — DOXYLAMINE-PYRIDOXINE 10-10 MG PO TBEC: DELAYED_RELEASE_TABLET | ORAL | 6 refills | Status: AC

## 2024-12-22 NOTE — Progress Notes (Signed)
 "   INITIAL OBSTETRICAL VISIT Patient name: Heidi Shields MRN [id number]  Date of birth: 12-Nov-1996 Chief Complaint:   Initial Prenatal Visit  History of Present Illness:   Heidi Shields is a 29 y.o. G110P1001 Caucasian female at [redacted]w[redacted]d by LMP c/w u/s at 6 weeks with an Estimated Date of Delivery: 07/03/25 being seen today for her initial obstetrical visit.   Patient's last menstrual period was 09/26/2024. Her obstetrical history is significant for term C/S @ 37w for FTD after 2-3hr pushing, baby 9lb5.6oz, no GDM.   Infertility, had done egg retrieval w/ [facility], was having endometrial biopsies and had inflammation, was given antibiotics and conceived naturally on her own right before embryo transfer! [facility] w/ this pregnancy, was told to hold ASA for now Taking prometrium  until 14wks Taking synthroid  25mcg daily per [facility] to keep TSH <2 Today she reports nausea, decreased appetite, zofran  made it worse, phenergan  knocks her out.  Last pap 2021. Results were: neg, wants next visit     12/22/2024    3:54 PM 05/10/2024    8:25 AM 04/28/2022    9:11 AM 01/02/2022    3:05 PM 02/15/2021    8:34 AM  Depression screen PHQ 2/9  Decreased Interest 0 0 0 0 0  Down, Depressed, Hopeless 0 0 0 0 0  PHQ - 2 Score 0 0 0 0 0  Altered sleeping 0  0 0   Tired, decreased energy 0  0 0   Change in appetite 0  0 1   Feeling bad or failure about yourself  0  0 0   Trouble concentrating 0  0 0   Moving slowly or fidgety/restless 0  0 0   Suicidal thoughts 0  0 0   PHQ-9 Score 0  0  1       Data saved with a previous flowsheet row definition        12/22/2024    3:55 PM 04/28/2022    9:12 AM 01/02/2022    3:05 PM 07/24/2020    1:43 PM  GAD 7 : Generalized Anxiety Score  Nervous, Anxious, on Edge 0 0 0 0  Control/stop worrying 0 0 0 0  Worry too much - different things 0 0 0 0  Trouble relaxing 0 0 0 0  Restless 0 0 0 0  Easily annoyed or irritable 0 0 0 0  Afraid - awful might happen 0 0 0 0  Total GAD  7 Score 0 0 0 0  Anxiety Difficulty    Not difficult at all     Review of Systems:   Pertinent items are noted in HPI Denies cramping/contractions, leakage of fluid, vaginal bleeding, abnormal vaginal discharge w/ itching/odor/irritation, headaches, visual changes, shortness of breath, chest pain, abdominal pain, severe nausea/vomiting, or problems with urination or bowel movements unless otherwise stated above.  Pertinent History Reviewed:  Reviewed past medical,surgical, social, obstetrical and family history.  Reviewed problem list, medications and allergies. OB History  Gravida Para Term Preterm AB Living  2 1 1  0 0 1  SAB IAB Ectopic Multiple Live Births  0 0 0 0 1    # Outcome Date GA Lbr Len/2nd Weight Sex Type Anes PTL Lv  2 Current           1 Term 07/01/22 [redacted]w[redacted]d 31:30 / 01:54 9 lb 5.6 oz (4.24 kg) M CS-LTranv EPI  LIV     Complications: Gestational hypertension   Physical Assessment:   Vitals:  12/22/24 1546  BP: 116/80  Pulse: (!) 102  Weight: 179 lb (81.2 kg)  Body mass index is 31.71 kg/m.       Physical Examination:  General appearance - well appearing, and in no distress  Mental status - alert, oriented to person, place, and time  Psych:  She has a normal mood and affect  Skin - warm and dry, normal color, no suspicious lesions noted  Chest - effort normal, all lung fields clear to auscultation bilaterally  Heart - normal rate and regular rhythm  Abdomen - soft, nontender  Extremities:  No swelling or varicosities noted  Thin prep pap is not done>wants next visit  Chaperone: N/A  TODAY'S informal TA u/s: +FCA and active fetus  No results found for this or any previous visit (from the past 24 hours).  Assessment & Plan:  1) Low-Risk Pregnancy G2P1001 at [redacted]w[redacted]d with an Estimated Date of Delivery: 07/03/25   2) Initial OB visit  3) Prev c/s> for FTD, baby 9lb5oz at 37wk, no GDM, TOLAC consent given to review, plan EFW ~36w  4) H/O GHTN> was told to  hold ASA for now d/t [facility], baseline labs today. Has f/u u/s on Monday to recheck [facility]  5) Past due for pap> wants next visit  6) Infertility> seen by [facility], had done egg retrieval, endometrial biopsies, had inflammation was given antbx and conceived naturally on her own! On promterium til 14w, synthroid  b/c TSH was >2 (will recheck TSH today)  Meds:  Meds ordered this encounter  Medications   aspirin  EC 81 MG tablet    Sig: Take 1 tablet (81 mg total) by mouth daily. Swallow whole.    Dispense:  90 tablet    Refill:  3   Doxylamine -Pyridoxine  (DICLEGIS ) 10-10 MG TBEC    Sig: 2 tabs q hs, if sx persist add 1 tab q am on day 3, if sx persist add 1 tab q afternoon on day 4    Dispense:  100 tablet    Refill:  6   progesterone  (PROMETRIUM ) 200 MG capsule    Sig: Take 1 capsule (200 mg total) by mouth 2 (two) times daily.    Dispense:  24 capsule    Refill:  0    Initial labs obtained Continue prenatal vitamins Reviewed n/v relief measures and warning s/s to report Reviewed recommended weight gain based on pre-gravid BMI Encouraged well-balanced diet Genetic & carrier screening discussed:  had neg MaterniT21 w/ [facility], wants AFP next visit Ultrasound discussed; fetal survey: requested CCNC completed> form faxed if has or is planning to apply for medicaid The nature of [facility] [employer] with multiple MDs and other Advanced Practice Providers was explained to patient; also emphasized that fellows, residents, and students are part of our team. Does have home bp cuff. Office bp cuff given: no. Rx sent: no. Check bp weekly, let us  know if consistently >140/90.   Follow-up: Return in about 4 weeks (around 01/19/2025) for LROB, CNM, in person; then @ 20w for anatomy u/s and LROB .   Orders Placed This Encounter  Procedures   Urine Culture   CBC/D/Plt+RPR+Rh+ABO+RubIgG...   Hemoglobin A1c   Comprehensive metabolic panel with GFR   Protein / creatinine ratio, urine    TSH    Suzen JONELLE Fetters CNM, [facility] 12/22/2024 4:39 PM  "

## 2024-12-22 NOTE — Patient Instructions (Signed)
Heidi Shields, thank you for choosing our office today! We appreciate the opportunity to meet your healthcare needs. You may receive a short survey by mail, e-mail, or through MyChart. If you are happy with your care we would appreciate if you could take just a few minutes to complete the survey questions. We read all of your comments and take your feedback very seriously. Thank you again for choosing our office.  [facility] ([address] [place], [place] [address]) Entrance C, located off of [address] Free 24/7 valet parking   Nausea & Vomiting Have saltine crackers or pretzels by your bed and eat a few bites before you raise your head out of bed in the morning Eat small frequent meals throughout the day instead of large meals Drink plenty of fluids throughout the day to stay hydrated, just don't drink a lot of fluids with your meals.  This can make your stomach fill up faster making you feel sick Do not brush your teeth right after you eat Products with real ginger are good for nausea, like ginger ale and ginger hard candy Make sure it says made with real ginger! Sucking on sour candy like lemon heads is also good for nausea If your prenatal vitamins make you nauseated, take them at night so you will sleep through the nausea Sea Bands If you feel like you need medicine for the nausea & vomiting please let us know If you are unable to keep any fluids or food down please let us know   Constipation Drink plenty of fluid, preferably water, throughout the day Eat foods high in fiber such as fruits, vegetables, and grains Exercise, such as walking, is a good way to keep your bowels regular Drink warm fluids, especially warm prune juice, or decaf coffee Eat a 1/2 cup of real oatmeal (not instant), 1/2 cup applesauce, and 1/2-1 cup warm prune juice every day If needed, you may take Colace (docusate sodium) stool softener  once or twice a day to help keep the stool soft.  If you still are having problems with constipation, you may take Miralax once daily as needed to help keep your bowels regular.   Home Blood Pressure Monitoring for Patients   Your provider has recommended that you check your blood pressure (BP) at least once a week at home. If you do not have a blood pressure cuff at home, one will be provided for you. Contact your provider if you have not received your monitor within 1 week.   Helpful Tips for Accurate Home Blood Pressure Checks  Don't smoke, exercise, or drink caffeine 30 minutes before checking your BP Use the restroom before checking your BP (a full bladder can raise your pressure) Relax in a comfortable upright chair Feet on the ground Left arm resting comfortably on a flat surface at the level of your heart Legs uncrossed Back supported Sit quietly and don't talk Place the cuff on your bare arm Adjust snuggly, so that only two fingertips can fit between your skin and the top of the cuff Check 2 readings separated by at least one minute Keep a log of your BP readings For a visual, please reference this diagram: http://ccnc.care/bpdiagram  Provider Name: [facility]     Phone: [phone]  Zone 1: ALL CLEAR  Continue to monitor your symptoms:  BP reading is less than 140 (top number) or less than 90 (bottom   number)  No right upper stomach pain No headaches or seeing spots No feeling nauseated or throwing up No swelling in face and hands  Zone 2: CAUTION Call your doctor's office for any of the following:  BP reading is greater than 140 (top number) or greater than 90 (bottom number)  Stomach pain under your ribs in the middle or right side Headaches or seeing spots Feeling nauseated or throwing up Swelling in face and hands  Zone 3: EMERGENCY  Seek immediate medical care if you have any of the following:  BP reading is greater than160 (top number) or greater than  110 (bottom number) Severe headaches not improving with Tylenol Serious difficulty catching your breath Any worsening symptoms from Zone 2    First Trimester of Pregnancy The first trimester of pregnancy is from week 1 until the end of week 12 (months 1 through 3). A week after a sperm fertilizes an egg, the egg will implant on the wall of the uterus. This embryo will begin to develop into a baby. Genes from you and your partner are forming the baby. The female genes determine whether the baby is a boy or a girl. At 6-8 weeks, the eyes and face are formed, and the heartbeat can be seen on ultrasound. At the end of 12 weeks, all the baby's organs are formed.  Now that you are pregnant, you will want to do everything you can to have a healthy baby. Two of the most important things are to get good prenatal care and to follow your health care provider's instructions. Prenatal care is all the medical care you receive before the baby's birth. This care will help prevent, find, and treat any problems during the pregnancy and childbirth. BODY CHANGES Your body goes through many changes during pregnancy. The changes vary from woman to woman.  You may gain or lose a couple of pounds at first. You may feel sick to your stomach (nauseous) and throw up (vomit). If the vomiting is uncontrollable, call your health care provider. You may tire easily. You may develop headaches that can be relieved by medicines approved by your health care provider. You may urinate more often. Painful urination may mean you have a bladder infection. You may develop heartburn as a result of your pregnancy. You may develop constipation because certain hormones are causing the muscles that push waste through your intestines to slow down. You may develop hemorrhoids or swollen, bulging veins (varicose veins). Your breasts may begin to grow larger and become tender. Your nipples may stick out more, and the tissue that surrounds them  (areola) may become darker. Your gums may bleed and may be sensitive to brushing and flossing. Dark spots or blotches (chloasma, mask of pregnancy) may develop on your face. This will likely fade after the baby is born. Your menstrual periods will stop. You may have a loss of appetite. You may develop cravings for certain kinds of food. You may have changes in your emotions from day to day, such as being excited to be pregnant or being concerned that something may go wrong with the pregnancy and baby. You may have more vivid and strange dreams. You may have changes in your hair. These can include thickening of your hair, rapid growth, and changes in texture. Some women also have hair loss during or after pregnancy, or hair that feels dry or thin. Your hair will most likely return to normal after your baby is born. WHAT TO EXPECT AT YOUR PRENATAL   VISITS During a routine prenatal visit: You will be weighed to make sure you and the baby are growing normally. Your blood pressure will be taken. Your abdomen will be measured to track your baby's growth. The fetal heartbeat will be listened to starting around week 10 or 12 of your pregnancy. Test results from any previous visits will be discussed. Your health care provider may ask you: How you are feeling. If you are feeling the baby move. If you have had any abnormal symptoms, such as leaking fluid, bleeding, severe headaches, or abdominal cramping. If you have any questions. Other tests that may be performed during your first trimester include: Blood tests to find your blood type and to check for the presence of any previous infections. They will also be used to check for low iron levels (anemia) and Rh antibodies. Later in the pregnancy, blood tests for diabetes will be done along with other tests if problems develop. Urine tests to check for infections, diabetes, or protein in the urine. An ultrasound to confirm the proper growth and development  of the baby. An amniocentesis to check for possible genetic problems. Fetal screens for spina bifida and Down syndrome. You may need other tests to make sure you and the baby are doing well. HOME CARE INSTRUCTIONS  Medicines Follow your health care provider's instructions regarding medicine use. Specific medicines may be either safe or unsafe to take during pregnancy. Take your prenatal vitamins as directed. If you develop constipation, try taking a stool softener if your health care provider approves. Diet Eat regular, well-balanced meals. Choose a variety of foods, such as meat or vegetable-based protein, fish, milk and low-fat dairy products, vegetables, fruits, and whole grain breads and cereals. Your health care provider will help you determine the amount of weight gain that is right for you. Avoid raw meat and uncooked cheese. These carry germs that can cause birth defects in the baby. Eating four or five small meals rather than three large meals a day may help relieve nausea and vomiting. If you start to feel nauseous, eating a few soda crackers can be helpful. Drinking liquids between meals instead of during meals also seems to help nausea and vomiting. If you develop constipation, eat more high-fiber foods, such as fresh vegetables or fruit and whole grains. Drink enough fluids to keep your urine clear or pale yellow. Activity and Exercise Exercise only as directed by your health care provider. Exercising will help you: Control your weight. Stay in shape. Be prepared for labor and delivery. Experiencing pain or cramping in the lower abdomen or low back is a good sign that you should stop exercising. Check with your health care provider before continuing normal exercises. Try to avoid standing for long periods of time. Move your legs often if you must stand in one place for a long time. Avoid heavy lifting. Wear low-heeled shoes, and practice good posture. You may continue to have sex  unless your health care provider directs you otherwise. Relief of Pain or Discomfort Wear a good support bra for breast tenderness.   Take warm sitz baths to soothe any pain or discomfort caused by hemorrhoids. Use hemorrhoid cream if your health care provider approves.   Rest with your legs elevated if you have leg cramps or low back pain. If you develop varicose veins in your legs, wear support hose. Elevate your feet for 15 minutes, 3-4 times a day. Limit salt in your diet. Prenatal Care Schedule your prenatal visits by the   twelfth week of pregnancy. They are usually scheduled monthly at first, then more often in the last 2 months before delivery. Write down your questions. Take them to your prenatal visits. Keep all your prenatal visits as directed by your health care provider. Safety Wear your seat belt at all times when driving. Make a list of emergency phone numbers, including numbers for family, friends, the hospital, and police and fire departments. General Tips Ask your health care provider for a referral to a local prenatal education class. Begin classes no later than at the beginning of month 6 of your pregnancy. Ask for help if you have counseling or nutritional needs during pregnancy. Your health care provider can offer advice or refer you to specialists for help with various needs. Do not use hot tubs, steam rooms, or saunas. Do not douche or use tampons or scented sanitary pads. Do not cross your legs for long periods of time. Avoid cat litter boxes and soil used by cats. These carry germs that can cause birth defects in the baby and possibly loss of the fetus by miscarriage or stillbirth. Avoid all smoking, herbs, alcohol, and medicines not prescribed by your health care provider. Chemicals in these affect the formation and growth of the baby. Schedule a dentist appointment. At home, brush your teeth with a soft toothbrush and be gentle when you floss. SEEK MEDICAL CARE IF:   You have dizziness. You have mild pelvic cramps, pelvic pressure, or nagging pain in the abdominal area. You have persistent nausea, vomiting, or diarrhea. You have a bad smelling vaginal discharge. You have pain with urination. You notice increased swelling in your face, hands, legs, or ankles. SEEK IMMEDIATE MEDICAL CARE IF:  You have a fever. You are leaking fluid from your vagina. You have spotting or bleeding from your vagina. You have severe abdominal cramping or pain. You have rapid weight gain or loss. You vomit blood or material that looks like coffee grounds. You are exposed to German measles and have never had them. You are exposed to fifth disease or chickenpox. You develop a severe headache. You have shortness of breath. You have any kind of trauma, such as from a fall or a car accident. Document Released: 11/25/2001 Document Revised: 04/17/2014 Document Reviewed: 10/11/2013 Exit[employer] Patient Information 2015 [employer]. This information is not intended to replace advice given to you by your health care provider. Make sure you discuss any questions you have with your health care provider.  

## 2024-12-23 ENCOUNTER — Encounter: Payer: Self-pay | Admitting: Women's Health

## 2024-12-23 ENCOUNTER — Other Ambulatory Visit: Payer: Self-pay | Admitting: Obstetrics & Gynecology

## 2024-12-23 DIAGNOSIS — O208 Other hemorrhage in early pregnancy: Secondary | ICD-10-CM

## 2024-12-23 LAB — COMPREHENSIVE METABOLIC PANEL WITH GFR
ALT: 7 IU/L (ref 0–32)
AST: 10 IU/L (ref 0–40)
Albumin: 4.4 g/dL (ref 4.0–5.0)
Alkaline Phosphatase: 56 IU/L (ref 41–116)
BUN/Creatinine Ratio: 13 (ref 9–23)
BUN: 6 mg/dL (ref 6–20)
Bilirubin Total: 0.4 mg/dL (ref 0.0–1.2)
CO2: 22 mmol/L (ref 20–29)
Calcium: 9.7 mg/dL (ref 8.7–10.2)
Chloride: 103 mmol/L (ref 96–106)
Creatinine, Ser: 0.48 mg/dL — ABNORMAL LOW (ref 0.57–1.00)
Globulin, Total: 2.8 g/dL (ref 1.5–4.5)
Glucose: 87 mg/dL (ref 70–99)
Potassium: 4.1 mmol/L (ref 3.5–5.2)
Sodium: 139 mmol/L (ref 134–144)
Total Protein: 7.2 g/dL (ref 6.0–8.5)
eGFR: 131 mL/min/1.73

## 2024-12-23 LAB — CBC/D/PLT+RPR+RH+ABO+RUBIGG...
Antibody Screen: NEGATIVE
Basophils Absolute: 0 x10E3/uL (ref 0.0–0.2)
Basos: 0 %
EOS (ABSOLUTE): 0.3 x10E3/uL (ref 0.0–0.4)
Eos: 3 %
HCV Ab: NONREACTIVE
HIV Screen 4th Generation wRfx: NONREACTIVE
Hematocrit: 41.9 % (ref 34.0–46.6)
Hemoglobin: 14.2 g/dL (ref 11.1–15.9)
Hepatitis B Surface Ag: NEGATIVE
Immature Grans (Abs): 0.1 x10E3/uL (ref 0.0–0.1)
Immature Granulocytes: 1 %
Lymphocytes Absolute: 1.5 x10E3/uL (ref 0.7–3.1)
Lymphs: 15 %
MCH: 30.6 pg (ref 26.6–33.0)
MCHC: 33.9 g/dL (ref 31.5–35.7)
MCV: 90 fL (ref 79–97)
Monocytes Absolute: 0.8 x10E3/uL (ref 0.1–0.9)
Monocytes: 8 %
Neutrophils Absolute: 7.3 x10E3/uL — ABNORMAL HIGH (ref 1.4–7.0)
Neutrophils: 73 %
Platelets: 236 x10E3/uL (ref 150–450)
RBC: 4.64 x10E6/uL (ref 3.77–5.28)
RDW: 12 % (ref 11.7–15.4)
RPR Ser Ql: NONREACTIVE
Rh Factor: POSITIVE
Rubella Antibodies, IGG: 1.73 {index}
WBC: 10 x10E3/uL (ref 3.4–10.8)

## 2024-12-23 LAB — HEMOGLOBIN A1C
Est. average glucose Bld gHb Est-mCnc: 77 mg/dL
Hgb A1c MFr Bld: 4.3 % — ABNORMAL LOW (ref 4.8–5.6)

## 2024-12-23 LAB — PROTEIN / CREATININE RATIO, URINE
Creatinine, Urine: 113.1 mg/dL
Protein, Ur: 8.1 mg/dL
Protein/Creat Ratio: 72 mg/g{creat} (ref 0–200)

## 2024-12-23 LAB — TSH: TSH: 0.496 u[IU]/mL (ref 0.450–4.500)

## 2024-12-23 LAB — HCV INTERPRETATION

## 2024-12-25 NOTE — Progress Notes (Signed)
 Pt here to touch base regarding early pregnancy, thought we were going to do sonogram but had at her [facility] office  Doing well no bleeding Follow up for initial OB and sonogram at 12 weeks  All questions answered Recommend continuing lovenox  for now and prometrium  til 14 weeks

## 2024-12-26 ENCOUNTER — Encounter: Payer: Self-pay | Admitting: Women's Health

## 2024-12-26 ENCOUNTER — Other Ambulatory Visit: Payer: Self-pay | Admitting: Obstetrics & Gynecology

## 2024-12-26 ENCOUNTER — Other Ambulatory Visit

## 2024-12-26 DIAGNOSIS — N979 Female infertility, unspecified: Secondary | ICD-10-CM | POA: Insufficient documentation

## 2024-12-26 DIAGNOSIS — O208 Other hemorrhage in early pregnancy: Secondary | ICD-10-CM

## 2024-12-26 DIAGNOSIS — Z3480 Encounter for supervision of other normal pregnancy, unspecified trimester: Secondary | ICD-10-CM

## 2024-12-26 LAB — CERVICOVAGINAL ANCILLARY ONLY
Chlamydia: NEGATIVE
Comment: NEGATIVE
Comment: NORMAL
Neisseria Gonorrhea: NEGATIVE

## 2024-12-26 NOTE — Progress Notes (Signed)
 US  13 wsk,measurements c/w dates,CRL 74.79 mm,FHR 160 bpm,anterior placenta,normal ovaries,borderline thickened NT 3.4 mm,NB present

## 2024-12-27 LAB — URINE CULTURE

## 2024-12-28 ENCOUNTER — Encounter: Payer: Self-pay | Admitting: Obstetrics & Gynecology

## 2024-12-28 ENCOUNTER — Ambulatory Visit: Payer: Self-pay | Admitting: Women's Health

## 2024-12-28 DIAGNOSIS — O99891 Other specified diseases and conditions complicating pregnancy: Secondary | ICD-10-CM | POA: Insufficient documentation

## 2024-12-28 MED ORDER — NITROFURANTOIN MONOHYD MACRO 100 MG PO CAPS: 100.0000 mg | ORAL_CAPSULE | Freq: Two times a day (BID) | ORAL | 0 refills | Status: AC

## 2025-01-18 ENCOUNTER — Ambulatory Visit: Admitting: Women's Health

## 2025-01-18 ENCOUNTER — Encounter: Payer: Self-pay | Admitting: Women's Health

## 2025-01-18 VITALS — BP 124/81 | HR 90 | Wt 183.0 lb

## 2025-01-18 DIAGNOSIS — Z124 Encounter for screening for malignant neoplasm of cervix: Secondary | ICD-10-CM

## 2025-01-18 DIAGNOSIS — O2342 Unspecified infection of urinary tract in pregnancy, second trimester: Secondary | ICD-10-CM

## 2025-01-18 DIAGNOSIS — Z3A16 16 weeks gestation of pregnancy: Secondary | ICD-10-CM | POA: Diagnosis not present

## 2025-01-18 DIAGNOSIS — O34219 Maternal care for unspecified type scar from previous cesarean delivery: Secondary | ICD-10-CM | POA: Diagnosis not present

## 2025-01-18 DIAGNOSIS — Z3482 Encounter for supervision of other normal pregnancy, second trimester: Secondary | ICD-10-CM

## 2025-01-18 DIAGNOSIS — Z5189 Encounter for other specified aftercare: Secondary | ICD-10-CM

## 2025-01-18 DIAGNOSIS — Z1379 Encounter for other screening for genetic and chromosomal anomalies: Secondary | ICD-10-CM | POA: Diagnosis not present

## 2025-01-18 DIAGNOSIS — Z363 Encounter for antenatal screening for malformations: Secondary | ICD-10-CM

## 2025-01-18 DIAGNOSIS — R8271 Bacteriuria: Secondary | ICD-10-CM

## 2025-01-18 DIAGNOSIS — Z3480 Encounter for supervision of other normal pregnancy, unspecified trimester: Secondary | ICD-10-CM

## 2025-01-18 MED ORDER — LEVOTHYROXINE SODIUM 25 MCG PO TABS: 25.0000 ug | ORAL_TABLET | Freq: Every day | ORAL | 6 refills | Status: AC

## 2025-01-18 NOTE — Progress Notes (Signed)
 "   LOW-RISK PREGNANCY VISIT Patient name: Heidi Shields MRN [id number]  Date of birth: November 17, 1996 Chief Complaint:   Routine Prenatal Visit (AFP , pap, urine culture)  History of Present Illness:   Heidi Shields is a 29 y.o. G44P1001 female at [redacted]w[redacted]d with an Estimated Date of Delivery: 07/03/25 being seen today for ongoing management of a low-risk pregnancy.   Today she reports no complaints. Contractions: Not present.  .  Movement: Absent. denies leaking of fluid.     12/22/2024    3:54 PM 05/10/2024    8:25 AM 04/28/2022    9:11 AM 01/02/2022    3:05 PM 02/15/2021    8:34 AM  Depression screen PHQ 2/9  Decreased Interest 0 0 0 0 0  Down, Depressed, Hopeless 0 0 0 0 0  PHQ - 2 Score 0 0 0 0 0  Altered sleeping 0  0 0   Tired, decreased energy 0  0 0   Change in appetite 0  0 1   Feeling bad or failure about yourself  0  0 0   Trouble concentrating 0  0 0   Moving slowly or fidgety/restless 0  0 0   Suicidal thoughts 0  0 0   PHQ-9 Score 0  0  1       Data saved with a previous flowsheet row definition        12/22/2024    3:55 PM 04/28/2022    9:12 AM 01/02/2022    3:05 PM 07/24/2020    1:43 PM  GAD 7 : Generalized Anxiety Score  Nervous, Anxious, on Edge 0  0  0  0   Control/stop worrying 0  0  0  0   Worry too much - different things 0  0  0  0   Trouble relaxing 0  0  0  0   Restless 0  0  0  0   Easily annoyed or irritable 0  0  0  0   Afraid - awful might happen 0  0  0  0   Total GAD 7 Score 0 0 0 0  Anxiety Difficulty    Not difficult at all     Data saved with a previous flowsheet row definition      Review of Systems:   Pertinent items are noted in HPI Denies abnormal vaginal discharge w/ itching/odor/irritation, headaches, visual changes, shortness of breath, chest pain, abdominal pain, severe nausea/vomiting, or problems with urination or bowel movements unless otherwise stated above. Pertinent History Reviewed:  Reviewed past medical,surgical, social,  obstetrical and family history.  Reviewed problem list, medications and allergies. Physical Assessment:   Vitals:   01/18/25 1337  BP: 124/81  Pulse: 90  Weight: 183 lb (83 kg)  Body mass index is 32.42 kg/m.        Physical Examination:   General appearance: Well appearing, and in no distress  Mental status: Alert, oriented to person, place, and time  Skin: Warm & dry  Cardiovascular: Normal heart rate noted  Respiratory: Normal respiratory effort, no distress  Abdomen: Soft, gravid, nontender  Pelvic: Cervical exam deferred         Extremities: Edema: None  Fetal Status: Fetal Heart Rate (bpm): 153   Movement: Absent    Chaperone: N/A No results found for this or any previous visit (from the past 24 hours).  Assessment & Plan:  1) Low-risk pregnancy G2P1001 at [redacted]w[redacted]d with an Estimated Date  of Delivery: 07/03/25   2) Prev c/s  3) H/O GHTN> ASA  4) Recent ASB> s/p tx, urine cx poc today  5) Cervical cancer screen> pap today   Meds:  Meds ordered this encounter  Medications   levothyroxine  (SYNTHROID ) 25 MCG tablet    Sig: Take 1 tablet (25 mcg total) by mouth daily.    Dispense:  30 tablet    Refill:  6   Labs/procedures today: pap, urine culture, and AFP  Plan:  Continue routine obstetrical care  Next visit: prefers will be in person for u/s    Reviewed: Preterm labor symptoms and general obstetric precautions including but not limited to vaginal bleeding, contractions, leaking of fluid and fetal movement were reviewed in detail with the patient.  All questions were answered. Does have home bp cuff. Office bp cuff given: not applicable. Check bp weekly, let us  know if consistently >140 and/or >90.  Follow-up: Return for As scheduled.  Future Appointments  Date Time Provider Department Center  02/15/2025  9:15 AM [facility] None  02/15/2025 10:10 AM Kizzie Suzen SAUNDERS, CNM [facility]    Orders Placed This Encounter  Procedures   Urine Culture    US  OB Comp + 14 Wk   AFP, Serum, Open Spina Bifida   Suzen SAUNDERS Kizzie CNM, [facility] 01/18/2025 2:12 PM  "

## 2025-01-18 NOTE — Patient Instructions (Signed)
Heidi Shields, thank you for choosing our office today! We appreciate the opportunity to meet your healthcare needs. You may receive a short survey by mail, e-mail, or through [employer]. If you are happy with your care we would appreciate if you could take just a few minutes to complete the survey questions. We read all of your comments and take your feedback very seriously. Thank you again for choosing our office.  Center for [employer] Team at [facility] [address] [facility] ([address] [place], [place] [address]) Entrance C, located off of [address] Free 24/7 valet parking  Go to [email or web address] to register for FREE online childbirth classes  Call the office [phone]) or go to [facility] if: You begin to severe cramping Your water breaks.  Sometimes it is a big gush of fluid, sometimes it is just a trickle that keeps getting your panties wet or running down your legs You have vaginal bleeding.  It is normal to have a small amount of spotting if your cervix was checked.   [facility] Pediatricians/Family Doctors [facility]): [address] Colette Ribas, [phone]           [facility]: [address], [phone]                [facility]): [address], [phone] (call to ask if accepting patients) [facility] Department: [address], [phone]    [facility] Pediatricians/Family Doctors [facility]): [phone] [address], [phone] [facility]: [address] [place], [phone] [facility]: [address], [phone]  [facility] Doctors  [facility]): [phone] [facility]: [address], [phone]   [facility] Doctors [facility]: [address], [phone]  [facility] Doctors  [facility]: [phone], [phone]  Home Blood Pressure Monitoring for Patients   Your provider has recommended that you check your blood pressure (BP) at least once a week at home. If you do not have a blood pressure cuff at home, one will be provided for you. Contact your provider if you have not received your monitor within 1 week.   Helpful Tips for Accurate Home Blood Pressure Checks  Don't smoke, exercise, or drink caffeine 30 minutes before checking your BP Use the restroom before checking your BP (a full bladder can raise your pressure) Relax in a comfortable upright chair Feet on the ground Left arm resting comfortably on a flat surface at the level of your heart Legs uncrossed Back supported Sit quietly and don't talk Place the cuff on your bare arm Adjust snuggly, so that only two fingertips can fit between your skin and the top of the cuff Check 2 readings separated by at least one minute Keep a log of your BP readings For a visual, please reference this diagram: http://ccnc.care/bpdiagram  Provider Name: [facility]     Phone: [phone]  Zone 1: ALL CLEAR  Continue to monitor your symptoms:  BP reading is less than 140 (top number) or less than 90 (bottom number)  No right upper stomach pain No headaches or seeing spots No feeling nauseated or throwing up No swelling in face and hands  Zone 2: CAUTION Call your doctor's office for any of the following:  BP reading is greater than 140 (top number) or greater than  90 (bottom number)  Stomach pain under your ribs in the middle or right side Headaches or seeing spots Feeling nauseated or throwing up Swelling in face and hands  Zone 3: EMERGENCY  Seek immediate medical care if you have any of the following:  BP reading is greater than160 (top number) or greater than 110 (bottom number) Severe headaches not improving with Tylenol Serious difficulty catching your breath Any worsening symptoms from  Zone 2     Second Trimester of Pregnancy The second trimester is from week 14 through week 27 (months 4 through 6). The second trimester is often a time when you feel your best. Your body has adjusted to being pregnant, and you begin to feel better physically. Usually, morning sickness has lessened or quit completely, you may have more energy, and you may have an increase in appetite. The second trimester is also a time when the fetus is growing rapidly. At the end of the sixth month, the fetus is about 9 inches long and weighs about 1 pounds. You will likely begin to feel the baby move (quickening) between 16 and 20 weeks of pregnancy. Body changes during your second trimester Your body continues to go through many changes during your second trimester. The changes vary from woman to woman. Your weight will continue to increase. You will notice your lower abdomen bulging out. You may begin to get stretch marks on your hips, abdomen, and breasts. You may develop headaches that can be relieved by medicines. The medicines should be approved by your health care provider. You may urinate more often because the fetus is pressing on your bladder. You may develop or continue to have heartburn as a result of your pregnancy. You may develop constipation because certain hormones are causing the muscles that push waste through your intestines to slow down. You may develop hemorrhoids or swollen, bulging veins (varicose veins). You may have back pain. This is caused by: Weight gain. Pregnancy hormones that are relaxing the joints in your pelvis. A shift in weight and the muscles that support your balance. Your breasts will continue to grow and they will continue to become tender. Your gums may bleed and may be sensitive to brushing and flossing. Dark spots or blotches (chloasma, mask of pregnancy) may develop on your face. This will likely fade after the baby is born. A dark line from your belly button to  the pubic area (linea nigra) may appear. This will likely fade after the baby is born. You may have changes in your hair. These can include thickening of your hair, rapid growth, and changes in texture. Some women also have hair loss during or after pregnancy, or hair that feels dry or thin. Your hair will most likely return to normal after your baby is born.  What to expect at prenatal visits During a routine prenatal visit: You will be weighed to make sure you and the fetus are growing normally. Your blood pressure will be taken. Your abdomen will be measured to track your baby's growth. The fetal heartbeat will be listened to. Any test results from the previous visit will be discussed.  Your health care provider may ask you: How you are feeling. If you are feeling the baby move. If you have had any abnormal symptoms, such as leaking fluid, bleeding, severe headaches, or abdominal cramping. If you are using any tobacco products, including cigarettes, chewing tobacco, and electronic cigarettes. If you have any questions.  Other tests that may be performed during  your second trimester include: Blood tests that check for: Low iron levels (anemia). High blood sugar that affects pregnant women (gestational diabetes) between 59 and 28 weeks. Rh antibodies. This is to check for a protein on red blood cells (Rh factor). Urine tests to check for infections, diabetes, or protein in the urine. An ultrasound to confirm the proper growth and development of the baby. An amniocentesis to check for possible genetic problems. Fetal screens for spina bifida and Down syndrome. HIV (human immunodeficiency virus) testing. Routine prenatal testing includes screening for HIV, unless you choose not to have this test.  Follow these instructions at home: Medicines Follow your health care provider's instructions regarding medicine use. Specific medicines may be either safe or unsafe to take during  pregnancy. Take a prenatal vitamin that contains at least 600 micrograms (mcg) of folic acid. If you develop constipation, try taking a stool softener if your health care provider approves. Eating and drinking Eat a balanced diet that includes fresh fruits and vegetables, whole grains, good sources of protein such as meat, eggs, or tofu, and low-fat dairy. Your health care provider will help you determine the amount of weight gain that is right for you. Avoid raw meat and uncooked cheese. These carry germs that can cause birth defects in the baby. If you have low calcium intake from food, talk to your health care provider about whether you should take a daily calcium supplement. Limit foods that are high in fat and processed sugars, such as fried and sweet foods. To prevent constipation: Drink enough fluid to keep your urine clear or pale yellow. Eat foods that are high in fiber, such as fresh fruits and vegetables, whole grains, and beans. Activity Exercise only as directed by your health care provider. Most women can continue their usual exercise routine during pregnancy. Try to exercise for 30 minutes at least 5 days a week. Stop exercising if you experience uterine contractions. Avoid heavy lifting, wear low heel shoes, and practice good posture. A sexual relationship may be continued unless your health care provider directs you otherwise. Relieving pain and discomfort Wear a good support bra to prevent discomfort from breast tenderness. Take warm sitz baths to soothe any pain or discomfort caused by hemorrhoids. Use hemorrhoid cream if your health care provider approves. Rest with your legs elevated if you have leg cramps or low back pain. If you develop varicose veins, wear support hose. Elevate your feet for 15 minutes, 3-4 times a day. Limit salt in your diet. Prenatal Care Write down your questions. Take them to your prenatal visits. Keep all your prenatal visits as told by your health  care provider. This is important. Safety Wear your seat belt at all times when driving. Make a list of emergency phone numbers, including numbers for family, friends, the hospital, and police and fire departments. General instructions Ask your health care provider for a referral to a local prenatal education class. Begin classes no later than the beginning of month 6 of your pregnancy. Ask for help if you have counseling or nutritional needs during pregnancy. Your health care provider can offer advice or refer you to specialists for help with various needs. Do not use hot tubs, steam rooms, or saunas. Do not douche or use tampons or scented sanitary pads. Do not cross your legs for long periods of time. Avoid cat litter boxes and soil used by cats. These carry germs that can cause birth defects in the baby and possibly loss of the  fetus by miscarriage or stillbirth. Avoid all smoking, herbs, alcohol, and unprescribed drugs. Chemicals in these products can affect the formation and growth of the baby. Do not use any products that contain nicotine or tobacco, such as cigarettes and e-cigarettes. If you need help quitting, ask your health care provider. Visit your dentist if you have not gone yet during your pregnancy. Use a soft toothbrush to brush your teeth and be gentle when you floss. Contact a health care provider if: You have dizziness. You have mild pelvic cramps, pelvic pressure, or nagging pain in the abdominal area. You have persistent nausea, vomiting, or diarrhea. You have a bad smelling vaginal discharge. You have pain when you urinate. Get help right away if: You have a fever. You are leaking fluid from your vagina. You have spotting or bleeding from your vagina. You have severe abdominal cramping or pain. You have rapid weight gain or weight loss. You have shortness of breath with chest pain. You notice sudden or extreme swelling of your face, hands, ankles, feet, or legs. You  have not felt your baby move in over an hour. You have severe headaches that do not go away when you take medicine. You have vision changes. Summary The second trimester is from week 14 through week 27 (months 4 through 6). It is also a time when the fetus is growing rapidly. Your body goes through many changes during pregnancy. The changes vary from woman to woman. Avoid all smoking, herbs, alcohol, and unprescribed drugs. These chemicals affect the formation and growth your baby. Do not use any tobacco products, such as cigarettes, chewing tobacco, and e-cigarettes. If you need help quitting, ask your health care provider. Contact your health care provider if you have any questions. Keep all prenatal visits as told by your health care provider. This is important. This information is not intended to replace advice given to you by your health care provider. Make sure you discuss any questions you have with your health care provider. Document Released: 11/25/2001 Document Revised: 05/08/2016 Document Reviewed: 02/01/2013 Elsevier Interactive Patient Education  2017 [employer].

## 2025-01-20 LAB — AFP, SERUM, OPEN SPINA BIFIDA
AFP MoM: 0.84
AFP Value: 24.4 ng/mL
Gest. Age on Collection Date: 16 wk
Maternal Age At EDD: 29.6 a
OSBR Risk 1 IN: 10000
Test Results:: NEGATIVE
Weight: 183 [lb_av]

## 2025-01-20 LAB — URINE CULTURE

## 2025-01-20 LAB — CYTOLOGY - PAP
Comment: NEGATIVE
Diagnosis: NEGATIVE
High risk HPV: NEGATIVE

## 2025-02-15 ENCOUNTER — Encounter: Admitting: Women's Health

## 2025-02-15 ENCOUNTER — Other Ambulatory Visit: Admitting: Radiology
